# Patient Record
Sex: Male | Born: 1955 | Race: Black or African American | Hispanic: No | Marital: Married | Smoking: Never smoker
Health system: Southern US, Community
[De-identification: ages and names within clinical notes are randomized; demographics above are authoritative.]

## PROBLEM LIST (undated history)

## (undated) DIAGNOSIS — E119 Type 2 diabetes mellitus without complications: Secondary | ICD-10-CM

## (undated) DIAGNOSIS — I10 Essential (primary) hypertension: Secondary | ICD-10-CM

## (undated) DIAGNOSIS — N189 Chronic kidney disease, unspecified: Secondary | ICD-10-CM

## (undated) DIAGNOSIS — F329 Major depressive disorder, single episode, unspecified: Secondary | ICD-10-CM

## (undated) DIAGNOSIS — E785 Hyperlipidemia, unspecified: Secondary | ICD-10-CM

## (undated) DIAGNOSIS — I639 Cerebral infarction, unspecified: Secondary | ICD-10-CM

## (undated) DIAGNOSIS — F32A Depression, unspecified: Secondary | ICD-10-CM

## (undated) HISTORY — DX: Essential (primary) hypertension: I10

## (undated) HISTORY — DX: Major depressive disorder, single episode, unspecified: F32.9

## (undated) HISTORY — PX: BACK SURGERY: SHX140

## (undated) HISTORY — DX: Depression, unspecified: F32.A

## (undated) HISTORY — DX: Chronic kidney disease, unspecified: N18.9

---

## 2000-10-29 ENCOUNTER — Inpatient Hospital Stay (HOSPITAL_COMMUNITY): Admission: EM | Admit: 2000-10-29 | Discharge: 2000-10-31 | Payer: Self-pay | Admitting: Emergency Medicine

## 2002-07-16 ENCOUNTER — Emergency Department (HOSPITAL_COMMUNITY): Admission: EM | Admit: 2002-07-16 | Discharge: 2002-07-16 | Payer: Self-pay | Admitting: Emergency Medicine

## 2005-01-20 ENCOUNTER — Ambulatory Visit: Payer: Self-pay | Admitting: Internal Medicine

## 2005-03-07 ENCOUNTER — Ambulatory Visit: Payer: Self-pay | Admitting: Internal Medicine

## 2005-03-31 ENCOUNTER — Ambulatory Visit: Payer: Self-pay | Admitting: Internal Medicine

## 2005-04-18 ENCOUNTER — Encounter: Admission: RE | Admit: 2005-04-18 | Discharge: 2005-07-17 | Payer: Self-pay | Admitting: Internal Medicine

## 2005-05-06 ENCOUNTER — Ambulatory Visit: Payer: Self-pay | Admitting: Internal Medicine

## 2005-09-15 ENCOUNTER — Ambulatory Visit: Payer: Self-pay | Admitting: Internal Medicine

## 2006-06-16 ENCOUNTER — Encounter: Payer: Self-pay | Admitting: Internal Medicine

## 2006-06-16 ENCOUNTER — Ambulatory Visit: Payer: Self-pay | Admitting: Internal Medicine

## 2006-09-14 ENCOUNTER — Ambulatory Visit: Payer: Self-pay | Admitting: Internal Medicine

## 2006-09-14 LAB — CONVERTED CEMR LAB
BUN: 23 mg/dL (ref 6–23)
Cholesterol: 208 mg/dL (ref 0–200)
Total CHOL/HDL Ratio: 4.7
Triglycerides: 236 mg/dL (ref 0–149)

## 2007-01-09 ENCOUNTER — Encounter: Payer: Self-pay | Admitting: Internal Medicine

## 2007-02-20 ENCOUNTER — Encounter: Payer: Self-pay | Admitting: Internal Medicine

## 2007-02-20 ENCOUNTER — Ambulatory Visit: Payer: Self-pay | Admitting: Internal Medicine

## 2007-03-21 ENCOUNTER — Ambulatory Visit: Payer: Self-pay | Admitting: Internal Medicine

## 2007-03-25 LAB — CONVERTED CEMR LAB
ALT: 37 units/L (ref 0–53)
AST: 31 units/L (ref 0–37)
Creatinine, Ser: 1.3 mg/dL (ref 0.4–1.5)
Hgb A1c MFr Bld: 6.3 % — ABNORMAL HIGH (ref 4.6–6.0)
Total CHOL/HDL Ratio: 5.7
VLDL: 38 mg/dL (ref 0–40)

## 2007-03-26 ENCOUNTER — Encounter (INDEPENDENT_AMBULATORY_CARE_PROVIDER_SITE_OTHER): Payer: Self-pay | Admitting: *Deleted

## 2007-06-04 ENCOUNTER — Ambulatory Visit: Payer: Self-pay | Admitting: Internal Medicine

## 2007-06-04 ENCOUNTER — Encounter: Payer: Self-pay | Admitting: Internal Medicine

## 2007-10-03 ENCOUNTER — Ambulatory Visit: Payer: Self-pay | Admitting: Internal Medicine

## 2007-10-03 ENCOUNTER — Encounter: Payer: Self-pay | Admitting: Internal Medicine

## 2007-10-03 LAB — CONVERTED CEMR LAB: LDL Goal: 100 mg/dL

## 2007-10-05 ENCOUNTER — Encounter (INDEPENDENT_AMBULATORY_CARE_PROVIDER_SITE_OTHER): Payer: Self-pay | Admitting: *Deleted

## 2007-10-05 ENCOUNTER — Telehealth (INDEPENDENT_AMBULATORY_CARE_PROVIDER_SITE_OTHER): Payer: Self-pay | Admitting: *Deleted

## 2007-10-05 LAB — CONVERTED CEMR LAB
Creatinine, Ser: 1.4 mg/dL (ref 0.4–1.5)
Hgb A1c MFr Bld: 6.5 % — ABNORMAL HIGH (ref 4.6–6.0)
Potassium: 3.7 meq/L (ref 3.5–5.1)

## 2007-10-09 ENCOUNTER — Encounter (INDEPENDENT_AMBULATORY_CARE_PROVIDER_SITE_OTHER): Payer: Self-pay | Admitting: *Deleted

## 2007-10-18 ENCOUNTER — Ambulatory Visit (HOSPITAL_COMMUNITY): Admission: RE | Admit: 2007-10-18 | Discharge: 2007-10-18 | Payer: Self-pay | Admitting: Internal Medicine

## 2007-10-24 ENCOUNTER — Ambulatory Visit: Payer: Self-pay | Admitting: Cardiology

## 2007-10-29 ENCOUNTER — Encounter: Payer: Self-pay | Admitting: Pulmonary Disease

## 2007-11-05 ENCOUNTER — Ambulatory Visit (HOSPITAL_BASED_OUTPATIENT_CLINIC_OR_DEPARTMENT_OTHER): Admission: RE | Admit: 2007-11-05 | Discharge: 2007-11-05 | Payer: Self-pay | Admitting: *Deleted

## 2007-11-09 ENCOUNTER — Ambulatory Visit: Payer: Self-pay

## 2007-11-09 ENCOUNTER — Encounter: Payer: Self-pay | Admitting: Internal Medicine

## 2007-11-13 ENCOUNTER — Ambulatory Visit: Payer: Self-pay | Admitting: Pulmonary Disease

## 2007-11-19 ENCOUNTER — Ambulatory Visit (HOSPITAL_BASED_OUTPATIENT_CLINIC_OR_DEPARTMENT_OTHER): Admission: RE | Admit: 2007-11-19 | Discharge: 2007-11-19 | Payer: Self-pay | Admitting: General Surgery

## 2007-11-19 ENCOUNTER — Encounter (INDEPENDENT_AMBULATORY_CARE_PROVIDER_SITE_OTHER): Payer: Self-pay | Admitting: General Surgery

## 2007-11-20 ENCOUNTER — Encounter: Payer: Self-pay | Admitting: Internal Medicine

## 2007-12-11 ENCOUNTER — Ambulatory Visit: Payer: Self-pay | Admitting: Pulmonary Disease

## 2007-12-11 ENCOUNTER — Encounter: Payer: Self-pay | Admitting: Pulmonary Disease

## 2007-12-13 ENCOUNTER — Ambulatory Visit: Payer: Self-pay | Admitting: Cardiology

## 2008-01-10 ENCOUNTER — Ambulatory Visit: Payer: Self-pay | Admitting: Pulmonary Disease

## 2008-01-10 ENCOUNTER — Encounter: Payer: Self-pay | Admitting: Pulmonary Disease

## 2008-03-11 ENCOUNTER — Ambulatory Visit: Payer: Self-pay | Admitting: Cardiology

## 2008-07-10 ENCOUNTER — Ambulatory Visit: Payer: Self-pay | Admitting: Internal Medicine

## 2008-07-10 ENCOUNTER — Encounter (INDEPENDENT_AMBULATORY_CARE_PROVIDER_SITE_OTHER): Payer: Self-pay | Admitting: *Deleted

## 2008-07-15 ENCOUNTER — Encounter (INDEPENDENT_AMBULATORY_CARE_PROVIDER_SITE_OTHER): Payer: Self-pay | Admitting: *Deleted

## 2008-07-15 LAB — CONVERTED CEMR LAB
Creatinine,U: 201.9 mg/dL
Hgb A1c MFr Bld: 6.6 % — ABNORMAL HIGH (ref 4.6–6.0)
Microalb Creat Ratio: 26.3 mg/g (ref 0.0–30.0)
Microalb, Ur: 5.3 mg/dL — ABNORMAL HIGH (ref 0.0–1.9)
Potassium: 3.8 meq/L (ref 3.5–5.1)

## 2008-10-06 ENCOUNTER — Telehealth: Payer: Self-pay | Admitting: Family Medicine

## 2009-03-20 ENCOUNTER — Encounter (INDEPENDENT_AMBULATORY_CARE_PROVIDER_SITE_OTHER): Payer: Self-pay | Admitting: *Deleted

## 2009-04-10 ENCOUNTER — Ambulatory Visit: Payer: Self-pay | Admitting: Internal Medicine

## 2009-04-10 ENCOUNTER — Encounter: Payer: Self-pay | Admitting: Internal Medicine

## 2009-04-10 LAB — CONVERTED CEMR LAB: Potassium: 3.8 meq/L (ref 3.5–5.1)

## 2009-04-13 ENCOUNTER — Encounter (INDEPENDENT_AMBULATORY_CARE_PROVIDER_SITE_OTHER): Payer: Self-pay | Admitting: *Deleted

## 2009-08-17 ENCOUNTER — Emergency Department (HOSPITAL_COMMUNITY): Admission: EM | Admit: 2009-08-17 | Discharge: 2009-08-17 | Payer: Self-pay | Admitting: Emergency Medicine

## 2010-07-30 ENCOUNTER — Telehealth (INDEPENDENT_AMBULATORY_CARE_PROVIDER_SITE_OTHER): Payer: Self-pay | Admitting: *Deleted

## 2010-08-12 NOTE — Progress Notes (Signed)
Summary: clonidine refill   Phone Note Refill Request Message from:  Fax from Pharmacy on July 30, 2010 10:14 AM  Refills Requested: Medication #1:  CLONIDINE 0.1 MG TAB   Notes: Take one tablet by mouth twice daily [id number], [address] [place], [place], [place]  phone--not listed, Fax  = [phone]   qty =60     ****NOTE****   this fax was originally sent to Dr Rollene Rotunda and then the prescription was forwarded to our facility  Next Appointment Scheduled: Fri 2/3  Alwyn Ren Initial call taken by: Jerolyn Shin,  July 30, 2010 10:17 AM  Follow-up for Phone Call        Left message on machine for patient to return call when avaliable, Reason for call:   Clonidine not on med list, ? if he is taking Follow-up by: Shonna Chock CMA,  July 30, 2010 10:22 AM  Additional Follow-up for Phone Call Additional follow up Details #1::        Left message on machine for patient to return call when avaliable, Reason for call:  Discuss refill request Additional Follow-up by: Shonna Chock CMA,  July 30, 2010 4:47 PM    Additional Follow-up for Phone Call Additional follow up Details #2::    Patient called to indicate this was requested in error and he is no longer taking med. I called the pharmacy and left message on voicemail informing them to remove med from patient's list Follow-up by: Shonna Chock CMA,  August 02, 2010 8:12 AM  fr

## 2010-08-13 ENCOUNTER — Encounter: Payer: Self-pay | Admitting: Internal Medicine

## 2010-08-13 ENCOUNTER — Ambulatory Visit (INDEPENDENT_AMBULATORY_CARE_PROVIDER_SITE_OTHER): Payer: PRIVATE HEALTH INSURANCE | Admitting: Internal Medicine

## 2010-08-13 ENCOUNTER — Ambulatory Visit: Admit: 2010-08-13 | Payer: Self-pay | Admitting: Internal Medicine

## 2010-08-13 DIAGNOSIS — I10 Essential (primary) hypertension: Secondary | ICD-10-CM

## 2010-08-13 DIAGNOSIS — E119 Type 2 diabetes mellitus without complications: Secondary | ICD-10-CM

## 2010-08-16 LAB — CONVERTED CEMR LAB
Creatinine, Ser: 1.66 mg/dL — ABNORMAL HIGH (ref 0.40–1.50)
Hgb A1c MFr Bld: 6.1 % — ABNORMAL HIGH (ref <5.7)

## 2010-08-18 NOTE — Assessment & Plan Note (Signed)
Summary: MED REFILL/DIDN'T WANT TO SCHEDULE CPX/WANTED APPT. ASAP/RESCH F   Vital Signs:  Patient profile:   55 year old male Weight:      272.2 pounds BMI:     34.15 Pulse rate:   72 / minute Resp:     15 per minute BP sitting:   158 / 100  (left arm) Cuff size:   large  Vitals Entered By: Shonna Chock CMA (August 13, 2010 3:07 PM) CC: 1.) Renew RX's, patient stopped Carvedilol (too strong)  2.) Right hand/shoulder pain x 6 months or longer, Type 2 diabetes mellitus follow-up   Primary Care Provider:  Dr Alwyn Ren  CC:  1.) Renew RX's, patient stopped Carvedilol (too strong)  2.) Right hand/shoulder pain x 6 months or longer, and Type 2 diabetes mellitus follow-up.  History of Present Illness: Hypertension Follow-Up      This is a 55 year old man who presents for Hypertension follow-up.  The patient reports lightheadedness, urinary frequency, headaches, and fatigue, but denies edema.  The patient denies the following associated symptoms: exertional chest pain, exercise intolerance, dyspnea, palpitations, and syncope.  The patient reports that dietary compliance has been good.  The patient reports exercising 2 X per week.  Adjunctive measures currently used by the patient include salt restriction.   BP not checked @ home. He had not been been taken Carvedilol ; "it was too strong for me". Type 2 Diabetes Mellitus Follow-Up      The patient is also here for Type 2 diabetes mellitus follow-up.  The patient reports numbness of  toes occasionally , but denies polydipsia, blurred vision, self managed hypoglycemia, weight loss, and weight gain.  The patient denies the following symptoms: neuropathic pain, vomiting, poor wound healing, intermittent claudication, vision loss, and foot ulcer.  Since the last visit the patient reports not monitoring blood glucose.  Since the last visit, the patient reports having had no eye care.    Current Medications (verified): 1)  Hydrochlorothiazide 25 Mg Tabs  (Hydrochlorothiazide) .... Take One Half Daily **appointment Due** 2)  Fluoxetine Hcl 20 Mg  Caps (Fluoxetine Hcl) .Marland Kitchen.. 1 in The Morning 3)  Metformin Hcl 500 Mg  Tabs (Metformin Hcl) .Marland Kitchen.. 1 Daily 4)  Benazepril Hcl 40 Mg  Tabs (Benazepril Hcl) .... **appointment Due Now** 1 By Mouth Once Daily  Allergies (verified): No Known Drug Allergies  Past History:  Past Medical History: Chest pain 2002; negative  for MI Nonspecific Abnormal EKG Hypertension Depressive Disorder Abnormal Blood Chemistry Diabetes Sleep Apnea  Social History: no diet Patient never smoked.  Pt is married with children.  Pt working as Customer service manager   Physical Exam  General:  well-nourished,in no acute distress; alert,appropriate and cooperative throughout examination Eyes:  No corneal or conjunctival inflammation noted. Perrla. Funduscopic exam benign, without hemorrhages, exudates or papilledema.Arteriolar narrowing Heart:  Normal rate and regular rhythm. S1 and S2 normal without gallop, murmur, click, rub. S4 Abdomen:  Bowel sounds positive,abdomen soft and non-tender without masses, organomegaly or hernias noted. No AAA or bruits Pulses:  R and L carotid,radial,dorsalis pedis and posterior tibial pulses are full and equal bilaterally Extremities:  No clubbing, cyanosis, edema. Chronic toenail changes Neurologic:  alert & oriented X3 and sensation   to light touch decreased R foot Skin:  Intact without suspicious lesions or rashes Psych:  memory intact for recent and remote, normally interactive, and good eye contact.   Quiet & polite   Impression & Recommendations:  Problem # 1:  HYPERTENSION (ICD-401.9)  uncontrolled The following medications were removed from the medication list:    Carvedilol 25 Mg Tabs (Carvedilol) .Marland Kitchen... 1 bid His updated medication list for this problem includes:    Hydrochlorothiazide 25 Mg Tabs (Hydrochlorothiazide) .Marland Kitchen... Take one half daily     Benazepril Hcl 40 Mg Tabs  (Benazepril hcl) .Marland Kitchen... 1 by mouth once daily    Carvedilol 25 Mg Tabs (Carvedilol) .Marland Kitchen..Marland Kitchen Two times a day as directe ; goal = bp < 135/85  Orders: Venipuncture (78295) TLB-Creatinine, Blood (82565-CREA) TLB-Potassium (K+) (84132-K) TLB-BUN (Urea Nitrogen) (84520-BUN)  Problem # 2:  DIABETES MELLITUS (ICD-250.00)  His updated medication list for this problem includes:    Metformin Hcl 500 Mg Tabs (Metformin hcl) .Marland Kitchen... 1 daily    Benazepril Hcl 40 Mg Tabs (Benazepril hcl) .Marland Kitchen... 1 by mouth once daily  Orders: TLB-A1C / Hgb A1C (Glycohemoglobin) (83036-A1C)  Complete Medication List: 1)  Hydrochlorothiazide 25 Mg Tabs (Hydrochlorothiazide) .... Take one half daily **appointment due** 2)  Fluoxetine Hcl 20 Mg Caps (Fluoxetine hcl) .Marland Kitchen.. 1 in the morning 3)  Metformin Hcl 500 Mg Tabs (Metformin hcl) .Marland Kitchen.. 1 daily 4)  Benazepril Hcl 40 Mg Tabs (Benazepril hcl) .Marland Kitchen.. 1 by mouth once daily 5)  Carvedilol 25 Mg Tabs (Carvedilol) .... Two times a day as directe ; goal = bp < 135/85  Patient Instructions: 1)  Refill  of HCTZ & Metformin will depend on lab results. Start with 1/4 pill of  Carvedilol 25 mg two times a day . 2)  See your eye doctor yearly to check for diabetic eye damage. 3)  Check your feet each night for sore areas, calluses or signs of infection. 4)  Check your Blood Pressure regularly. If it is above: 135/85 ON AVERAGE  you should  increase Carvedilol to 1/2 two times a day . Prescriptions: CARVEDILOL 25 MG TABS (CARVEDILOL) two times a day as directe ; goal = BP < 135/85  #180 x 0   Entered and Authorized by:   Marga Melnick MD   Signed by:   Marga Melnick MD on 08/13/2010   Method used:   Print then Give to Patient   RxID:   [id number] BENAZEPRIL HCL 40 MG  TABS (BENAZEPRIL HCL) 1 by mouth once daily  #90 x 3   Entered and Authorized by:   Marga Melnick MD   Signed by:   Marga Melnick MD on 08/13/2010   Method used:   Print then Give to Patient   RxID:    [id number] FLUOXETINE HCL 20 MG  CAPS (FLUOXETINE HCL) 1 in the morning  #90 x 3   Entered and Authorized by:   Marga Melnick MD   Signed by:   Marga Melnick MD on 08/13/2010   Method used:   Print then Give to Patient   RxID:   [id number]    Orders Added: 1)  Est. Patient Level III [87564] 2)  Venipuncture [33295] 3)  TLB-Creatinine, Blood [82565-CREA] 4)  TLB-Potassium (K+) [84132-K] 5)  TLB-BUN (Urea Nitrogen) [84520-BUN] 6)  TLB-A1C / Hgb A1C (Glycohemoglobin) [83036-A1C]  Appended Document: MED REFILL/DIDN'T WANT TO SCHEDULE CPX/WANTED APPT. ASAP/RESCH F

## 2010-11-23 NOTE — Assessment & Plan Note (Signed)
[facility]                            CARDIOLOGY OFFICE NOTE   ARDEN, TINOCO                   MRN:          [id number]  DATE:03/11/2008                            DOB:          [birth year]    PRIMARY CARE PHYSICIAN:  Titus Dubin. Alwyn Ren, MD, FACP, FCCP   REASON FOR PRESENTATION:  Evaluate the patient with difficult-to-control  hypertension.   HISTORY OF PRESENT ILLNESS:  The patient is 55 years old.  He presents  for followup of the above.  Since I last saw him, he has been using a  CPAP and thinks it has made a great deal of difference.  He has more  energy and sleeps better.  He is not having any new cardiovascular  symptoms.  He has no chest discomfort, neck or arm discomfort.  He does  not have any palpitations, presyncope or syncope.  He has unfortunately  not been dieting and has actually gained weight since I last saw him.  He does get some reflux of acid, but no other chest discomfort.   PAST MEDICAL HISTORY:  Hypertension x7 years, diabetes mellitus x1 year,  severe sleep apnea newly diagnosed, lipoma resected.   ALLERGIES:  None.   MEDICATIONS:  1. Fluoxetine 20 mg daily.  2. Metformin 500 mg daily.  3. Coreg 25 mg b.i.d.  4. Benazepril 40 mg daily.  5. Hydrochlorothiazide 12.5 mg daily.   REVIEW OF SYSTEMS:  As stated in the HPI and otherwise negative for  other systems.   PHYSICAL EXAMINATION:  GENERAL:  The patient is in no distress.  VITAL SIGNS:  Her blood pressure 158/106, heart rate 72 and regular, and  weight 289 pounds.  HEENT:  Eyes unremarkable; pupils equal, round and reactive to light;  fundi not visualized; oral mucosa unremarkable.  NECK:  No jugular venous distension at 45 degrees; carotid upstroke  brisk and symmetrical; no bruits, no thyromegaly.  LYMPHATICS:  No cervical, axillary or inguinal adenopathy.  LUNGS:  Clear to auscultation bilaterally.  BACK:  No costovertebral angle tenderness.  CHEST:   Unremarkable.  HEART:  PMI not displaced or sustained; S1 and S2 within normal limits;  no S3, no S4; no clicks, no rubs, no murmurs.  ABDOMEN:  Obese; positive bowel sounds, normal in frequency and pitch;  no bruits, no rebound, no guarding; no midline pulsatile mass, no  hepatosplenomegaly, no splenomegaly.  SKIN: No rashes, no nodules.  EXTREMITIES:  2+ pulses, no edema.   EKG sinus rhythm, rate 68, axis within normal limits, intervals within  normal limits, no acute ST-T wave changes.   ASSESSMENT AND PLAN:  1. Hypertension.  The patient's blood pressure is better, but still      not at target.  We have discussed therapeutic lifestyle changes      (TLC).  I am going to restart clonidine at 0.1 mg b.i.d. in      addition to his other medications.  I have reviewed his labs, and      he has had normal BUN and creatinine and it does not appear to be a  secondary cause for his hypertension.  2. Diabetes, per Dr. Alwyn Ren.  3. Sleep apnea.  The patient is on CPAP and continues to have this      titrated.  I am delighted that this is making him feel better.      Weight loss will also help.  4. Obesity.  The patient and I discussed weight loss.  I have given      him instructions to cut out potatoes, breads, and Second Helpings.      He needs to start exercising more and he does have a bicycle that      he brought and he is starting to peddle it in his home.  5. Followup.  I will see him back in about 6 weeks to follow up on his      blood pressure.     Rollene Rotunda, MD, [facility]  Electronically Signed    JH/MedQ  DD: 03/11/2008  DT: 03/11/2008  Job #: [id number]   cc:   Titus Dubin. Alwyn Ren, MD,FACP,FCCP

## 2010-11-23 NOTE — Assessment & Plan Note (Signed)
[facility]                            CARDIOLOGY OFFICE NOTE   [name], GATLEY                   MRN:          [id number]  DATE:10/24/2007                            DOB:          1956-01-21    PRIMARY:  Chris Vaughn. Alwyn Ren, MD.   REASON FOR PRESENTATION:  Evaluate patient with chest pain and difficult  to control hypertension.   HISTORY OF PRESENT ILLNESS:  The patient is a pleasant, 55 year old  African-American gentleman without prior cardiac history.  He did  recently begin to complain of some chest discomfort.  He was actually  set up to have an exercise stress test, but his blood pressure was 105  diastolic, and so this did not take place.  Instead he is referred here  for evaluation of his chest pain, and his difficult-to-control  hypertension.   The patient says that he has been having chest pain for quite awhile.  However, it is rarely happening now.  It was happening a few times a  week.  He does not remember the last episode.  He points to his left  upper chest.  He gets this discomfort if he walks a lot at work, or does  have any heavy lifting.  It may rarely happen at rest.  He says it is  somewhat sharp.  He does not describe any radiation into his neck or  into his arms.  There is no associated nausea, vomiting, or diaphoresis.  He has had no palpitation, presyncope, or syncope.  He does not think  that this was like any previous complaints that he has had before.  It  may last 3-4 minutes before going away spontaneously.  He will  occasionally get short of breath.  This may happen with activity such as  mowing the lawn.  It may happen occasionally at rest, though he is not  describing PND or orthopnea.  Of note, the patient does snore loudly.  His wife thinks that he has apneic episodes.  He reports daytime  sleepiness and occasional headaches.  He has had difficult-to-control  hypertension.   PAST MEDICAL HISTORY:   Diabetes mellitus x1 year, hypertension x7 years.   PAST SURGICAL HISTORY:  Fatty tumor removed.   ALLERGIES:  None.   MEDICATIONS:  1. Hydrochlorothiazide 12.5 mg daily.  2. Clonidine 0.2 mg b.i.d.  3. Metformin 500 mg daily.  4. Fluoxetine.  5. Benazepril 40 mg daily.   SOCIAL HISTORY:  The patient is married.  He has 3 children.  He does  not smoke cigarettes or drink alcohol.   FAMILY HISTORY:  Noncontributory for early coronary disease.  He thinks  his mom had heart disease in her 36s, and finally died of a heart attack  at age 48.   REVIEW OF SYSTEMS:  As stated in the HPI.  Negative for all other  systems.   PHYSICAL EXAMINATION:  The patient is pleasant and in no distress.  Blood pressure 149/105, heart rate 75 regular.  Weight 284 pounds, body  mass index 35.  HEENT:  Eyelids unremarkable.  Pupils equal, round, and react to  light.  Fundi within normal limits, oral mucosa unremarkable, macroglossia.  NECK:  No jugular distention at 45 degrees.  Carotid upstroke brisk and  symmetric.  No bruits, no thyromegaly.  LYMPHATICS:  No cervical, axillary, or inguinal adenopathy.  LUNGS:  Clear to auscultation bilaterally.  BACK:  No costovertebral angle tenderness.  CHEST:  Unremarkable.  HEART:  PMI not displaced or sustained.  S1 and S2 within normal limits.  No S3, no S4, no clicks, no rubs, no murmurs.  ABDOMEN:  Obese, positive bowel sounds normal in frequency and pitch.  No bruits, no rebound, no guarding, or midline pulsatile mass.  No  hepatomegaly or splenomegaly.  SKIN:  No rashes, no nodules.  EXTREMITIES:  2+ pulse throughout.  No edema, no cyanosis, no clubbing.  NEURO:  Oriented to person, place, and time.  Cranial nerves II-XII  grossly intact.  Motor grossly intact.   EKG:  Sinus rhythm, left ventricle hypertrophy by voltage criteria, axis  within normal limits, intervals within normal limits, no acute ST-wave  changes.   ASSESSMENT/PLAN:  1. Chest.   The patient's chest pain is somewhat atypical; however, he      has significant cardiovascular risk factors.  I would like for him      to have a stress perfusion study as the pretest probability of      obstructive coronary disease is somewhat moderate.  I think that he      would be able to walk on a treadmill, if his blood pressure allows.      Otherwise, we will schedule this for adenosine.  2. Hypertension.  His blood pressure is difficult to control.  We      reviewed therapeutic lifestyle changes (TLC).  This should include      weight loss and salt restriction, and increased activity.  For now,      I am also going to increase his clonidine to 0.3 mg b.i.d..      Finally, I have very much believe that he has sleep apnea.  We will      do a sleep study.  Between therapeutic lifestyle changes, and      treatment of sleep apnea, we may be able to cut back on medicines      going forward.  3. Obesity.  We discussed the need to lose weight with diet and      exercise.  4. Diabetes mellitus per Dr. Alwyn Ren.  5. Risk reduction.  The patient should be on a statin.  I will take      the liberty of starting pravastatin 20 mg daily.  The goal would be      an LDL of less than 100 and HDL greater than 40.  We can repeat a      lipid profile going forward.  6. Followup.  I would like to see him back in about a month, and      hopefully he will have the sleep study and the nuclear study by      then.     Rollene Rotunda, MD, [facility]  Electronically Signed    JH/MedQ  DD: 10/24/2007  DT: 10/24/2007  Job #: [id number]   cc:   Chris Vaughn. Alwyn Ren, MD,FACP,FCCP

## 2010-11-23 NOTE — Op Note (Signed)
[name], Chris Vaughn            ACCOUNT NO.:  [id number]   MEDICAL RECORD NO.:  [id number]          PATIENT TYPE:  AMB   LOCATION:  DSC                          FACILITY:  [facility]   PHYSICIAN:  Chris Dare. Janee Vaughn, M.D.DATE OF BIRTH:  16-Aug-1955   DATE OF PROCEDURE:  11/19/2007  DATE OF DISCHARGE:                               OPERATIVE REPORT   PREOPERATIVE DIAGNOSIS:  Mass, right back, 10 cm.   POSTOPERATIVE DIAGNOSIS:  Mass, right back, 10 cm.   PROCEDURE:  Excision of mass, right back, 10 cm with layered closure.   SURGEON:  Chris Dare. Janee Vaughn, M.D.   ANESTHESIA:  General.   HISTORY OF PRESENT ILLNESS:  The patient is a 55 year old African  American gentleman who I evaluated in the office for an enlarging  symptomatic 10-cm mass on his right upper back.  He has a history of a  previous large lipoma excision from a different portion of his back in  the past.  This mass has gotten larger, and he presents today for  elective excision.   PROCEDURE IN DETAIL:  Informed consent was obtained.  The patient's site  was marked after identifying him in the preop holding area.  He received  intravenous antibiotics.  He was brought to the operating room.  General  endotracheal anesthesia was administered by the anesthesia staff.  He  was then placed in prone position while carefully protecting his  endotracheal tube positioning.  His back was prepped and draped in a  sterile fashion.  A 0.25% Marcaine with epinephrine was injected over  this palpable 10-cm mass.  An oblique incision along the tissue planes  was made over the top of the mass.  Subcutaneous tissues were dissected  down, and this revealed a mostly encapsulated fatty tumor.  This was  circumferentially dissected from the subcutaneous tissues and then off  the fascia underneath it, appeared to be sitting right on top of the  fascia.  Dissection was continued circumferentially to remove the entire  mass, which was sent to  pathology.  Meticulous hemostasis was then  obtained in the wound bed.  Some additional local anesthetic was  injected.  The area was copiously irrigated.  It was again checked for  hemostasis, which was present.  The wound was then closed in layers with  subcutaneous tissues, closed with interrupted 2-0 Vicryl sutures,  tacking these tissues down to the underlying fascia.  The skin was then  closed with running 4-0 Monocryl subcuticular stitch.  The wound was  then dressed with Dermabond.  Sponge, needle, and instrument counts were  correct.  The patient tolerated the procedure well without apparent  complication and was taken to recovery room in stable condition.     Chris Vaughn, M.D.  Electronically Signed    BET/MEDQ  D:  11/19/2007  T:  11/20/2007  Job:  [id number]   cc:   Chris Dubin. Alwyn Ren, MD,FACP,FCCP

## 2010-11-23 NOTE — Procedures (Signed)
Chris Vaughn, Chris Vaughn NO.:  [id number]   MEDICAL RECORD NO.:  [id number]          PATIENT TYPE:  OUT   LOCATION:  SLEEP CENTER                 FACILITY:  [facility]   PHYSICIAN:  Barbaraann Share, MD,FCCPDATE OF BIRTH:  11-[birth year]   DATE OF STUDY:  11/05/2007                            NOCTURNAL POLYSOMNOGRAM   REFERRING PHYSICIAN:  Rollene Rotunda, MD, [facility]   INDICATION FOR STUDY:  Hypersomnia with sleep apnea.   EPWORTH SLEEPINESS SCORE:  Is 16.   MEDICATIONS:   SLEEP ARCHITECTURE:  The patient had a total sleep time of 121 minutes  during the diagnostic portion of the study and 141 minutes during the  titration portion.  There was no slow wave sleep and only minimal REM  achieved during the entire night.  Sleep onset latency was normal at 22  minutes and REM was not achieved until the titration portion of the  study.  Sleep efficiency was 63% during the diagnostic portion and 56%  during the titration portion.   RESPIRATORY DATA:  The patient underwent a split night study where he  was found to have 157 obstructive events in the first 121 minutes of  sleep.  This gave him an apnea to hypopnea index of 78 events per hour  during the diagnostic portion of the study.  He was noted to have loud  snoring, and the events were not positional.  By protocol he was placed  on a large Quattro full face mask and titrated as high as 9 cm of H2O  pressure.  It should be noted that the patient had fairly good control  of his events on 7 cm, and had induction of central apneas at both 8 and  9 cm; therefore I would recommend the treatment pressure of 7 cm of H2O.   OXYGEN DATA:  There was O2 desaturation as low as 76% during the  diagnostic portion of the study.  This responded quite well to optimal  CPAP.   CARDIAC DATA:  No clinically significant cardiac arrhythmias were noted.   MOVEMENT/PARASOMNIA:  None.   IMPRESSIONS/RECOMMENDATIONS:  Split night study revealed  severe  obstructive sleep apnea/hypopnea syndrome with an apnea to hypopnea  index of 78 events per hour and O2 desaturation as low as 76% during the  diagnostic portion of the study.  The patient was then placed on CPAP  with a large Quattro full face mask, and his  optimal pressure was found to be 7 cm of H2O.  The patient should also  be encouraged to work aggressively on weight loss.      Barbaraann Share, MD,FCCP  Diplomate, American Board of Sleep  Medicine  Electronically Signed     KMC/MEDQ  D:  11/13/2007 17:30:21  T:  11/13/2007 17:57:00  Job:  [id number]

## 2010-11-23 NOTE — Assessment & Plan Note (Signed)
[facility]                            CARDIOLOGY OFFICE NOTE   KAYLEE, WOMBLES                   MRN:          [id number]  DATE:12/13/2007                            DOB:          [birth year]    PRIMARY CARE PHYSICIAN:  Titus Dubin. Alwyn Ren, MD, FACP, FCCP   REASON FOR PRESENTATION:  Evaluate patient with difficult to control  hypertension.   HISTORY OF PRESENT ILLNESS:  The patient is a pleasant 55 year old  gentleman with hypertension that has been difficult to control.  When I  last saw him, I increased his clonidine.  I also sent him for sleep  study.  He was found to have severe sleep apnea and has seen Dr. Shelle Iron.  He is now being started on the CPAP.  Because his blood pressure was not  controlled on the clonidine, he has been switched to Coreg 25 mg twice a  day and the clonidine stopped.  He is just about to start this today.  He has no new complaints.  He has no new shortness of breath, PND or  orthopnea.  There is no palpitations, pre-syncope or syncope.   Finally, the patient did complain of some chest discomfort and tightness  with activity.  I have sent him for stress perfusion study on May 1,  which demonstrated an EF 52%, no evidence of ischemia or infarction.   PAST MEDICAL HISTORY:  1. Hypertension x7 years.  2. Diabetes mellitus x1 year.  3. Newly diagnosed severe sleep apnea.  4. Fatty tumor removed.   ALLERGIES:  None.   MEDICATIONS:  1. Fluoxetine 20 mg daily.  2. Metformin 500 mg daily.  3. Coreg 20 mg b.i.d.  4. Benazepril 40 mg daily.  5. Hydrochlorothiazide 25 mg daily.  6. Pravastatin 20 mg daily.   REVIEW OF SYSTEMS:  As stated in HPI and otherwise negative for other  systems.   PHYSICAL EXAMINATION:  The patient is in no distress.  Blood pressure  172/100, heart rate 77 and regular, weight 285 pounds, body mass index  35.  NECK:  No jugular distention at 45 degrees, carotid upstroke brisk  and  symmetrical.  No bruits, thyromegaly.  LYMPHATICS: No nodes.  LUNGS:  Clear to auscultation bilaterally.  CHEST:  Unremarkable.  HEART:  PMI not displaced or sustained, S1-S2 within normal limits, no  S3-S4, clicks, rubs, murmurs.  ABDOMEN:  Flat, positive bowel sounds, normal in frequency and pitch, no  bruits, no rebound, no guarding and no midline pulsatile mass. No  organomegaly.  EXTREMITIES:  2+ pulse, no edema.  NEURO:  Grossly intact.   1. Hypertension.  Blood pressure is not well-controlled.  However, he      is just about to start carvedilol, which he has not yet started.  I      will let him be on this regimen with clonidine being discontinued.      Again, I emphasized weight loss.  He is also now about to get his      sleep apnea treated.  With all of these changes,  I hope his blood  pressure begins to normalize.  He may however need the reinitiation      of clonidine in addition to everything else, but will give the      current therapies a chance to work.  2. Sleep apnea, as above.  3. Chest pain.  He had no evidence of ischemia on his Cardiolite.      There will be no further ischemia workup at this point.  He can go      back to Dr. Alwyn Ren to discuss this.  4. Followup.  I will see him back in three months to see if we need to      make another med change.     Rollene Rotunda, MD, [facility]  Electronically Signed    JH/MedQ  DD: 12/13/2007  DT: 12/13/2007  Job #: [id number]   cc:   Titus Dubin. Alwyn Ren, MD,FACP,FCCP

## 2010-11-23 NOTE — Procedures (Signed)
[place] [facility]                              EXERCISE TREADMILL   ARGIE, APPLEGATE                   MRN:          [id number]  DATE:10/18/2007                            DOB:          10/10/1955    SUMMARY OF HISTORY:  Mr. Cardiff is a 55 year old, African American  male who was referred by Dr. Alwyn Ren for CPX testing for evaluation of  chest discomfort and shortness of breath.  Mr. Cromley is for CPX  testing.  Baseline blood pressure was approximately 170/111.  Over the  next 30 minutes, several blood pressures were obtained, the last being  170/115.   Per CPX protocol, it is inadvisable to ambulate somebody with a systolic  blood pressure greater than 110.  This was discussed with Dr. Gala Romney  and he agree to follow protocol.  On further evaluation of Mr.  Klasen chest discomfort, he states that since Thanksgiving with the  death of his mother, he has been under an unordinary amount of stress,  with work, the economy, his children possibly losing their jobs and  moving back home.  He has awakened several times during the night with  some chest discomfort followed by his shortness of breath, however, he  states he does not necessarily have any exertional symptoms unless it is  an incredibly heavy type of exertion.  He does not get any symptoms with  low level exercise.  He cannot describe the frequency or the duration of  the discomfort or any specific alleviating or aggravating factors.  Dr.  Alwyn Ren, after evaluating the patient on October 03, 2007, ordered a CPX  testing.   I spoke with Dr. Alwyn Ren in regards to his hypertension and the protocol  not to ambulate somebody with a diastolic blood pressure greater than  110 and he concurs.  After discussing with Dr. Alwyn Ren, Dr. Alwyn Ren  requested that an appointment be made with the cardiologist for  evaluation of his chest discomfort and poorly-controlled hypertension.  This appointment was  made for the first available with Dr. Antoine Poche on  October 22, 2007, at approximately 3:45 p.m.  Mr. Bostock was advised if  he has any prolonged problems with chest discomfort or shortness of  breath, he should proceed to the nearest emergency room.  I have faxed  information provided from Dr. Frederik Pear office and copies of EKG that  were present at the time of potential CPX testing.      Joellyn Rued, PA-C  Electronically Signed      Rollene Rotunda, MD, [facility]  Electronically Signed   EW/MedQ  DD: 10/18/2007  DT: 10/18/2007  Job #: [id number]

## 2010-11-26 NOTE — Discharge Summary (Signed)
[place]. [facility]  Patient:    Chris Vaughn, Chris Vaughn                   MRN: [id number] Adm. Date:  [id number] Disc. Date: 10/31/00 Attending:  Dolores Patty Dictator:   Janora Norlander, N.P.                           Discharge Summary  ADMITTING DIAGNOSES: 1. Chest pain. 2. Rule out pulmonary embolism. 3. Elevated CPK. 4. Hypertension.  DISCHARGE DIAGNOSES: 1. Asthmatic bronchitis, chest pain probably related secondary to this. 2. Atypical left upper extremity symptoms with exercise. 3. Hypertension.  DISCHARGE MEDICATIONS: 1. Serevent diskus one inhalation every 12 hours. 2. Flovent 44 mcg four puffs every 12 hours 30 minutes after the Serevent    diskus. 3. Augmentin 875 mg one p.o. every 12 hours with food for the next 7 days. 4. Guaifenesin 1200 mg every 12 hours with eight ounces of water. 5. Protonix 40 mg one p.o. every morning. 6. Verapamil 240 mg daily. 7. Micardis 80/HCT 12.5 one p.o. daily. 8. Catapres TTS one patch weekly.  DIET:  No added salt.  SPECIAL INSTRUCTIONS:  The patient is to be out of work until November 06, 2000, due to his asthmatic bronchitis and dusty conditions at his job.  HISTORY OF PRESENT ILLNESS:  Chris Vaughn was admitted via the ER on October 29, 2000, complaining of three weeks of cough and congestion.  He works in a dusty environment and felt that this was aggravating his coughing.  He tried some over-the-counter cough medications, but did not improve.  Chest pain started approximately one week prior to admission and worsened.  The pain increased into his chest and throat.  The pain was variable from dull to sharp.  His initial vital signs were temperature 99.3, blood pressure 181/103, pulse 80. He had a regular respiratory rate with O2 at 97.  LABORATORY DATA AND X-RAY FINDINGS:  His laboratory revealed a CPK of 693 with a less than 1 relative index with troponin 0.01.  He had a H&H of 44 and  15 respectively.  His glucose was at 100.  ASSESSMENT/PLAN:  We decided to admit him to look at his atypical chest pain, bronchitis and rule out PE.  PROCEDURE:  Chest x-ray, 2D view, with no active disease.  The heart size and mediastinal contours were unremarkable.  The lungs were clear and the visualized skeleton was unremarkable.  Spiral CT rule out a PE.  HOSPITAL COURSE:  #1 - CARDIOLOGY:  He was placed on the telemetry unit and cardiology was consulted to initiate their evaluation including a Cardiolite stress test.  However, given his asthmatic bronchitis and his elevated blood pressures, we decided to hold on the Cardiolite stress test and schedule that as an outpatient.  #2 - HYPERTENSION:  His hypertensive medications were adjusted during this hospitalization and he is being discharged on the regimen above.  In addition, his antibiotics for his asthmatic bronchitis and inhalers were also adjusted during this hospitalization.  He is being discharged on the above medications.  DISCHARGE PHYSICAL EXAMINATION:  GENERAL:  The patient is complaining of yellow sputum. VITAL SIGNS:  He is afebrile, blood pressure 164/114, pulse 82.  On room air, his O2 saturation is 97.  LUNGS:  Bilateral rhonchi, left greater than right.  HEART:  Regular rate and rhythm.  ASSESSMENT: 1. Asthmatic bronchitis.  We think that his  chest pain is related to this.  We    will add Serevent diskus as above. 2. Atypical left upper extremity symptoms with exercise.  We will do an    outpatient stress Cardiolite after the asthmatic bronchitis is resolved and    the blood pressure is under better control.  At this point, undergoing the    Cardiolite might aggravate his bronchospasms.  CONDITION ON DISCHARGE:  Stable.  FOLLOWUP:  The patient is to have an office visit in two weeks and review all of his medications.  At this point, if he is stable with his asthmatic bronchitis and blood pressure, we  will schedule a Cardiolite stress test at the [facility] office. DD:  10/31/00 TD:  11/01/00 Job: [id number]

## 2010-11-26 NOTE — Letter (Signed)
September 17, 2006    Chris Vaughn  [address]  [place], [place] [address]   RE:  Chris Vaughn, Chris Vaughn  MRN:  [id number]  /  DOB:  14-Dec-1955   Dear Ethelene Browns:   It is always a pleasure to see you and your wife; I am always impressed  with your gentle manner and kindness.   I wanted to summarize the lab results that you should have already  received.  The total cholesterol was mildly elevated at 208, and the bad  cholesterol was 122.  The bad cholesterol, or LDL, should be less than  295, and ideally less than 75.  Your triglycerides of 236 indicate that  you are eating excess carbohydrates, which turn to sugar very quickly at  a very high rate.  I would recommend that you avoid the hyperglycemic  carbohydrates such as white potatoes (1 cup of sugar), [place] fries (2  teaspoons of sugar per [place] fry), and white bread (1 tablespoon of  sugar).  Instead, eat yams, wild rice, and wheat bread or pasta, or rye  bread.  Additionally, please read the label of any food or drink you  ingest.  If high fructose corn syrup is listed at the 1st, 2nd or 3rd  ingredient, you should pick a different choice.   Your A1c is an assessment of what your sugars averaged over the last 6  to 12 weeks.  It is 6.2, which means an average of 142, and a 25% risk  of heart attack or stroke.  A safe A1c would be 5.3 to 5.9.   Your kidney function is beginning to deteriorate.  You have  microalbuminuria, which is microscopic changes of diabetic kidney  disease manifested as protein in the urine.  Your creatinine of 1.6  indicates significant kidney impairment; it should be < 1.3. To prevent  progression of kidney disease your sugars will need to be controlled,  and you blood pressure will have to average less 130/80.   I would recommend that you drink 40 ounces of water a day.   Please make these dietary changes.  Please do not let your blood  pressure medicines lapse.   I would  recommend that in late July, after you have been following these  recommendations for over 3 months, that the fasting cholesterol panel,  A1c, and kidney function (BUN and creatinine) be repeated.   If you do restrict the white carbs  & high fructose corn syrup , I would  expect you to lose inches at your waist ; your energy level  as well as  your mental alertness to increase.  Additionally, I would expect you not  to stay hungry.   Please see me several days after those fasting labs have been drawn.    Sincerely,      Titus Dubin. Alwyn Ren, MD,FACP,FCCP  Electronically Signed    WFH/MedQ  DD: 09/17/2006  DT: 09/17/2006  Job #: [id number]

## 2010-11-26 NOTE — H&P (Signed)
[place]. [facility]  Patient:    Chris Vaughn, Chris Vaughn                   MRN: [id number] Adm. Date:  [id number] Attending:  Dolores Patty                         History and Physical  HISTORY OF PRESENT ILLNESS:  Mr. Mcmurtrey is a 55 year old Afro-American male admitted with chest pain and hemoptysis.  His symptoms actually began three weeks prior to admission in the context of cough and congestion.  He had exposures to dust and debris at his work environment where he is working in [employer].  He had yellow sputum with intermittent streaks of blood for which he had taken Tussend and NyQuil. The cough was daily, especially in the afternoon and at night.  Chest pain began approximately one week ago and was worse after coughing and was over the anterior chest and throat.  It was variable in intensity from dull to sharp and better with the ingestion of cool water.  It was not worse with exertion. He has, however, had some paresthesias of the left upper extremity in the past with exertion.  PAST MEDICAL HISTORY:  His past medical history includes excision of lipoma from his back.  MEDICATIONS:  Patient is on verapamil 240 mg daily.  ALLERGIES:  He has no known drug allergies.  SOCIAL HISTORY:  He quit smoking after less than one year at the age of 11. He drinks occasionally.  FAMILY HISTORY:  Family history reveals heart disease in his mother and open heart surgery and diabetes in his grandmother.  REVIEW OF SYSTEMS:  Review of systems is positive for dyspepsia occasionally. He does have some extrinsic rhino-conjunctivitis symptoms.  He also has occasional leg cramps.  PHYSICAL EXAMINATION:  GENERAL:  He is a well-nourished, muscularly developed, middle-aged Afro-American male in no acute distress.  He is slightly hoarse.  VITAL SIGNS:  Temperature is 99.3, blood pressure 181/103, pulse 80 and regular, respiratory rate 18.  O2  saturations are 97% on room air.  HEENT:  He has arteriolar narrowing on fundal exam.  TMs are dull.  There is erythema of the oropharynx.  Dental hygiene is good.  NODES:  He has no lymphadenopathy about the head, neck or axillae.  CARDIAC:  A grade 1.5 systolic murmur is present, loudest at the right base.  ABDOMEN:  Abdomen is soft and nontender without organomegaly or masses.  GENITOURINARY:   Exam is essentially deferred.  EXTREMITIES:  Pedal pulses are intact and there is no edema.  He has negative Homans sign.  NEUROLOGIC:  There are no localizing neurologic signs and no neuromuscular deficit.  LABORATORY DATA:  A chest x-ray and EKG are normal.  His CPK revealed a value of 693 with less than 1% relative index.  Troponin was less than 0.01.  An i-STAT was normal with a hematocrit of 44, hemoglobin of 15.  IMPRESSION:  He is now admitted with bronchitis with chest pain probably secondary to this and hemoptysis secondary to this.  There is an elevated CPK of questionable significance.  He also has exertional paresthesia of the left upper extremity by history.  He will be placed on telemetry.  He will also have a spiral CT to rule out pulmonary thromboemboli.  He will receive intravenous Rocephin and will be treated for rhino-conjunctivitis symptoms as well as reflux symptoms.  He will receive topical steroids.  Decongestants will be avoided because of his hypertension.  An angiotensin receptor blocker will be added to the calcium channel blocker because of inadequate control at this time. DD:  10/29/00 TD:  10/30/00 Job: [id number]

## 2011-04-06 LAB — BASIC METABOLIC PANEL
BUN: 19
Chloride: 101
GFR calc Af Amer: >60
GFR calc non Af Amer: 56 — ABNORMAL LOW
Potassium: 3.5

## 2011-06-27 ENCOUNTER — Other Ambulatory Visit: Payer: Self-pay | Admitting: Internal Medicine

## 2011-08-05 ENCOUNTER — Other Ambulatory Visit: Payer: Self-pay | Admitting: Internal Medicine

## 2011-08-05 MED ORDER — CARVEDILOL 25 MG PO TABS: ORAL_TABLET | ORAL | Status: DC

## 2011-08-05 NOTE — Telephone Encounter (Signed)
Patient due for annual exam in 08/2011 or after

## 2011-08-22 ENCOUNTER — Other Ambulatory Visit: Payer: Self-pay | Admitting: Internal Medicine

## 2011-08-22 NOTE — Telephone Encounter (Signed)
Patient needs to schedule a CPX  

## 2011-12-26 ENCOUNTER — Encounter: Payer: Self-pay | Admitting: *Deleted

## 2011-12-26 ENCOUNTER — Telehealth: Payer: Self-pay | Admitting: Internal Medicine

## 2011-12-26 MED ORDER — FLUOXETINE HCL 20 MG PO CAPS: ORAL_CAPSULE | ORAL | Status: DC

## 2011-12-26 MED ORDER — BENAZEPRIL HCL 40 MG PO TABS: ORAL_TABLET | ORAL | Status: DC

## 2011-12-26 NOTE — Telephone Encounter (Signed)
Last OV 08-13-10, no pending OV, last filled 08-22-11 #90

## 2011-12-26 NOTE — Telephone Encounter (Signed)
Give 30 days of each and schedule appointment

## 2011-12-26 NOTE — Telephone Encounter (Signed)
Rx sent, Letter Mail to Pt advising OV due. Pt phone # on file is not a working number.

## 2011-12-26 NOTE — Telephone Encounter (Signed)
Refill: Benazepril 40mg  tab. Take one tablet by mouth every day - appointment due. Qty 90. Last fill 09-05-11 Fluoxetine 20mg  cap. Take one capsule by mouth in the morning - appointment due. Qty 90. Last fill 09-14-11

## 2012-02-14 ENCOUNTER — Other Ambulatory Visit: Payer: Self-pay | Admitting: Internal Medicine

## 2012-02-14 NOTE — Telephone Encounter (Signed)
Patient needs to schedule a CPX  

## 2012-04-03 ENCOUNTER — Other Ambulatory Visit: Payer: Self-pay | Admitting: Internal Medicine

## 2012-04-06 ENCOUNTER — Other Ambulatory Visit: Payer: Self-pay | Admitting: Internal Medicine

## 2012-05-10 ENCOUNTER — Other Ambulatory Visit: Payer: Self-pay | Admitting: Internal Medicine

## 2012-05-10 NOTE — Telephone Encounter (Signed)
Last OV 08/13/2010, I called patient to schedule appointment and was told that number on file was invalid.   Dr.Hopper please advise on refill request

## 2012-05-10 NOTE — Telephone Encounter (Signed)
I tried to reach patient again at listed number, number invalid. I checked [employer] and called work number listed and was told no one works there by the name of patient.

## 2012-05-10 NOTE — Telephone Encounter (Signed)
he's extremely nice gentleman; unfortunately I cannot renew this medicine as he has not been seen for 20 months. I recommend that he go to an urgent care to check BP & have prescriptions refilled if indicated

## 2012-05-31 ENCOUNTER — Ambulatory Visit: Payer: PRIVATE HEALTH INSURANCE | Admitting: Family Medicine

## 2012-06-27 ENCOUNTER — Ambulatory Visit (INDEPENDENT_AMBULATORY_CARE_PROVIDER_SITE_OTHER): Payer: Self-pay | Admitting: Family Medicine

## 2012-06-27 ENCOUNTER — Encounter: Payer: Self-pay | Admitting: Family Medicine

## 2012-06-27 VITALS — BP 178/122 | HR 86 | Ht 75.0 in | Wt 291.8 lb

## 2012-06-27 DIAGNOSIS — I10 Essential (primary) hypertension: Secondary | ICD-10-CM

## 2012-06-27 DIAGNOSIS — F320 Major depressive disorder, single episode, mild: Secondary | ICD-10-CM

## 2012-06-27 DIAGNOSIS — E119 Type 2 diabetes mellitus without complications: Secondary | ICD-10-CM

## 2012-06-27 DIAGNOSIS — F329 Major depressive disorder, single episode, unspecified: Secondary | ICD-10-CM

## 2012-06-27 MED ORDER — FLUOXETINE HCL 20 MG PO CAPS: 20.0000 mg | ORAL_CAPSULE | Freq: Every day | ORAL | Status: DC

## 2012-06-27 MED ORDER — CARVEDILOL 25 MG PO TABS: ORAL_TABLET | ORAL | Status: DC

## 2012-06-27 MED ORDER — BENAZEPRIL HCL 40 MG PO TABS: 40.0000 mg | ORAL_TABLET | Freq: Every day | ORAL | Status: DC

## 2012-06-27 NOTE — Patient Instructions (Signed)
Yosgart, it was nice seeing you today.  Have a Happy Holiday! We will work on your Blood Pressure by taking your medication everyday and see you in three months.  If you would like a flu shot, please call our office.   Influenza Virus Trivalent Vaccine injection What is this medicine? INFLUENZA VIRUS VACCINE (in floo EN zuh VAHY ruhs vak SEEN) helps to reduce the risk of getting influenza also known as the flu. The vaccine only helps protect you against some strains of the flu. This medicine may be used for other purposes; ask your health care provider or pharmacist if you have questions. What should I tell my health care provider before I take this medicine? They need to know if you have any of these conditions: -bleeding disorder like hemophilia -fever or infection -Guillain-Barre syndrome or other neurological problems -immune system problems -infection with the human immunodeficiency virus (HIV) or AIDS -low blood platelet counts -multiple sclerosis -an unusual or allergic reaction to influenza virus vaccine, eggs, chicken proteins, thimerosal, other medicines, foods, dyes or preservatives -pregnant or trying to get pregnant -breast-feeding How should I use this medicine? This vaccine is for injection into a muscle or under the skin. It is given by a health care professional. A copy of Vaccine Information Statements will be given before each vaccination. Read this sheet carefully each time. The sheet may change frequently. Talk to your pediatrician regarding the use of this medicine in children. Special care may be needed. While some brands of this drug may be prescribed for children as young as 72 months of age for selected conditions, precautions do apply. Overdosage: If you think you have taken too much of this medicine contact a poison control center or emergency room at once. NOTE: This medicine is only for you. Do not share this medicine with others. What if I miss a dose? This does  not apply. What may interact with this medicine? -chemotherapy or radiation therapy -medicines that lower your immune system like etanercept, anakinra, infliximab, and adalimumab -medicines that treat or prevent blood clots like warfarin -phenytoin -steroid medicines like prednisone or cortisone -theophylline -vaccines This list may not describe all possible interactions. Give your health care provider a list of all the medicines, herbs, non-prescription drugs, or dietary supplements you use. Also tell them if you smoke, drink alcohol, or use illegal drugs. Some items may interact with your medicine. What should I watch for while using this medicine? Report any side effects that do not go away within 3 days to your doctor or health care professional. Call your health care provider if any unusual symptoms occur within 6 weeks of receiving this vaccine. You may still catch the flu, but the illness is not usually as bad. You cannot get the flu from the vaccine. The vaccine will not protect against colds or other illnesses that may cause fever. The vaccine is needed every year. What side effects may I notice from receiving this medicine? Side effects that you should report to your doctor or health care professional as soon as possible: -allergic reactions like skin rash, itching or hives, swelling of the face, lips, or tongue Side effects that usually do not require medical attention (report to your doctor or health care professional if they continue or are bothersome): -fever -headache -muscle aches and pains -pain, tenderness, redness, or swelling at the injection site -tiredness This list may not describe all possible side effects. Call your doctor for medical advice about side effects. You may  report side effects to FDA at 1-800-FDA-1088. Where should I keep my medicine? The vaccine will be given by a health care professional in a clinic, pharmacy, doctor's office, or other health care  setting. You will not be given vaccine doses to store at home. NOTE: This sheet is a summary. It may not cover all possible information. If you have questions about this medicine, talk to your doctor, pharmacist, or health care provider.  2013, [employer]. (11/25/2009 10:47:37 AM)

## 2012-06-27 NOTE — Assessment & Plan Note (Signed)
Pt c/o some ongoing depression.  Since this is a new visit, will refill his Prozac 20 mg today and get a PHQ 9 at next visit. No HI/SI today.

## 2012-06-27 NOTE — Progress Notes (Signed)
Chris Vaughn is a 56 y.o. who presents today for establishing care.  He has a known PMHx of DM treated previously with Metformin but was well controlled and taken off this medication.  Has not had an A1C since 2012.  Has not seen an eye doctor or foot exam in two years.  No current numbness, tingling, polyuria,polydipsia.   Also has a Hx of HTN.  Today's visit to 178/122 on recheck after initial of 188/124.  No current HA, Diplopia, Blurred vision, CP, palpitations, abdominal pain, edema.  Had previously been on Lotensin 40 mg qd without SE from the drug including swelling, edema, cough, but has been out of it for about 2 months now.    Was on Prozac 20 mg qd as well due to adjustment disorder with depressed mood in November of 2010.  Has not been taking this medication and is having decreased energy, sleep, and increased appetitive during that time without HI/SI.   Past Medical History  Diagnosis Date  . Hypertension   . Depression     History   Social History  . Marital Status: Married   Social History Main Topics  . Smoking status: Never Smoker      Family History  Problem Relation Age of Onset  . Diabetes Mother   . Heart disease Mother   . Hypertension Father     Current Outpatient Prescriptions on File Prior to Visit  Medication Sig Dispense Refill  . benazepril (LOTENSIN) 40 MG tablet Take 1 tablet (40 mg total) by mouth daily.  30 tablet  11  . carvedilol (COREG) 25 MG tablet TAKE ONE TABLET BY MOUTH TWICE DAILY AS DIRECTED; GOAL IS FOR BLOOD PRESSURE TO BE LESS THAN 135/85.  180 tablet  3  . FLUoxetine (PROZAC) 20 MG capsule Take 1 capsule (20 mg total) by mouth daily.  30 capsule  11    Patient Information Form: Screening and ROS  Do you feel safe in relationships? Yes PHQ-2:negative  Review of Symptoms  General:  Negative for nexplained weight loss, fever Skin: Negative for new or changing mole, sore that won't heal HEENT: + for trouble seeingNegative for  trouble hearing,  ringing in ears, mouth sores, hoarseness, change in voice, dysphagia. CV:  Negative for chest pain, dyspnea, edema, palpitations Resp: Negative for dyspnea, hemoptysis. + for cough GI: Negative for nausea, vomiting, diarrhea, constipation, abdominal pain, melena, hematochezia. GU: Negative for dysuria, incontinence, urinary hesitance, hematuria, vaginal or penile discharge, polyuria, sexual difficulty, lumps in testicle or breasts MSK: Negative for muscle cramps or aches, joint pain or swelling Neuro: Negative for headaches, weakness, numbness, dizziness, passing out/fainting Psych: Negative for depression, anxiety, memory problems  Physical Exam Filed Vitals:   06/27/12 1358  BP: 178/122  Pulse: 86    Gen: NAD, Well nourished, Well developed HEENT: PERLA, EOMI, [place]/AT Neck: no JVD, no LAD, no thyromegaly  Cardio: RRR, +1/6 SEM RUSB Lungs: CTA, no wheezes, rhonchi, crackles Abd: NABS, soft nontender nondistended MSK: ROM normal  Neuro: CN 2-12 intact, no focal neurologic deficits  Psych: AAO x 3   Lab Results  Component Value Date   HGBA1C 6.1* 08/13/2010

## 2012-06-27 NOTE — Assessment & Plan Note (Signed)
BP elevated today to 188/124 and recheck 178/122.  Has been out of his Benazepril for two months now.  Will refill today and see back in three months.  Has been over one year since labs.  Will want to get BMP, UA, Lipid Panel, A1C at next visit.  Does have Dx of DM but no insurance so will treat at 130/80.

## 2012-12-24 ENCOUNTER — Telehealth: Payer: Self-pay

## 2012-12-24 NOTE — Telephone Encounter (Signed)
PT WOULD LIKE TO COME BY AND P/U A COPY OF HIS CD OF THE RIGHT WRIST. PLEASE CALL [phone] AND HIS APPT IS ON THE 19TH

## 2012-12-25 NOTE — Telephone Encounter (Signed)
Wil place at x-ray and have them provide cd.

## 2013-02-21 ENCOUNTER — Telehealth: Payer: Self-pay | Admitting: *Deleted

## 2013-02-21 NOTE — Telephone Encounter (Signed)
Letter sent regarding diabetes follow up care Elizabeth Sherene Plancarte, RN-BSN   

## 2013-07-15 ENCOUNTER — Other Ambulatory Visit: Payer: Self-pay | Admitting: Family Medicine

## 2013-07-15 DIAGNOSIS — F320 Major depressive disorder, single episode, mild: Secondary | ICD-10-CM

## 2013-07-15 DIAGNOSIS — F32A Depression, unspecified: Secondary | ICD-10-CM

## 2013-07-15 NOTE — Progress Notes (Signed)
Please let pt know that he needs an appointment prior to refill for his depression/HTN.   Thanks, Twana FirstBryan R. Paulina FusiHess, DO [facility] 07/15/2013, 4:16 PM

## 2013-09-21 ENCOUNTER — Other Ambulatory Visit: Payer: Self-pay | Admitting: Family Medicine

## 2013-09-24 ENCOUNTER — Encounter: Payer: Self-pay | Admitting: Family Medicine

## 2013-09-24 ENCOUNTER — Ambulatory Visit (INDEPENDENT_AMBULATORY_CARE_PROVIDER_SITE_OTHER): Payer: Self-pay | Admitting: Family Medicine

## 2013-09-24 VITALS — BP 180/120 | HR 80 | Temp 98.0°F | Ht 75.0 in | Wt 284.6 lb

## 2013-09-24 DIAGNOSIS — E119 Type 2 diabetes mellitus without complications: Secondary | ICD-10-CM

## 2013-09-24 DIAGNOSIS — Z23 Encounter for immunization: Secondary | ICD-10-CM

## 2013-09-24 DIAGNOSIS — I10 Essential (primary) hypertension: Secondary | ICD-10-CM

## 2013-09-24 LAB — POCT URINALYSIS DIPSTICK
Blood, UA: NEGATIVE
GLUCOSE UA: NEGATIVE
Ketones, UA: NEGATIVE
LEUKOCYTES UA: NEGATIVE
NITRITE UA: NEGATIVE
PROTEIN UA: 100
UROBILINOGEN UA: 0.2
pH, UA: 5.5

## 2013-09-24 LAB — BASIC METABOLIC PANEL
BUN: 23 mg/dL (ref 6–23)
CHLORIDE: 102 meq/L (ref 96–112)
CO2: 25 mEq/L (ref 19–32)
CREATININE: 1.85 mg/dL — AB (ref 0.50–1.35)
Calcium: 9.7 mg/dL (ref 8.4–10.5)
Glucose, Bld: 125 mg/dL — ABNORMAL HIGH (ref 70–99)
Potassium: 4.4 mEq/L (ref 3.5–5.3)
Sodium: 136 mEq/L (ref 135–145)

## 2013-09-24 LAB — POCT UA - MICROSCOPIC ONLY: Casts, Ur, LPF, POC: >20

## 2013-09-24 LAB — POCT GLYCOSYLATED HEMOGLOBIN (HGB A1C): HEMOGLOBIN A1C: 7.4

## 2013-09-24 LAB — LDL CHOLESTEROL, DIRECT: LDL DIRECT: 132 mg/dL — AB

## 2013-09-24 MED ORDER — BENAZEPRIL HCL 40 MG PO TABS: 40.0000 mg | ORAL_TABLET | Freq: Every day | ORAL | Status: DC

## 2013-09-24 MED ORDER — AMLODIPINE BESYLATE 5 MG PO TABS: 5.0000 mg | ORAL_TABLET | Freq: Every day | ORAL | Status: DC

## 2013-09-24 NOTE — Progress Notes (Signed)
Chris Vaughn is a 58 y.o. who presents today for HTN and DM.  He has a known PMHx of DM treated previously with Metformin but was well controlled and taken off this medication.  Has not had an A1C since 2012 due to financial reasons.  Has not seen an eye doctor or foot exam in two years.  C/O polydipsia and polyuria recently but no paresthesias.   Also has a Hx of HTN.  Today's visit to 199/140 on recheck was 180/120.  No current HA, Diplopia, Blurred vision, CP, palpitations, abdominal pain, edema.  Had previously been on Lotensin 40 mg qd without SE from the drug including swelling, edema, cough, but has been out of it for about 1 months now.     Past Medical History  Diagnosis Date  . Hypertension   . Depression     History   Social History  . Marital Status: Married   Social History Main Topics  . Smoking status: Never Smoker      Family History  Problem Relation Age of Onset  . Diabetes Mother   . Heart disease Mother   . Hypertension Father     Current Outpatient Prescriptions on File Prior to Visit  Medication Sig Dispense Refill  . benazepril (LOTENSIN) 40 MG tablet Take 1 tablet (40 mg total) by mouth daily.  30 tablet  11  . carvedilol (COREG) 25 MG tablet TAKE ONE TABLET BY MOUTH TWICE DAILY AS DIRECTED; GOAL IS FOR BLOOD PRESSURE TO BE LESS THAN 135/85.  180 tablet  3  . FLUoxetine (PROZAC) 20 MG capsule Take 1 capsule (20 mg total) by mouth daily.  30 capsule  11   No current facility-administered medications on file prior to visit.    Patient Information Form: Screening and ROS  Do you feel safe in relationships? Yes PHQ-2: negative  Review of Symptoms  General:  Negative for nexplained weight loss, fever Skin: Negative for new or changing mole, sore that won't heal HEENT: + for trouble seeing.  Negative for trouble hearing,  ringing in ears, mouth sores, hoarseness, change in voice, dysphagia. CV:  Negative for chest pain, dyspnea, edema,  palpitations Resp: Negative for dyspnea, hemoptysis. + for cough GI: Negative for nausea, vomiting, diarrhea, constipation, abdominal pain, melena, hematochezia. GU: Negative for dysuria, incontinence, urinary hesitance, hematuria, vaginal or penile discharge,  + polyuria, sexual difficulty, lumps in testicle or breasts MSK: Negative for muscle cramps or aches, joint pain or swelling Neuro: Negative for headaches, weakness, numbness, dizziness, passing out/fainting Psych: Negative for depression, anxiety, memory problems  Physical Exam Filed Vitals:   09/24/13 1436  BP: 180/120  Pulse:   Temp:     Gen: NAD, Well nourished, Well developed HEENT: PERLA, EOMI, /AT, MMM, clear O/P Neck: no JVD, no LAD, no thyromegaly, no neck stiffness or meningeal signs  Cardio: RRR, +1/6 SEM RUSB Lungs: CTA, no wheezes, rhonchi, crackles Abd: NABS, soft nontender nondistended MSK: ROM normal, no erythema, no joint effusions  Neuro: CN 2-12 intact, no focal neurologic deficits, negative romberg, gait appropriate Psych: AAO x 3, appropriate effect, appropriate mood Skin: no rashes, moles, skin tags Extremities: + 2 distal pulses, trace pitting B/L edema    Lab Results  Component Value Date   HGBA1C 7.4 09/24/2013

## 2013-09-24 NOTE — Assessment & Plan Note (Signed)
199/140 on recheck was 180/120.  No current HA, Diplopia, Blurred vision, CP, palpitations, abdominal pain, edema.  Had previously been on Lotensin 40 mg qd without SE from the drug including swelling, edema, cough, but has been out of it for about 1 months now.  Will refill and start on Norvasc 5 mg today, f/u in 4 weeks.

## 2013-09-24 NOTE — Patient Instructions (Signed)
Benefits of Regular Physical Activity Reduces risk of dying early Reduces risk of developing heart disease Reduces risk of developing high blood pressure Reduces risk of developing diabetes Reducing risk of developing colon cancer Reduces feelings of depression and anxiety Helps improve cholesterol Helps reduce blood pressure in people who already have high blood pressure Helps control blood sugar in people with diabetes Helps control body weight Helps reduce stress and promotes psychological well-being Helps prevent osteoporosis (thinning of bones) Helps older adults maintain and improve mobility Helps build and maintain healthy bones, muscles, and joints Helps improve function of immune system Helps people to sleep better Increases energy and endurance   Commonly Asked Questions What is "moderate-intensity physical activity"? This is activity which is the about the same as brisk walking (3-4 mph for most adults). You should be able to hold a conversation without great difficulty while exercising at a moderate-intensity level.   What type of activities are considered "moderate-intensity"?  Brisk walking, cycling for pleasure or transportation, swimming (moderate effort), aerobics, racket sports, golf (walking and carrying clubs), canoeing leisurely, mowing lawn, home repair (e.g., painting).  Do I need to do all 30 minutes of exercise at once? No. The idea is to accumulate 30 minutes of moderate-intensity activity each day. This means if you walk ten minutes at a brisk pace to your car, then walk ten minutes up the stairs at work, and then ride a bicycle for ten minutes later that day, you have reached your 30-minute goal. The key is to be consistent with your physical activity.  How should I start out? If you haven't been regularly exercising, set a goal and start slowly. Begin with light exercise daily, and over time, you can gradually increase the intensity and  duration of your exercising. Work out a Higher education careers adviserplan with your doctor. Should I do anything at the beginning of my workouts? Yes. Start each work-out with a warmup. This can include light stretching and calisthenics to loosen up. This will help prevent injuries. It is also a good idea to finish each work-out with a cool-down period of stretching and easy walking.   When do I need to stop exercising? If you feel chest pain, very out of breath, dizzy or faint, nauseous, or very tired, you should stop. You should then contact your doctor or other health care personnel.   Helpful Hints -Get a partner - Exercising with someone else is often more fun. -Pick the right activity - Choose something you enjoy. -Vary your routine - You don't have to do the same type of exercise each day. -Don't get discouraged - It can take weeks or months to notice outward changes from your exercise program. However, you can be assured that your body is reaping the health benefits. - Five-Minute Rule: If you don't feel like exercising, commit yourself to only five minutes. If after five minutes you still don't want to exercise, then give yourself permission to stop. (Once you get started, even for only five minutes, you'll probably continue.)  Make three lists of vegetables: (1) those you like and eat now; (2) vegetables you won't even consider; and (3) vegetables you might consider trying if they are prepared a certain way.  Continue to eat veg's you currently eat, but from this last list, choose a vegetable to try at least 3 times a week.  Use small amounts of this vegetable, cut small, combined with foods or seasonings you like.    Try to eat your fruit, not drink  it.  Fruit Juice and Fruit drinks are very high in added sugar and preservatives.  Naturally occuring fruits have a good amount of fiber and sugar.  This mix allows for a more steady release of sugar from your stomach to be used by your body.  This will prevent you from  "crashing" and feeling tired after a sugary meal.    MOST IMPORTANTLY, THIS IS A JOURNEY!  It is ok to have a day here or there where you don't follow a diet to the exact guidelines.  THIS IS OK!  The goal of this journey is to learn to balance the amount of foods you are putting into your body, starting to exercise (WHICH WILL RELEASE NATURAL "FEEL GOOD HORMONES" TO MAKE YOU HAPPIER).  A day here or there, or a meal here or there that isn't within your diet guidelines is all right.  Accept it, move on, and look forward to the next day.

## 2013-09-24 NOTE — Assessment & Plan Note (Signed)
A1C elevated today to 7.4.  Last creatinine 1.6 about 3 yrs ago, will recheck BMET today, get UA for proteinuria, and get direct LDL.  If creatinine < 1.5, will consider starting metformin at next visit 500 mg and if elevated will start amaryl.  Discussed extensively about diet and exercise changes. F/U 4 weeks

## 2013-09-24 NOTE — Addendum Note (Signed)
Addended by: [place]ORDAN, Keyoni Lapinski on: 09/24/2013 06:00 PM   Modules accepted: Orders

## 2013-10-16 ENCOUNTER — Encounter (HOSPITAL_COMMUNITY): Payer: Self-pay | Admitting: Emergency Medicine

## 2013-10-16 ENCOUNTER — Emergency Department (HOSPITAL_COMMUNITY): Payer: Self-pay

## 2013-10-16 ENCOUNTER — Inpatient Hospital Stay (HOSPITAL_COMMUNITY)
Admission: EM | Admit: 2013-10-16 | Discharge: 2013-10-17 | DRG: 079 | Disposition: A | Discharge status: Home / Self Care | Payer: Self-pay | Attending: Family Medicine | Admitting: Family Medicine

## 2013-10-16 DIAGNOSIS — Z833 Family history of diabetes mellitus: Secondary | ICD-10-CM

## 2013-10-16 DIAGNOSIS — Z598 Other problems related to housing and economic circumstances: Secondary | ICD-10-CM

## 2013-10-16 DIAGNOSIS — I6350 Cerebral infarction due to unspecified occlusion or stenosis of unspecified cerebral artery: Secondary | ICD-10-CM

## 2013-10-16 DIAGNOSIS — I161 Hypertensive emergency: Secondary | ICD-10-CM | POA: Diagnosis present

## 2013-10-16 DIAGNOSIS — I129 Hypertensive chronic kidney disease with stage 1 through stage 4 chronic kidney disease, or unspecified chronic kidney disease: Secondary | ICD-10-CM | POA: Diagnosis present

## 2013-10-16 DIAGNOSIS — I10 Essential (primary) hypertension: Secondary | ICD-10-CM

## 2013-10-16 DIAGNOSIS — N183 Chronic kidney disease, stage 3 unspecified: Secondary | ICD-10-CM | POA: Diagnosis present

## 2013-10-16 DIAGNOSIS — F17200 Nicotine dependence, unspecified, uncomplicated: Secondary | ICD-10-CM | POA: Diagnosis present

## 2013-10-16 DIAGNOSIS — R209 Unspecified disturbances of skin sensation: Secondary | ICD-10-CM | POA: Diagnosis present

## 2013-10-16 DIAGNOSIS — I674 Hypertensive encephalopathy: Principal | ICD-10-CM | POA: Diagnosis present

## 2013-10-16 DIAGNOSIS — Z8249 Family history of ischemic heart disease and other diseases of the circulatory system: Secondary | ICD-10-CM

## 2013-10-16 DIAGNOSIS — E119 Type 2 diabetes mellitus without complications: Secondary | ICD-10-CM | POA: Diagnosis present

## 2013-10-16 DIAGNOSIS — R29898 Other symptoms and signs involving the musculoskeletal system: Secondary | ICD-10-CM | POA: Diagnosis present

## 2013-10-16 DIAGNOSIS — I639 Cerebral infarction, unspecified: Secondary | ICD-10-CM | POA: Diagnosis present

## 2013-10-16 DIAGNOSIS — Z5987 Material hardship due to limited financial resources, not elsewhere classified: Secondary | ICD-10-CM

## 2013-10-16 HISTORY — DX: Type 2 diabetes mellitus without complications: E11.9

## 2013-10-16 LAB — RAPID URINE DRUG SCREEN, HOSP PERFORMED
AMPHETAMINES: NOT DETECTED
BARBITURATES: NOT DETECTED
BENZODIAZEPINES: NOT DETECTED
COCAINE: NOT DETECTED
Opiates: NOT DETECTED
TETRAHYDROCANNABINOL: NOT DETECTED

## 2013-10-16 LAB — URINE MICROSCOPIC-ADD ON

## 2013-10-16 LAB — TROPONIN I: Troponin I: <0.3 ng/mL (ref <0.30)

## 2013-10-16 LAB — ETHANOL

## 2013-10-16 LAB — URINALYSIS, ROUTINE W REFLEX MICROSCOPIC
Bilirubin Urine: NEGATIVE
KETONES UR: NEGATIVE mg/dL
Leukocytes, UA: NEGATIVE
Nitrite: NEGATIVE
Specific Gravity, Urine: 1.023 (ref 1.005–1.030)
UROBILINOGEN UA: 0.2 mg/dL (ref 0.0–1.0)
pH: 5.5 (ref 5.0–8.0)

## 2013-10-16 LAB — COMPREHENSIVE METABOLIC PANEL
ALBUMIN: 3.8 g/dL (ref 3.5–5.2)
ALK PHOS: 81 U/L (ref 39–117)
ALT: 23 U/L (ref 0–53)
AST: 30 U/L (ref 0–37)
BILIRUBIN TOTAL: 0.3 mg/dL (ref 0.3–1.2)
BUN: 19 mg/dL (ref 6–23)
CHLORIDE: 99 meq/L (ref 96–112)
CO2: 24 mEq/L (ref 19–32)
Calcium: 9.6 mg/dL (ref 8.4–10.5)
Creatinine, Ser: 1.56 mg/dL — ABNORMAL HIGH (ref 0.50–1.35)
GFR calc Af Amer: 54 mL/min — ABNORMAL LOW (ref >90)
GFR calc non Af Amer: 47 mL/min — ABNORMAL LOW (ref >90)
Glucose, Bld: 217 mg/dL — ABNORMAL HIGH (ref 70–99)
POTASSIUM: 4 meq/L (ref 3.7–5.3)
Sodium: 138 mEq/L (ref 137–147)
TOTAL PROTEIN: 7.6 g/dL (ref 6.0–8.3)

## 2013-10-16 LAB — CBC
HCT: 42.8 % (ref 39.0–52.0)
Hemoglobin: 14.8 g/dL (ref 13.0–17.0)
MCH: 28.4 pg (ref 26.0–34.0)
MCHC: 34.6 g/dL (ref 30.0–36.0)
MCV: 82.1 fL (ref 78.0–100.0)
PLATELETS: 241 10*3/uL (ref 150–400)
RBC: 5.21 MIL/uL (ref 4.22–5.81)
RDW: 13.3 % (ref 11.5–15.5)
WBC: 4.8 10*3/uL (ref 4.0–10.5)

## 2013-10-16 LAB — HEMOGLOBIN A1C
HEMOGLOBIN A1C: 7.9 % — AB (ref <5.7)
Mean Plasma Glucose: 180 mg/dL — ABNORMAL HIGH (ref <117)

## 2013-10-16 LAB — PROTIME-INR
INR: 0.99 (ref 0.00–1.49)
PROTHROMBIN TIME: 12.9 s (ref 11.6–15.2)

## 2013-10-16 LAB — APTT: APTT: 30 s (ref 24–37)

## 2013-10-16 MED ORDER — SODIUM CHLORIDE 0.45 % IV SOLN
INTRAVENOUS | Status: DC
  Administered 2013-10-16 – 2013-10-17 (×2): via INTRAVENOUS

## 2013-10-16 MED ORDER — AMLODIPINE BESYLATE 10 MG PO TABS: 10.0000 mg | ORAL_TABLET | Freq: Every day | ORAL | Status: DC

## 2013-10-16 MED ORDER — SODIUM CHLORIDE 0.9 % IV BOLUS (SEPSIS)
500.0000 mL | Freq: Once | INTRAVENOUS | Status: AC
  Administered 2013-10-16: 500 mL via INTRAVENOUS

## 2013-10-16 MED ORDER — LABETALOL HCL 5 MG/ML IV SOLN
10.0000 mg | Freq: Once | INTRAVENOUS | Status: AC
  Administered 2013-10-16: 10 mg via INTRAVENOUS
  Filled 2013-10-16: qty 4

## 2013-10-16 MED ORDER — HYDRALAZINE HCL 20 MG/ML IJ SOLN
5.0000 mg | Freq: Four times a day (QID) | INTRAMUSCULAR | Status: DC | PRN
  Administered 2013-10-16 – 2013-10-17 (×2): 5 mg via INTRAVENOUS
  Filled 2013-10-16 (×2): qty 1

## 2013-10-16 MED ORDER — SENNOSIDES-DOCUSATE SODIUM 8.6-50 MG PO TABS: 1.0000 | ORAL_TABLET | Freq: Every evening | ORAL | Status: DC | PRN

## 2013-10-16 MED ORDER — ASPIRIN EC 81 MG PO TBEC
81.0000 mg | DELAYED_RELEASE_TABLET | Freq: Every day | ORAL | Status: DC
  Administered 2013-10-17: 81 mg via ORAL
  Filled 2013-10-16: qty 1

## 2013-10-16 NOTE — H&P (Signed)
FMTS ATTENDING ADMISSION NOTE Chris Mesa,MD I  have seen and examined this patient, reviewed their chart. I have discussed this patient with the resident. I agree with the resident's findings, assessment and care plan.  58 y/O M with PMX of DM2, HTN, presented to the ED from his chiropractor for elevated BP and SOB associated with pins and needle sensation of his left arm and right foot weakness. He denies any fall, no facial asymmetry,no speech difficulty. He endorsed frontal headache and mild change in vision at his chiropractor's office but now he can see clearly but his headache persist. He denies previous episodes of pins and needle sensation.  No current facility-administered medications on file prior to encounter.   Current Outpatient Prescriptions on File Prior to Encounter  Medication Sig Dispense Refill  . amLODipine (NORVASC) 5 MG tablet Take 1 tablet (5 mg total) by mouth daily.  90 tablet  0   Past Medical History  Diagnosis Date  . Hypertension   . Depression   . Diabetes mellitus without complication    Filed Vitals:   10/16/13 1503 10/16/13 1530 10/16/13 1554 10/16/13 1558  BP:  158/80 177/101   Pulse: 103 98 96   Temp:   98 F (36.7 C)   TempSrc:   Oral   Resp:  18 20   Height:    6\' 3"  (1.905 m)  Weight:    285 lb (129.275 kg)  SpO2: 97% 96% 97%    Exam; Gen: Calm in bed, not in distress. Neuro: Awake and alert, oriented x 3, no facial asymmetry, speech sounds normal to me, power across all joints 4-5/5. No sensory loss, DTR intact. HEENT: EOMI, PERRLA. Resp: Air entry equal B/L, no abnormal BS. CV; S1 S 2 normal,no murmurs. Abd: Benign. Ext: No edema.  A/P;  1.58 Y/O with Limb weakness and paresthesia: CVA.       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      Recommending MRI/MRA.      Risk stratification labs.      PT/OP      Fall precaution.      Neurology consult.      Monitor on telemetry.  2.HTN emergency:    Was on  Norvasc 5 mg at home.    Elevated BP permissive for Acute stroke.    Plan to keep BP between 180-190 systolic. For now.  3. DM/CKD: Continue pre-hospital management.

## 2013-10-16 NOTE — Progress Notes (Signed)
*  PRELIMINARY RESULTS* Vascular Ultrasound Carotid Duplex (Doppler) has been completed.  Preliminary findings: Bilateral:  1-39% ICA stenosis.  Right vertebral artery flow is atypical but antegrade. Left vertebral artery flow is antegrade.      [name] Chris Vaughn 10/16/2013, 7:15 PM

## 2013-10-16 NOTE — ED Notes (Signed)
Per EMS - was at Chriropractor when he had a sudden onset of dizziness, diaphoresis and tingling down left arm. BP 290/150, hx of HTN, recently had meds changed. When he woke up this morning he felt like he was dragging left foot. No neuro deficits seen. CBG 255, hx of DM but doesn't take medicine. HR 124 ST.

## 2013-10-16 NOTE — ED Notes (Signed)
Admitting physician at bedside

## 2013-10-16 NOTE — H&P (Signed)
Family Medicine Teaching [facility] Admission History and Physical Service Pager: [phone]  Patient name: Chris Vaughn Medical record number: [id number] Date of birth: 1955/11/19 Age: 59 y.o. Gender: male  Primary Care Provider: Gildardo Cranker, DO Consultants: Neurology Code Status: Full code  Chief Complaint: High blood pressure  Assessment and Plan: Chris Vaughn is a 58 y.o. male presenting with hypertensive emergency and CVA/TIA . PMH is significant for HTN, DM2, poor adherence to treatment plans / follow-up with PCP recommendations.  # ?CVA/TIA: patient does not have any focal neurologic symptoms. CT shows right lower pons infarct which is possibly acute. Patient has uncontrolled hypertension, currently in hypertensive emergency, and diabetes. Patient states no history of hyperlipidemia, although recent direct LDL is 132.  Admit to inpatient, telemetry, Attending physician Dr. Lum Babe  MRI/MRA, carotid doppler, echocardiogram, lipid panel, TSH  Consult neurology  Cardiac monitoring  Neuro checks q4  Swallow study, if passes, may advance diet  PT/OT  Pulse oximetry, keep O2 >92%  # History of chest pain: concern for possible ischemic etiology; history suggests stable angina. Patient currently not having chest pain. EKG not suggestive of acute MI. Initial troponin negative. Possible musculoskeletal since position sometimes worsens pain but still concern for ischemia  Cycle troponin x2  Follow-up TSH  Repeat EKG in AM  # Hypertensive emergency: patient came in with blood pressures 216/127. Has evidence of chronic renal disease with creatinine slightly improved from previously and now . Current blood pressure 177/101  Will allow permissive hypertension 210/110; hydralazine for BPs >210/110  Will hold home amlodipine  # Diabetes mellitus: last A1C of 7.4 on 09/24/13. Currently not on medication. PCP planned to start metformin if renal function  allowed.  Will hold treatment for now since patient is NPO. Plan to monitor CBGs qAC with SSI if needed  # CKD stage 3: creatinine today: 1.56, down from 1.85. GFR: 54. - trend/monitor with labs   FEN/GI: 1/2NS@ 29ml/hr Prophylaxis: SCDs  Disposition: Admit to telemetry  History of Present Illness: Chris Vaughn is a 58 y.o. male presenting with hypertensive emergency with systolic blood pressures over 130Q. He was initially on coreg, which was stopped last week by his PCP and he was switched to amlodipine 5mg  daily. He has been taking his blood pressure medication every day. Today, while on his way to his chiropractor, he noticed he had a heavy feeling in his left leg and was dragging it. His symptoms resolved after some time. He is slightly vague with his description of timing; some of these symptoms may have been present as long as a couple of weeks ago. While at the chiropractor, blood pressure was obtained and his systolic greater than 200. EMS was called and he was transported to the ED. While in the ED, his blood pressure was initially 216/127. He received a bolus of normal saline and labetalol 10mg  and his blood pressure improved to 180/106. CT head revealed possible acute infarct of lower right pons.   Patient also complains of a sharp chest pain that has been bothering him for the past month. It is mostly related to exertion but sometimes present when he is at rest. Pain resolves when he takes a rest or after a few minutes. It is sometimes aggravated by changing position. His chest pain does not radiate; however, he does has non-associated jaw pain (with mastication mostly) and current left arm slight numbness and tingling. He is having headaches, which is new, and some general weakness mostly  in upper arms.  Review Of Systems: Per HPI with the following additions: None Otherwise 12 point review of systems was performed and was unremarkable.  Patient Active Problem List    Diagnosis Date Noted  . CVA (cerebral infarction) 10/16/2013  . Mild depression 06/27/2012  . HTN, goal below 130/80 06/27/2012  . DIABETES MELLITUS 08/13/2010   Past Medical History: Past Medical History  Diagnosis Date  . Hypertension   . Depression   . Diabetes mellitus without complication    Past Surgical History: Past Surgical History  Procedure Laterality Date  . Back surgery    Lipoma removed about 20 years ago  Social History: History  Substance Use Topics  . Smoking status: Never Smoker   . Smokeless tobacco: Not on file  . Alcohol Use: No   Additional social history: None  Please also refer to relevant sections of EMR.  Family History: Family History  Problem Relation Age of Onset  . Diabetes Mother   . Heart disease Mother   . Hypertension Father   Mother died from MI at 63years old  Allergies and Medications: No Known Allergies No current facility-administered medications on file prior to encounter.   Current Outpatient Prescriptions on File Prior to Encounter  Medication Sig Dispense Refill  . amLODipine (NORVASC) 5 MG tablet Take 1 tablet (5 mg total) by mouth daily.  90 tablet  0    Objective: BP 162/107  Pulse 101  Temp(Src) 98 F (36.7 C) (Oral)  Resp 15  SpO2 97%  Exam: General: Laying in bed, in no acute distress. Wife at bedside HEENT: PERRL Neck: slight enlargement of thyroid gland. Cardiovascular: Regular rate and rhythm. II/VI stolic murmur heard best at RUSB with no radiation to carotids. No carotid bruit. Respiratory: Clear to auscultation bilaterally, no wheezes, no increased work of breathing Abdomen: Soft, non-tender, obese, BS+ Extremities: warm and well perfused Skin: no cyanosis, no rashes Neuro: cranial nerves intact and symmetric. 5/5 upper and lower extremity strength, sensation intact, gait normal, cerebellar functions intact; normal rapid alternating movements, normal finger-to-nose, normal forward and backward gait  on toes and heels  Labs and Imaging: CBC BMET   Recent Labs Lab 10/16/13 1221  WBC 4.8  HGB 14.8  HCT 42.8  PLT 241    Recent Labs Lab 10/16/13 1221  NA 138  K 4.0  CL 99  CO2 24  BUN 19  CREATININE 1.56*  GLUCOSE 217*  CALCIUM 9.6     Ct Head Wo Contrast  10/16/2013   CLINICAL DATA:  Dizziness  EXAM: CT HEAD WITHOUT CONTRAST  TECHNIQUE: Contiguous axial images were obtained from the base of the skull through the vertex without intravenous contrast. Study was obtained within 24 hr of patient's arrival at the emergency department.  COMPARISON:  None.  FINDINGS: Ventricles are normal in size and configuration. There is no mass, hemorrhage, extra-axial fluid collection, or midline shift. There is patchy small vessel disease in the centra semiovale bilaterally. There is a focal area of decreased attenuation in the mid to lower right pons which may represent a recent infarct. No other findings suggesting recent infarct. Bony calvarium appears intact. Visualized mastoid air cells are clear.  IMPRESSION: Question recent infarct lower right pons. There is patchy small vessel disease in the centra semiovale bilaterally. No hemorrhage or mass effect.   Electronically Signed   By: Bretta Bang M.D.   On: 10/16/2013 13:20    Jacquelin Hawking, MD 10/16/2013, 2:15 PM PGY-1, [facility] FPTS Intern pager: [phone], text pages welcome  FPTS Upper-Level Resident Addendum  I have independently interviewed and examined the patient. I have discussed the above with Dr. Caleb PoppNettey and agree with his documentation as above. The above reflects his original note with my edits for correction/additions/clarification in orange. Please see also any attending notes.   Bobbye Mortonhristopher M Street, MD PGY-2, [facility] FPTS Service pager: [phone] (text pages welcome through [facility])

## 2013-10-16 NOTE — ED Notes (Signed)
Pt sts while he was at the chiropractor he suddenly felt lightheaded and numbness feeling in left arm, sts they checked his BP and noticed it was very high. sts he was there for a disability exam, had not had any manipulations done yet. Pt reports now the numbness and dizziness is gone, sts he does have a HA located in anterior and posterior head. Pt sts when he was walking to the bus this morning he noticed he was having to drag his right foot, 820am today, to keep himself balanced. sts he is no long having that feeling to his right foot. sts hx of htn but ran out of money so he hasn't been taking his BP meds for about a month but just recently started taking it again 5 days ago. Denies CP/SOB.

## 2013-10-16 NOTE — ED Provider Notes (Signed)
CSN: [id number]     Arrival date & time 10/16/13  1202 History   First MD Initiated Contact with Patient 10/16/13 1204     Chief Complaint  Patient presents with  . Dizziness     (Consider location/radiation/quality/duration/timing/severity/associated sxs/prior Treatment) Patient is a 58 y.o. male presenting with dizziness and neurologic complaint. The history is provided by the patient.  Dizziness Quality:  Lightheadedness Severity:  Mild Onset quality:  Gradual Duration: minutes. Timing:  Constant Progression:  Improving Chronicity:  New Relieved by:  Nothing Worsened by:  Nothing tried Ineffective treatments:  None tried Associated symptoms: chest pain (w/ exertion last night) and headaches   Associated symptoms: no diarrhea, no nausea, no shortness of breath and no vomiting   Headaches:    Severity:  Mild   Onset quality:  Gradual   Duration: 30 min.   Timing:  Constant   Progression:  Unchanged   Chronicity:  New Neurologic Problem This is a new problem. The current episode started 3 to 5 hours ago. Episode frequency: intermittent. The problem has been gradually improving. Associated symptoms include chest pain (w/ exertion last night) and headaches. Pertinent negatives include no abdominal pain and no shortness of breath. Nothing aggravates the symptoms. Nothing relieves the symptoms. He has tried nothing for the symptoms. The treatment provided no relief.    Past Medical History  Diagnosis Date  . Hypertension   . Depression   . Diabetes mellitus without complication    Past Surgical History  Procedure Laterality Date  . Back surgery     Family History  Problem Relation Age of Onset  . Diabetes Mother   . Heart disease Mother   . Hypertension Father    History  Substance Use Topics  . Smoking status: Never Smoker   . Smokeless tobacco: Not on file  . Alcohol Use: No    Review of Systems  Constitutional: Negative for fever.  HENT: Negative for drooling  and rhinorrhea.   Eyes: Negative for pain.  Respiratory: Negative for cough and shortness of breath.   Cardiovascular: Positive for chest pain (w/ exertion last night). Negative for leg swelling.  Gastrointestinal: Negative for nausea, vomiting, abdominal pain and diarrhea.  Genitourinary: Negative for dysuria and hematuria.  Musculoskeletal: Negative for gait problem and neck pain.  Skin: Negative for color change.  Neurological: Positive for dizziness, weakness, light-headedness, numbness and headaches.  Hematological: Negative for adenopathy.  Psychiatric/Behavioral: Negative for behavioral problems.  All other systems reviewed and are negative.     Allergies  Review of patient's allergies indicates no known allergies.  Home Medications   Current Outpatient Rx  Name  Route  Sig  Dispense  Refill  . amLODipine (NORVASC) 5 MG tablet   Oral   Take 1 tablet (5 mg total) by mouth daily.   90 tablet   0   . benazepril (LOTENSIN) 40 MG tablet   Oral   Take 1 tablet (40 mg total) by mouth daily.   90 tablet   0    BP 216/127  Pulse 113  Temp(Src) 98 F (36.7 C) (Oral)  Resp 16  SpO2 96% Physical Exam  Nursing note and vitals reviewed. Constitutional: He is oriented to person, place, and time. He appears well-developed and well-nourished.  HENT:  Head: Normocephalic and atraumatic.  Right Ear: External ear normal.  Left Ear: External ear normal.  Nose: Nose normal.  Mouth/Throat: Oropharynx is clear and moist. No oropharyngeal exudate.  Eyes: Conjunctivae and EOM are  normal. Pupils are equal, round, and reactive to light.  Neck: Normal range of motion. Neck supple.  Cardiovascular: Normal rate, regular rhythm, normal heart sounds and intact distal pulses.  Exam reveals no gallop and no friction rub.   No murmur heard. Pulmonary/Chest: Effort normal and breath sounds normal. No respiratory distress. He has no wheezes.  Abdominal: Soft. Bowel sounds are normal. He  exhibits no distension. There is no tenderness. There is no rebound and no guarding.  Musculoskeletal: Normal range of motion. He exhibits no edema and no tenderness.  Neurological: He is alert and oriented to person, place, and time. He has normal strength. No cranial nerve deficit or sensory deficit. He displays a negative Romberg sign. Coordination and gait normal.  alert, oriented x3 speech: normal in context and clarity memory: intact grossly cranial nerves II-XII: intact motor strength: full proximally and distally no involuntary movements or tremors sensation: intact to light touch diffusely  cerebellar: finger-to-nose and heel-to-shin intact gait: normal forwards and backwards   Skin: Skin is warm and dry.  Psychiatric: He has a normal mood and affect. His behavior is normal.    ED Course  Procedures (including critical care time) Labs Review Labs Reviewed  COMPREHENSIVE METABOLIC PANEL - Abnormal; Notable for the following:    Glucose, Bld 217 (*)    Creatinine, Ser 1.56 (*)    GFR calc non Af Amer 47 (*)    GFR calc Af Amer 54 (*)    All other components within normal limits  URINALYSIS, ROUTINE W REFLEX MICROSCOPIC - Abnormal; Notable for the following:    Glucose, UA >1000 (*)    Hgb urine dipstick MODERATE (*)    Protein, ur >300 (*)    All other components within normal limits  URINE MICROSCOPIC-ADD ON - Abnormal; Notable for the following:    Squamous Epithelial / LPF FEW (*)    Casts HYALINE CASTS (*)    All other components within normal limits  URINE RAPID DRUG SCREEN (HOSP PERFORMED)  CBC  ETHANOL  PROTIME-INR  APTT  TROPONIN I  HEMOGLOBIN A1C   Imaging Review Ct Head Wo Contrast  10/16/2013   CLINICAL DATA:  Dizziness  EXAM: CT HEAD WITHOUT CONTRAST  TECHNIQUE: Contiguous axial images were obtained from the base of the skull through the vertex without intravenous contrast. Study was obtained within 24 hr of patient's arrival at the emergency  department.  COMPARISON:  None.  FINDINGS: Ventricles are normal in size and configuration. There is no mass, hemorrhage, extra-axial fluid collection, or midline shift. There is patchy small vessel disease in the centra semiovale bilaterally. There is a focal area of decreased attenuation in the mid to lower right pons which may represent a recent infarct. No other findings suggesting recent infarct. Bony calvarium appears intact. Visualized mastoid air cells are clear.  IMPRESSION: Question recent infarct lower right pons. There is patchy small vessel disease in the centra semiovale bilaterally. No hemorrhage or mass effect.   Electronically Signed   By: Bretta BangWilliam  Woodruff M.D.   On: 10/16/2013 13:20     EKG Interpretation   Date/Time:  Wednesday October 16 2013 12:09:59 EDT Ventricular Rate:  113 PR Interval:  179 QRS Duration: 81 QT Interval:  347 QTC Calculation: 476 R Axis:   -21 Text Interpretation:  Sinus tachycardia Left atrial enlargement Borderline  left axis deviation Borderline prolonged QT interval Baseline wander in  lead(s) II III aVF No significant change since last tracing Confirmed by  Suzanne Garbers  MD, Oscar Forman [phone]) on 10/16/2013 12:23:17 PM      MDM   Final diagnoses:  CVA (cerebral infarction)    12:18 PM 58 y.o. male history of hypertension, diabetes who presents with dizziness. The patient states that while walking to the bus approximately 8:20 AM this morning he noticed that his right foot was dragging slightly in his right leg felt heavy. He notes that the symptoms resolved shortly after. Prior to arrival here he was being evaluated by his chiropractor where was found that his blood pressure was high, to 290/150. The patient notes that he began feeling lightheaded and developed a mild headache and also some tingling and numbness sensation in his left upper extremity about 30 minutes ago. He notes continued lightheadedness and mild frontal and occipital headache but denies  any ongoing numbness or weakness. He is afebrile and hypertensive here, 216/127. Will give small bolus of fluids, 10 mg of IV labetalol, and TIA workup.  BP coming down appropriately, 174/85.   The patient will be admitted to [facility] medicine. Any medications given in the ED during this visit are listed below:  Medications  sodium chloride 0.9 % bolus 500 mL (0 mLs Intravenous Stopped 10/16/13 1400)  labetalol (NORMODYNE,TRANDATE) injection 10 mg (10 mg Intravenous Given 10/16/13 1230)        Junius Argyle, MD 10/16/13 1538

## 2013-10-16 NOTE — Consult Note (Signed)
Referring Physician: ED    Chief Complaint: Left arm numbness, right leg weakness, dizziness.  HPI:                                                                                                                                         Chris Vaughn is an 58 y.o. male, right handed, with a past medical history significant for HTN with poor adherence to treatment, DM type 2, smoking, admitted to [facility] for further evaluation of the above stated symptoms in the setting of hypertensive emergency. He indicated that this morning he went to see his chiropractor and while in his office developed sweatiness, dizziness, numbness of the left arm,B blurred vision, HA, and was also dragging the right leg. He was noted to have significant elevation of his BP and was sent to the ED for further evaluation. Denies associated vertigo, double vision, slurred speech, confusion, imbalance, weakness of his arms or left leg. He said that he also had chest pain this morning. Upon arrival to the ED no focal neurological findings noted but had a CT brain showing questionable acute infarct right pons. At this moment, he complains of intermittent numbness of the left arm and HA.    Date last known well: 10/16/13 Time last known well: uncertain tPA Given: no, out of the window NIHSS: 0 MRS: 0  Past Medical History  Diagnosis Date  . Hypertension   . Depression   . Diabetes mellitus without complication     Past Surgical History  Procedure Laterality Date  . Back surgery      Family History  Problem Relation Age of Onset  . Diabetes Mother   . Heart disease Mother   . Hypertension Father    Social History:  reports that he has never smoked. He does not have any smokeless tobacco history on file. He reports that he does not drink alcohol. His drug history is not on file.  Allergies: No Known Allergies  Medications:                                                                                                                            I have reviewed the patient's current medications.  ROS:  History obtained from the patient and chart review.  General ROS: negative for - chills, fatigue, fever, night sweats, or weight loss Psychological ROS: negative for - behavioral disorder, hallucinations, memory difficulties, mood swings or suicidal ideation Ophthalmic ROS: negative for - blurry vision, double vision, eye pain or loss of vision ENT ROS: negative for - epistaxis, nasal discharge, oral lesions, sore throat, tinnitus or vertigo Allergy and Immunology ROS: negative for - hives or itchy/watery eyes Hematological and Lymphatic ROS: negative for - bleeding problems, bruising or swollen lymph nodes Endocrine ROS: negative for - galactorrhea, hair pattern changes, polydipsia/polyuria or temperature intolerance Respiratory ROS: negative for - cough, hemoptysis, or wheezing Cardiovascular ROS: significant  for - chest pain and shortness of breath Gastrointestinal ROS: negative for - abdominal pain, diarrhea, hematemesis, nausea/vomiting or stool incontinence Genito-Urinary ROS: negative for - dysuria, hematuria, incontinence or urinary frequency/urgency Musculoskeletal ROS: negative for - joint swelling or muscular weakness Neurological ROS: as noted in HPI Dermatological ROS: negative for rash and skin lesion changes  Physical exam: pleasant male in no apparent distress. Blood pressure 197/111, pulse 91, temperature 97.7 F (36.5 C), temperature source Oral, resp. rate 20, height _0  (1.905 m), weight 129.275 kg (285 lb), SpO2 97.00%. Head: normocephalic. Neck: supple, no bruits, no JVD. Cardiac: no murmurs. Lungs: clear. Abdomen: soft, no tender, no mass. Extremities: no edema. CV: pulses palpable throughout  Neurologic Examination:                                                                                                       Mental Status: Alert, oriented, thought content appropriate.  Speech fluent without evidence of aphasia.  Able to follow 3 step commands without difficulty. Cranial Nerves: II: Discs flat bilaterally; Visual fields grossly normal, pupils equal, round, reactive to light and accommodation III,IV, VI: ptosis not present, extra-ocular motions intact bilaterally V,VII: smile symmetric, facial light touch sensation normal bilaterally VIII: hearing normal bilaterally IX,X: gag reflex present XI: bilateral shoulder shrug XII: midline tongue extension without atrophy or fasciculations  Motor: Right : Upper extremity   5/5    Left:     Upper extremity   5/5  Lower extremity   5/5     Lower extremity   5/5 Tone and bulk:normal tone throughout; no atrophy noted Sensory: Pinprick and light touch intact throughout, bilaterally Deep Tendon Reflexes:  1+ all over Plantars: Right: downgoing   Left: downgoing Cerebellar: normal finger-to-nose,  normal heel-to-shin test Gait:  No tested.    Results for orders placed during the hospital encounter of 10/16/13 (from the past 48 hour(s))  CBC     Status: None   Collection Time    10/16/13 12:21 PM      Result Value Ref Range   WBC 4.8  4.0 - 10.5 K/uL   RBC 5.21  4.22 - 5.81 MIL/uL   Hemoglobin 14.8  13.0 - 17.0 g/dL   HCT 42.8  39.0 - 52.0 %   MCV 82.1  78.0 - 100.0 fL   MCH 28.4  26.0 - 34.0 pg  MCHC 34.6  30.0 - 36.0 g/dL   RDW 13.3  11.5 - 15.5 %   Platelets 241  150 - 400 K/uL  COMPREHENSIVE METABOLIC PANEL     Status: Abnormal   Collection Time    10/16/13 12:21 PM      Result Value Ref Range   Sodium 138  137 - 147 mEq/L   Potassium 4.0  3.7 - 5.3 mEq/L   Comment: HEMOLYSIS AT THIS LEVEL MAY AFFECT RESULT   Chloride 99  96 - 112 mEq/L   CO2 24  19 - 32 mEq/L   Glucose, Bld 217 (*) 70 - 99 mg/dL   BUN 19  6 - 23 mg/dL   Creatinine, Ser 1.56 (*) 0.50 - 1.35  mg/dL   Calcium 9.6  8.4 - 10.5 mg/dL   Total Protein 7.6  6.0 - 8.3 g/dL   Albumin 3.8  3.5 - 5.2 g/dL   AST 30  0 - 37 U/L   Comment: HEMOLYSIS AT THIS LEVEL MAY AFFECT RESULT   ALT 23  0 - 53 U/L   Comment: HEMOLYSIS AT THIS LEVEL MAY AFFECT RESULT   Alkaline Phosphatase 81  39 - 117 U/L   Total Bilirubin 0.3  0.3 - 1.2 mg/dL   GFR calc non Af Amer 47 (*) >90 mL/min   GFR calc Af Amer 54 (*) >90 mL/min   Comment: (NOTE)     The eGFR has been calculated using the CKD EPI equation.     This calculation has not been validated in all clinical situations.     eGFR's persistently <90 mL/min signify possible Chronic Kidney     Disease.  ETHANOL     Status: None   Collection Time    10/16/13 12:21 PM      Result Value Ref Range   Alcohol, Ethyl (B) <11  0 - 11 mg/dL   Comment:            LOWEST DETECTABLE LIMIT FOR     SERUM ALCOHOL IS 11 mg/dL     FOR MEDICAL PURPOSES ONLY  HEMOGLOBIN A1C     Status: Abnormal   Collection Time    10/16/13 12:21 PM      Result Value Ref Range   Hemoglobin A1C 7.9 (*) <5.7 %   Comment: (NOTE)                                                                               According to the ADA Clinical Practice Recommendations for 2011, when     HbA1c is used as a screening test:      >=6.5%   Diagnostic of Diabetes Mellitus               (if abnormal result is confirmed)     5.7-6.4%   Increased risk of developing Diabetes Mellitus     References:Diagnosis and Classification of Diabetes Mellitus,Diabetes     EBRA,3094,07(WKGSU 1):S62-S69 and Standards of Medical Care in             Diabetes - 2011,Diabetes Care,2011,34 (Suppl 1):S11-S61.   Mean Plasma Glucose 180 (*) <117 mg/dL   Comment: Performed at [facility]  PROTIME-INR     Status: None   Collection Time    10/16/13 12:21 PM      Result Value Ref Range   Prothrombin Time 12.9  11.6 - 15.2 seconds   INR 0.99  0.00 - 1.49  APTT     Status: None   Collection Time    10/16/13  12:21 PM      Result Value Ref Range   aPTT 30  24 - 37 seconds  TROPONIN I     Status: None   Collection Time    10/16/13 12:21 PM      Result Value Ref Range   Troponin I <0.30  <0.30 ng/mL   Comment:            Due to the release kinetics of cTnI,     a negative result within the first hours     of the onset of symptoms does not rule out     myocardial infarction with certainty.     If myocardial infarction is still suspected,     repeat the test at appropriate intervals.  URINE RAPID DRUG SCREEN (HOSP PERFORMED)     Status: None   Collection Time    10/16/13 12:42 PM      Result Value Ref Range   Opiates NONE DETECTED  NONE DETECTED   Cocaine NONE DETECTED  NONE DETECTED   Benzodiazepines NONE DETECTED  NONE DETECTED   Amphetamines NONE DETECTED  NONE DETECTED   Tetrahydrocannabinol NONE DETECTED  NONE DETECTED   Barbiturates NONE DETECTED  NONE DETECTED   Comment:            DRUG SCREEN FOR MEDICAL PURPOSES     ONLY.  IF CONFIRMATION IS NEEDED     FOR ANY PURPOSE, NOTIFY LAB     WITHIN 5 DAYS.                LOWEST DETECTABLE LIMITS     FOR URINE DRUG SCREEN     Drug Class       Cutoff (ng/mL)     Amphetamine      1000     Barbiturate      200     Benzodiazepine   588     Tricyclics       502     Opiates          300     Cocaine          300     THC              50  URINALYSIS, ROUTINE W REFLEX MICROSCOPIC     Status: Abnormal   Collection Time    10/16/13 12:42 PM      Result Value Ref Range   Color, Urine YELLOW  YELLOW   APPearance CLEAR  CLEAR   Specific Gravity, Urine 1.023  1.005 - 1.030   pH 5.5  5.0 - 8.0   Glucose, UA >1000 (*) NEGATIVE mg/dL   Hgb urine dipstick MODERATE (*) NEGATIVE   Bilirubin Urine NEGATIVE  NEGATIVE   Ketones, ur NEGATIVE  NEGATIVE mg/dL   Protein, ur >300 (*) NEGATIVE mg/dL   Urobilinogen, UA 0.2  0.0 - 1.0 mg/dL   Nitrite NEGATIVE  NEGATIVE   Leukocytes, UA NEGATIVE  NEGATIVE  URINE MICROSCOPIC-ADD ON     Status: Abnormal    Collection Time    10/16/13 12:42 PM      Result Value Ref Range  Squamous Epithelial / LPF FEW (*) RARE   WBC, UA 0-2  <3 WBC/hpf   RBC / HPF 0-2  <3 RBC/hpf   Bacteria, UA RARE  RARE   Casts HYALINE CASTS (*) NEGATIVE  TROPONIN I     Status: None   Collection Time    10/16/13  7:13 PM      Result Value Ref Range   Troponin I <0.30  <0.30 ng/mL   Comment:            Due to the release kinetics of cTnI,     a negative result within the first hours     of the onset of symptoms does not rule out     myocardial infarction with certainty.     If myocardial infarction is still suspected,     repeat the test at appropriate intervals.   Ct Head Wo Contrast  10/16/2013   CLINICAL DATA:  Dizziness  EXAM: CT HEAD WITHOUT CONTRAST  TECHNIQUE: Contiguous axial images were obtained from the base of the skull through the vertex without intravenous contrast. Study was obtained within 24 hr of patient's arrival at the emergency department.  COMPARISON:  None.  FINDINGS: Ventricles are normal in size and configuration. There is no mass, hemorrhage, extra-axial fluid collection, or midline shift. There is patchy small vessel disease in the centra semiovale bilaterally. There is a focal area of decreased attenuation in the mid to lower right pons which may represent a recent infarct. No other findings suggesting recent infarct. Bony calvarium appears intact. Visualized mastoid air cells are clear.  IMPRESSION: Question recent infarct lower right pons. There is patchy small vessel disease in the centra semiovale bilaterally. No hemorrhage or mass effect.   Electronically Signed   By: Lowella Grip M.D.   On: 10/16/2013 13:20     Assessment: 58 y.o. male with new onset left arm numbness, right leg weakness ( resolved), and dizziness in the setting of hypertensive emergency. CT brain with questionable acute right pontine infarct. Stroke work up underway. Aspirin 81 mg daily. Stroke team will follow up in  am.    Stroke Risk Factors - HTN, DM type 2, smoking.  Plan: 1. HgbA1c, fasting lipid panel 2. MRI, MRA  of the brain without contrast 3. Echocardiogram 4. Carotid dopplers 5. Prophylactic therapy-aspirin 81 mg daily. 6. Risk factor modification 7. Telemetry monitoring 8. Frequent neuro checks 9. PT/OT SLP (NO NEED AT THIS MOMENT).  Dorian Pod, MD [facility] [phone]  10/16/2013, 9:53 PM

## 2013-10-17 ENCOUNTER — Inpatient Hospital Stay (HOSPITAL_COMMUNITY): Payer: Self-pay

## 2013-10-17 DIAGNOSIS — I517 Cardiomegaly: Secondary | ICD-10-CM

## 2013-10-17 LAB — BASIC METABOLIC PANEL
BUN: 14 mg/dL (ref 6–23)
CO2: 23 mEq/L (ref 19–32)
CREATININE: 1.43 mg/dL — AB (ref 0.50–1.35)
Calcium: 9.3 mg/dL (ref 8.4–10.5)
Chloride: 93 mEq/L — ABNORMAL LOW (ref 96–112)
GFR, EST AFRICAN AMERICAN: 60 mL/min — AB (ref >90)
GFR, EST NON AFRICAN AMERICAN: 52 mL/min — AB (ref >90)
Glucose, Bld: 178 mg/dL — ABNORMAL HIGH (ref 70–99)
Potassium: 3.8 mEq/L (ref 3.7–5.3)
Sodium: 134 mEq/L — ABNORMAL LOW (ref 137–147)

## 2013-10-17 LAB — CBC
HCT: 46.3 % (ref 39.0–52.0)
Hemoglobin: 16.1 g/dL (ref 13.0–17.0)
MCH: 28.4 pg (ref 26.0–34.0)
MCHC: 34.8 g/dL (ref 30.0–36.0)
MCV: 81.7 fL (ref 78.0–100.0)
PLATELETS: 290 10*3/uL (ref 150–400)
RBC: 5.67 MIL/uL (ref 4.22–5.81)
RDW: 13.5 % (ref 11.5–15.5)
WBC: 6.1 10*3/uL (ref 4.0–10.5)

## 2013-10-17 LAB — LIPID PANEL
CHOL/HDL RATIO: 4.5 ratio
Cholesterol: 204 mg/dL — ABNORMAL HIGH (ref 0–200)
HDL: 45 mg/dL (ref >39)
LDL CALC: 116 mg/dL — AB (ref 0–99)
Triglycerides: 217 mg/dL — ABNORMAL HIGH (ref <150)
VLDL: 43 mg/dL — ABNORMAL HIGH (ref 0–40)

## 2013-10-17 LAB — T4, FREE: Free T4: 1.34 ng/dL (ref 0.80–1.80)

## 2013-10-17 LAB — HEMOGLOBIN A1C
Hgb A1c MFr Bld: 7.9 % — ABNORMAL HIGH (ref <5.7)
MEAN PLASMA GLUCOSE: 180 mg/dL — AB (ref <117)

## 2013-10-17 LAB — TROPONIN I: Troponin I: <0.3 ng/mL (ref <0.30)

## 2013-10-17 LAB — T3, FREE: T3 FREE: 3.1 pg/mL (ref 2.3–4.2)

## 2013-10-17 LAB — TSH: TSH: 0.167 u[IU]/mL — AB (ref 0.350–4.500)

## 2013-10-17 MED ORDER — ATORVASTATIN CALCIUM 40 MG PO TABS: 40.0000 mg | ORAL_TABLET | Freq: Every day | ORAL | Status: DC

## 2013-10-17 MED ORDER — ASPIRIN 81 MG PO TBEC: 81.0000 mg | DELAYED_RELEASE_TABLET | Freq: Every day | ORAL | Status: DC

## 2013-10-17 MED ORDER — ATORVASTATIN CALCIUM 40 MG PO TABS
40.0000 mg | ORAL_TABLET | Freq: Every day | ORAL | Status: DC
  Administered 2013-10-17: 40 mg via ORAL
  Filled 2013-10-17: qty 1

## 2013-10-17 MED ORDER — LIVING WELL WITH DIABETES BOOK
Freq: Once | Status: AC
  Administered 2013-10-17: 18:00:00
  Filled 2013-10-17: qty 1

## 2013-10-17 MED ORDER — ONDANSETRON HCL 4 MG PO TABS
4.0000 mg | ORAL_TABLET | Freq: Three times a day (TID) | ORAL | Status: DC | PRN
  Administered 2013-10-17: 4 mg via ORAL
  Filled 2013-10-17: qty 1

## 2013-10-17 MED ORDER — GLIMEPIRIDE 4 MG PO TABS
4.0000 mg | ORAL_TABLET | Freq: Every day | ORAL | Status: DC
  Filled 2013-10-17: qty 1

## 2013-10-17 MED ORDER — LABETALOL HCL 5 MG/ML IV SOLN
10.0000 mg | Freq: Once | INTRAVENOUS | Status: AC
  Administered 2013-10-17: 10 mg via INTRAVENOUS
  Filled 2013-10-17: qty 4

## 2013-10-17 MED ORDER — BENAZEPRIL HCL 40 MG PO TABS: 40.0000 mg | ORAL_TABLET | Freq: Every day | ORAL | Status: DC

## 2013-10-17 MED ORDER — BENAZEPRIL HCL 40 MG PO TABS
40.0000 mg | ORAL_TABLET | Freq: Every day | ORAL | Status: DC
  Administered 2013-10-17: 40 mg via ORAL
  Filled 2013-10-17: qty 1

## 2013-10-17 MED ORDER — HYDROCHLOROTHIAZIDE 25 MG PO TABS
25.0000 mg | ORAL_TABLET | Freq: Every day | ORAL | Status: DC
  Administered 2013-10-17: 25 mg via ORAL
  Filled 2013-10-17: qty 1

## 2013-10-17 MED ORDER — GLIMEPIRIDE 2 MG PO TABS: 2.0000 mg | ORAL_TABLET | Freq: Every day | ORAL | Status: DC

## 2013-10-17 MED ORDER — BENAZEPRIL-HYDROCHLOROTHIAZIDE 20-12.5 MG PO TABS: 2.0000 | ORAL_TABLET | Freq: Every day | ORAL | Status: DC

## 2013-10-17 MED ORDER — GLIMEPIRIDE 2 MG PO TABS
2.0000 mg | ORAL_TABLET | Freq: Every day | ORAL | Status: DC
  Filled 2013-10-17: qty 1

## 2013-10-17 MED ORDER — HYDROCHLOROTHIAZIDE 25 MG PO TABS: 25.0000 mg | ORAL_TABLET | Freq: Every day | ORAL | Status: DC

## 2013-10-17 MED ORDER — ACETAMINOPHEN 500 MG PO TABS
1000.0000 mg | ORAL_TABLET | Freq: Three times a day (TID) | ORAL | Status: DC | PRN
  Administered 2013-10-17: 1000 mg via ORAL
  Filled 2013-10-17: qty 2

## 2013-10-17 NOTE — Discharge Instructions (Signed)
[name], you were admitted because of your symptoms that were concerning for a transient ischemic attack vs stroke and for your extremely high blood pressure. We worked you up and found a stroke which was not recent. We are starting you on a cholesterol lowering drug (atorvastatin) and aspirin. For your blood pressure, we are starting you on benazepril and hydrochlorothiazide. Please take these medications every day. We have set up an appointment for you to follow-up on Monday, April 13th. Information will be supplied in your discharge paperwork.

## 2013-10-17 NOTE — Progress Notes (Signed)
Family Medicine Teaching Service Daily Progress Note Intern Pager: [phone]  Patient name: Chris Vaughn Medical record number: [id number] Date of birth: 14-Jun-1956 Age: 58 y.o. Gender: male  Primary Care Provider: Gildardo Cranker, DO Consultants: Neuorolgy Code Status: Full  Pt Overview and Major Events to Date:  4/8: HTN emergency, Possible TIA/CVA, Hx of CP  Assessment and Plan: Chris Vaughn is a 58 y.o. male presenting with hypertensive emergency and CVA/TIA . PMH is significant for HTN, DM2, poor adherence to treatment plans / follow-up with PCP recommendations.   # ?CVA/TIA vs HTN encephalopathy: patient does not have any focal neurologic symptoms. CT shows right lower pons infarct which is possibly acute. Patient has uncontrolled hypertension, currently in hypertensive emergency, and diabetes. Patient states no history of hyperlipidemia, although recent direct LDL is 132.  - Recent (3/17) seen at his PCP and had been out of several of his BP medications (Coreg 25 BID, Benazepril 40 mg q) due to financial difficulties. Look like ACE was supposed to be continued and Norvasc started but patient had not filled Rx's yet  Carotid doppler: Bilateral: 1-39% ICA stenosis. Right vertebral artery flow is atypical but antegrade. Left vertebral artery flow is antegrade.  lipid panel: Tchol 204, HDL45, LDL116; ASCVD Risk ~ 50% - Lipitor 40mg  qhs started 4/9 Consult neurology   MRI/MRA: today  Echocardiogram: pending reading PT/OT    # Hypertensive emergency: patient came in with blood pressures 216/127. Has evidence of chronic renal disease with creatinine slightly improved from previously and now .  allowed permissive hypertension 210/110 for 24hrs; hydralazine for BPs >210/110  BP last 24: 158-216)/(80-127) 190/109 mmHg  Will start back Benazepril 40mg  qd and start HCTZ 25 mg qd  # History of chest pain: concern for possible ischemic etiology; history suggests stable angina.  Patient currently not having chest pain. EKG not suggestive of acute MI. Initial troponin negative. Possible musculoskeletal since position sometimes worsens pain but still concern for ischemia  Troponin Neg x 3  EKG: NSR w/ LVH and prolonged QT 500  # Low TSH - Possible hyperthyroidism vs euthyroid sick - In the setting hypertension will check Free T3,T4  # Diabetes mellitus: last A1C of 7.4 on 09/24/13. Currently not on medication. PCP planned to start metformin if renal function allowed or Amaryl if Cr not improved.  Start Amaryl 2mg  qam Plan to monitor CBGs   # CKD stage 3: creatinine today: 1.56, down from 1.85. GFR: 54.   FEN/GI: SLIV, Heart healthy diet  Prophylaxis: SCDs   Disposition: Admit to telemetry  Subjective:  Complains of HA overnight, otherwise denies CP, SOB.   Objective: Temp:  [97.7 F (36.5 C)-99.3 F (37.4 C)] 99.3 F (37.4 C) (04/09 0500) Pulse Rate:  [91-114] 98 (04/09 0500) Resp:  [15-30] 16 (04/09 0326) BP: (158-216)/(80-127) 190/109 mmHg (04/09 0500) SpO2:  [95 %-99 %] 95 % (04/09 0500) Weight:  [285 lb (129.275 kg)] 285 lb (129.275 kg) (04/08 1558) Physical Exam: General: Laying in bed, in no acute distress HEENT: PERRL  Cardiovascular: Regular rate and rhythm. No m/r/g; No carotid bruit.  Respiratory: Clear to auscultation bilaterally, no wheezes, no increased work of breathing  Abdomen: Soft, non-tender, obese, BS+  Extremities: warm and well perfused  Skin: no cyanosis, no rashes   Laboratory:  Recent Labs Lab 10/16/13 1221  WBC 4.8  HGB 14.8  HCT 42.8  PLT 241    Recent Labs Lab 10/16/13 1221  NA 138  K 4.0  CL 99  CO2 24  BUN 19  CREATININE 1.56*  CALCIUM 9.6  PROT 7.6  BILITOT 0.3  ALKPHOS 81  ALT 23  AST 30  GLUCOSE 217*     Recent Labs Lab 10/16/13 1221 10/16/13 1913 10/17/13 0140  TROPONINI <0.30 <0.30 <0.30   A1c 7.9 TSH: 0.167  Imaging/Diagnostic Tests:  CT Head 4/8 FINDINGS:  Ventricles are  normal in size and configuration. There is no mass,  hemorrhage, extra-axial fluid collection, or midline shift. There is  patchy small vessel disease in the centra semiovale bilaterally.  There is a focal area of decreased attenuation in the mid to lower  right pons which may represent a recent infarct. No other findings  suggesting recent infarct. Bony calvarium appears intact. Visualized  mastoid air cells are clear.  IMPRESSION:  Question recent infarct lower right pons. There is patchy small  vessel disease in the centra semiovale bilaterally. No hemorrhage or  mass effect.  Wenda LowJames Ottis Sarnowski, MD 10/17/2013, 7:14 AM PGY-1, [facility] FPTS Intern pager: [phone], text pages welcome

## 2013-10-17 NOTE — Evaluation (Signed)
Occupational Therapy Evaluation Patient Details Name: Chris Vaughn MRN: [id number] DOB: 09/04/1955 Today's Date: 10/17/2013    History of Present Illness Chris Vaughn is a 58 y.o. male presenting with hypertensive emergency and CVA/TIA .  CT showed Question recent infarct lower right pons   Clinical Impression   Patient evaluated by Occupational Therapy with no further acute OT needs identified. All education has been completed and the patient has no further questions. Pt appears to be at baseline.  He is independent with BADLs, no deficits noted. No follow up recommended and no DME recommended.  OT is signing off. Thank you for this referral.     Follow Up Recommendations  No OT follow up    Equipment Recommendations  None recommended by OT    Recommendations for Other Services       Precautions / Restrictions        Mobility Bed Mobility Overal bed mobility: Independent             General bed mobility comments: VCs for increased time  Transfers Overall transfer level: Independent                    Balance Overall balance assessment: Independent (performed simulated shower in standing )                               Standardized Balance Assessment Standardized Balance Assessment : Dynamic Gait Index   Dynamic Gait Index Level Surface: Normal Change in Gait Speed: Normal Gait with Horizontal Head Turns: Normal Gait with Vertical Head Turns: Normal Gait and Pivot Turn: Normal Step Over Obstacle: Normal Step Around Obstacles: Normal Steps: Normal Total Score: 24      ADL Overall ADL's : Independent                                             Vision Eye Alignment: Within Functional Limits Alignment/Gaze Preference: Within Defined Limits Ocular Range of Motion: Within Functional Limits Tracking/Visual Pursuits: Able to track stimulus in all quads without difficulty Saccades: Within functional  limits           Perception     Praxis Praxis Praxis tested?: Within functional limits    Pertinent Vitals/Pain Pt denies pain.      Hand Dominance Right   Extremity/Trunk Assessment Upper Extremity Assessment Upper Extremity Assessment: Overall WFL for tasks assessed   Lower Extremity Assessment Lower Extremity Assessment: Defer to PT evaluation   Cervical / Trunk Assessment Cervical / Trunk Assessment: Normal   Communication Communication Communication: No difficulties   Cognition Arousal/Alertness: Awake/alert Behavior During Therapy: WFL for tasks assessed/performed Overall Cognitive Status: Within Functional Limits for tasks assessed                     General Comments       Exercises       Shoulder Instructions      Home Living Family/patient expects to be discharged to:: Private residence Living Arrangements: Spouse/significant other Available Help at Discharge: Family Type of Home: Apartment Home Access: Stairs to enter Secretary/administratorntrance Stairs-Number of Steps: 5 Entrance Stairs-Rails: Can reach both Home Layout: One level     Bathroom Shower/Tub: Tub/shower unit;Curtain Shower/tub characteristics: Engineer, building servicesCurtain Bathroom Toilet: Standard     Home Equipment: None  Additional Comments: tub shower, standard height      Prior Functioning/Environment Level of Independence: Independent        Comments: Pt drives    OT Diagnosis:     OT Problem List:     OT Treatment/Interventions:      OT Goals(Current goals can be found in the care plan section)    OT Frequency:     Barriers to D/C:            Co-evaluation              End of Session    Activity Tolerance: Patient tolerated treatment well Patient left: in bed;with call bell/phone within reach;with family/visitor present   Time: 1039-1100 OT Time Calculation (min): 21 min Charges:  OT General Charges $OT Visit: 1 Procedure OT Evaluation $Initial OT Evaluation Tier  I: 1 Procedure OT Treatments $Self Care/Home Management : 8-22 mins G-Codes:    Chris Vaughn Nov 11, 2013, 11:29 AM

## 2013-10-17 NOTE — Progress Notes (Signed)
I have seen and examined this patient. I have discussed with Dr Joyner.  I agree with their findings and plans as documented in their progress note.    

## 2013-10-17 NOTE — Progress Notes (Signed)
Echo Lab  2D Echocardiogram completed.  Claris Pech L Casmir Auguste, RDCS 10/17/2013 9:43 AM

## 2013-10-17 NOTE — Progress Notes (Signed)
I have seen and evaluated [name] and appreciate the excellent inpatient management provided but the FMTS.  Agree with plan, needs better and more compliance with medications for his HTN and now secondary prevention for CVA.  I will follow up in the outpatient setting.  Thanks, Chris FirstBryan R. Paulina FusiHess, DO [facility] 10/17/2013, 4:07 PM

## 2013-10-17 NOTE — Progress Notes (Signed)
CM CONSULT  Talked to patient about DCP/ patient has an established PCP but is having some difficulties getting his medication/ financial issues; [facility]. [facility]- they will assist the patient with his medication; prescription to be faxed to outpatient pharmacy at discharge to [phone]; Outpatient pharmacy close at 6pm but the patient can still pick up his medication tomorrow; All information given to patient and Attending MD; Alexis GoodellB Noelene Gang RN,BSN,MHA [phone]

## 2013-10-17 NOTE — Progress Notes (Signed)
Subjective: Patient is feeling much better today and states his HA is 1/10.  No further tingling in his hands.   LDL 116 A1c in process Carotid dopplers 1-39% stenosis bilaterally Echo pending MRI/MRA pending  Objective: Current vital signs: BP 189/121  Pulse 105  Temp(Src) 98.7 F (37.1 C) (Oral)  Resp 16  Ht 6\' 3"  (1.905 m)  Wt 129.275 kg (285 lb)  BMI 35.62 kg/m2  SpO2 98% Vital signs in last 24 hours: Temp:  [97.7 F (36.5 C)-99.3 F (37.4 C)] 98.7 F (37.1 C) (04/09 1028) Pulse Rate:  [91-114] 105 (04/09 1028) Resp:  [15-30] 16 (04/09 1028) BP: (158-216)/(80-127) 189/121 mmHg (04/09 1028) SpO2:  [95 %-99 %] 98 % (04/09 1028) Weight:  [129.275 kg (285 lb)] 129.275 kg (285 lb) (04/08 1558)  Intake/Output from previous day: 04/08 0701 - 04/09 0700 In: 360 [P.O.:360] Out: 825 [Urine:825] Intake/Output this shift:   Nutritional status: Cardiac  Neurologic Exam: Mental Status: Alert, oriented, thought content appropriate.  Speech fluent without evidence of aphasia.  Able to follow 3 step commands without difficulty. Cranial Nerves: II: Discs flat bilaterally; Visual fields grossly normal, pupils equal, round, reactive to light and accommodation III,IV, VI: ptosis not present, extra-ocular motions intact bilaterally V,VII: smile symmetric, facial light touch sensation normal bilaterally VIII: hearing normal bilaterally IX,X: gag reflex present XI: bilateral shoulder shrug XII: midline tongue extension without atrophy or fasciculations  Motor: Right : Upper extremity   5/5    Left:     Upper extremity   5/5  Lower extremity   5/5     Lower extremity   5/5 Tone and bulk:normal tone throughout; no atrophy noted Sensory: Pinprick and light touch intact throughout, bilaterally Deep Tendon Reflexes:  1+ throughout UE and no KJ and no AJ  Plantars: Right: downgoing   Left: downgoing Cerebellar: normal finger-to-nose,  normal heel-to-shin test Gait: normal CV:  pulses palpable throughout    Lab Results: Basic Metabolic Panel:  Recent Labs Lab 10/16/13 1221  NA 138  K 4.0  CL 99  CO2 24  GLUCOSE 217*  BUN 19  CREATININE 1.56*  CALCIUM 9.6    Liver Function Tests:  Recent Labs Lab 10/16/13 1221  AST 30  ALT 23  ALKPHOS 81  BILITOT 0.3  PROT 7.6  ALBUMIN 3.8   No results found for this basename: LIPASE, AMYLASE,  in the last 168 hours No results found for this basename: AMMONIA,  in the last 168 hours  CBC:  Recent Labs Lab 10/16/13 1221 10/17/13 0845  WBC 4.8 6.1  HGB 14.8 16.1  HCT 42.8 46.3  MCV 82.1 81.7  PLT 241 290    Cardiac Enzymes:  Recent Labs Lab 10/16/13 1221 10/16/13 1913 10/17/13 0140  TROPONINI <0.30 <0.30 <0.30    Lipid Panel:  Recent Labs Lab 10/17/13 0140  CHOL 204*  TRIG 217*  HDL 45  CHOLHDL 4.5  VLDL 43*  LDLCALC 116*    CBG: No results found for this basename: GLUCAP,  in the last 168 hours  Microbiology: No results found for this or any previous visit.  Coagulation Studies:  Recent Labs  10/16/13 1221  LABPROT 12.9  INR 0.99    Imaging: Ct Head Wo Contrast  10/16/2013   CLINICAL DATA:  Dizziness  EXAM: CT HEAD WITHOUT CONTRAST  TECHNIQUE: Contiguous axial images were obtained from the base of the skull through the vertex without intravenous contrast. Study was obtained within 24 hr of patient's arrival at  the emergency department.  COMPARISON:  None.  FINDINGS: Ventricles are normal in size and configuration. There is no mass, hemorrhage, extra-axial fluid collection, or midline shift. There is patchy small vessel disease in the centra semiovale bilaterally. There is a focal area of decreased attenuation in the mid to lower right pons which may represent a recent infarct. No other findings suggesting recent infarct. Bony calvarium appears intact. Visualized mastoid air cells are clear.  IMPRESSION: Question recent infarct lower right pons. There is patchy small vessel  disease in the centra semiovale bilaterally. No hemorrhage or mass effect.   Electronically Signed   By: Bretta Bang M.D.   On: 10/16/2013 13:20    Medications:  Scheduled: . aspirin EC  81 mg Oral Daily  . atorvastatin  40 mg Oral q1800  . [START ON 10/18/2013] glimepiride  4 mg Oral Q breakfast    Assessment/Plan: Symptoms have resolved.  Work up to date is unremarkable.  Awaiting MRI and echo.  Recommendations: 1.  Will continue to follow up TIA work up.   2.  Continue neuro checks.    LOS: 1 day   Thana Farr, MD [facility] [phone]  10/17/2013  10:59 AM

## 2013-10-17 NOTE — Evaluation (Signed)
Physical Therapy Evaluation Patient Details Name: Chris Vaughn MRN: [id number] DOB: 1955/10/01 Today's Date: 10/17/2013   History of Present Illness  Chris Vaughn is a 58 y.o. male presenting with hypertensive emergency and CVA/TIA   Clinical Impression  Patient with no significant deficits, independent with mobility, no further acute PT needs. Will sign off. Patient and spouse in agreement. Education provided [id number] Stroke education.    Follow Up Recommendations No PT follow up    Equipment Recommendations  None recommended by PT    Recommendations for Other Services       Precautions / Restrictions        Mobility  Bed Mobility Overal bed mobility: Modified Independent             General bed mobility comments: VCs for increased time  Transfers Overall transfer level: Modified independent                  Ambulation/Gait Ambulation/Gait assistance: Modified independent (Device/Increase time) Ambulation Distance (Feet): 640 Feet Assistive device: None Gait Pattern/deviations: WFL(Within Functional Limits);Step-through pattern (occassional LLE antalgic gait ) Gait velocity: WFL Gait velocity interpretation: at or above normal speed for age/gender General Gait Details: steady with ambulation, occassional LLE limp during gait but modest at worst, performed cognitive tasks such as spelling words backwards, navigating directions and reading signage without change or impact on gait  Stairs Stairs: Yes Stairs assistance: Modified independent (Device/Increase time) Stair Management: Two rails Number of Stairs: 5 General stair comments: no difficulty  Wheelchair Mobility    Modified Rankin (Stroke Patients Only) Modified Rankin (Stroke Patients Only) Pre-Morbid Rankin Score: No symptoms Modified Rankin: No significant disability     Balance Overall balance assessment: Modified Independent                                Standardized Balance Assessment Standardized Balance Assessment : Dynamic Gait Index   Dynamic Gait Index Level Surface: Normal Change in Gait Speed: Normal Gait with Horizontal Head Turns: Normal Gait with Vertical Head Turns: Normal Gait and Pivot Turn: Normal Step Over Obstacle: Normal Step Around Obstacles: Normal Steps: Normal Total Score: 24       Pertinent Vitals/Pain Mild dizziness upon standing, resolves with rest, no pain    Home Living Family/patient expects to be discharged to:: Private residence Living Arrangements: Spouse/significant other Available Help at Discharge: Family Type of Home: Apartment Home Access: Stairs to enter Entrance Stairs-Rails: Can reach both Entrance Stairs-Number of Steps: 5 Home Layout: One level Home Equipment: None Additional Comments: tub shower, standard height    Prior Function Level of Independence: Independent               Hand Dominance   Dominant Hand: Right    Extremity/Trunk Assessment   Upper Extremity Assessment: Defer to OT evaluation           Lower Extremity Assessment: Overall WFL for tasks assessed         Communication   Communication: No difficulties  Cognition Arousal/Alertness: Awake/alert Behavior During Therapy: WFL for tasks assessed/performed                        General Comments General comments (skin integrity, edema, etc.): educated patient and spouse extensively on BEFAST stroke symptoms    Exercises        Assessment/Plan    PT Assessment Patent does not need any  further PT services  PT Diagnosis     PT Problem List    PT Treatment Interventions     PT Goals (Current goals can be found in the Care Plan section) Acute Rehab PT Goals PT Goal Formulation: No goals set, d/c therapy    Frequency     Barriers to discharge        Co-evaluation               End of Session Equipment Utilized During Treatment: Gait belt Activity Tolerance:  Patient tolerated treatment well Patient left: in bed;with bed alarm set;with family/visitor present Nurse Communication: Mobility status         Time: [id number] PT Time Calculation (min): 27 min   Charges:   PT Evaluation $Initial PT Evaluation Tier I: 1 Procedure PT Treatments $Gait Training: 8-22 mins $Self Care/Home Management: 8-22   PT G Codes:          Fabio AsaDevon J Asuka Dusseau 10/17/2013, 9:10 AM  Charlotte Crumbevon Oswin Griffith, PT DPT  [phone]

## 2013-10-17 NOTE — Progress Notes (Signed)
Diabetes video shown to Pt and family; "Living Well with Diabetes" book and Stroke/TIA education material handout given. Explanation provided and all questions answered.

## 2013-10-17 NOTE — Discharge Summary (Signed)
Family Medicine Teaching [facility] Discharge Summary  Patient name: Chris Vaughn Medical record number: [id number] Date of birth: 1956/02/02 Age: 58 y.o. Gender: male Date of Admission: 10/16/2013  Date of Discharge: 10/17/13 Admitting Physician: Janit PaganKehinde Eniola, MD  Primary Care Provider: Gildardo CrankerHess, Bryan, DO Consultants: Neurology  Indication for Hospitalization: HTN emergency  Discharge Diagnoses/Problem List:  HTN emergency DM2 CKD stage 3a  Disposition: home  Discharge Condition: stable  Brief Hospital Course:  Chris Vaughn is a 58 y.o. male presenting with hypertensive emergency and possible CVA on  CT Head. PMH is significant for HTN, DM2, poor adherence to treatment plans / follow-up with PCP recommendations due to no insurance and financial difficulties. Permissive HTN was allowed for 24 hrs after onset of TIA-like symptoms. MRI was negative for CVA and BP medications were initiated: Restarted Benazepril 40 mg qd, and started HCTZ 25 mg qd. Carotid doppler showed Bilateral: 1-39% ICA stenosis. Right vertebral artery flow is atypical but antegrade. Left vertebral artery flow is antegrade. ECHO revealed EF 60-65% w/ normal wall motion and no valvular pathology. His ASCVD risk was calculated to be ~ 50% and Lipitor 40mg  qhs was started.    # Low TSH: Likely euthyroid sick syndrome; Free T3 & T4 were wnl.  # Diabetes mellitus: last A1C of 7.4 on 09/24/13. Currently not on medication. PCP planned to start metformin if renal function allowed or Amaryl if Cr not improved. Cr was > 1.5 on admission wo Amaryl 2mg  was started  Issues for Follow Up:  1. Check blood pressure at follow-up. Consider restarting amlodipine to regimen of still uncontrolled.  2. Patient does not have insurance and requires high-intensity statin. Will benefit from MAP/Orange card 3. Check CBGs and inc Amaryl if needed  Significant Procedures: none  Significant Labs and Imaging:   Recent Labs Lab  10/16/13 1221 10/17/13 0845  WBC 4.8 6.1  HGB 14.8 16.1  HCT 42.8 46.3  PLT 241 290    Recent Labs Lab 10/16/13 1221 10/17/13 1325  NA 138 134*  K 4.0 3.8  CL 99 93*  CO2 24 23  GLUCOSE 217* 178*  BUN 19 14  CREATININE 1.56* 1.43*  CALCIUM 9.6 9.3  ALKPHOS 81  --   AST 30  --   ALT 23  --   ALBUMIN 3.8  --      Recent Labs Lab 10/16/13 1221 10/16/13 1913 10/17/13 0140  TROPONINI <0.30 <0.30 <0.30   A1c: 7.9 T3 & T4: wnl  CT Head 4/8  FINDINGS:  Ventricles are normal in size and configuration. There is no mass,  hemorrhage, extra-axial fluid collection, or midline shift. There is  patchy small vessel disease in the centra semiovale bilaterally.  There is a focal area of decreased attenuation in the mid to lower  right pons which may represent a recent infarct. No other findings  suggesting recent infarct. Bony calvarium appears intact. Visualized  mastoid air cells are clear.  IMPRESSION:  Question recent infarct lower right pons. There is patchy small  vessel disease in the centra semiovale bilaterally. No hemorrhage or  mass effect.  MRI HEAD FINDINGS 10/17/13 No evidence for acute infarction, hemorrhage, mass lesion,  hydrocephalus, or extra-axial fluid. Normal for age cerebral volume.  Extensive subcortical and periventricular white matter signal  abnormality, likely chronic microvascular ischemic change. Less  prominent ischemic demyelination of the pons. Tiny foci of chronic  hemorrhage right putamen likely reflective of hypertensive cerebral  vascular disease. Pituitary, pineal, and cerebellar  tonsils  unremarkable. No upper cervical lesions. Flow voids are maintained.  Visualized calvarium, skull base, and upper cervical osseous  structures unremarkable. Scalp and extracranial soft tissues,  orbits, sinuses, and mastoids show no acute process.  Compared with prior CT, the area questioned of acute right pons  hypodensity appears artifactual.  MRA  HEAD FINDINGS  Internal carotid arteries are widely patent. There is mild non  stenotic narrowing of the mid basilar extending for a roughly 2 cm  segment between the anterior inferior cerebellar arteries and the  superior cerebellar arteries. Left vertebral is the sole contributor  to the basilar with right vertebral ending in PICA.  No proximal stenosis of the anterior, middle, or posterior cerebral  arteries scouts that no proximal stenosis of the anterior or middle  cerebral arteries. Mild non stenotic narrowing of the bilateral PCA  P2 segments. Left AICA poorly visualized. Poor visualization of both  PICAs.  IMPRESSION:  No acute stroke. No evidence for hypertensive encephalopathy.  Extensive white matter changes and scattered microbleeds consistent  with hypertensive cerebral vascular disease.  Mid basilar narrowing and irregularity, non stenotic. No  flow-limiting carotid lesion.   Results/Tests Pending at Time of Discharge: none  Discharge Medications:    Medication List    STOP taking these medications       amLODipine 5 MG tablet  Commonly known as:  NORVASC      TAKE these medications       aspirin 81 MG EC tablet  Take 1 tablet (81 mg total) by mouth daily.     atorvastatin 40 MG tablet  Commonly known as:  LIPITOR  Take 1 tablet (40 mg total) by mouth daily at 6 PM.     benazepril-hydrochlorthiazide 20-12.5 MG per tablet  Commonly known as:  LOTENSIN HCT  Take 2 tablets by mouth daily.     glimepiride 2 MG tablet  Commonly known as:  AMARYL  Take 1 tablet (2 mg total) by mouth daily with breakfast.  Start taking on:  10/18/2013        Discharge Instructions: Please refer to Patient Instructions section of EMR for full details.  Patient was counseled important signs and symptoms that should prompt return to medical care, changes in medications, dietary instructions, activity restrictions, and follow up appointments.   Follow-Up  Appointments: Follow-up Information   Follow up with Marena Chancy, MD On 10/21/2013. (11:15AM, For hospital follow-up)    Specialty:  Family Medicine   Contact information:   [address] [place] [address] [phone]      Wenda Low, MD 10/17/2013, 10:49 PM PGY-1, [facility]

## 2013-10-18 NOTE — Discharge Summary (Signed)
I have seen and examined this patient. I have discussed with Dr Joyner.  I agree with their findings and plans as documented in their discharge note.    

## 2013-10-21 ENCOUNTER — Encounter: Payer: Self-pay | Admitting: Family Medicine

## 2013-10-21 ENCOUNTER — Ambulatory Visit (INDEPENDENT_AMBULATORY_CARE_PROVIDER_SITE_OTHER): Payer: Self-pay | Admitting: Family Medicine

## 2013-10-21 VITALS — BP 162/99 | HR 96 | Temp 98.9°F | Ht 75.0 in | Wt 278.0 lb

## 2013-10-21 DIAGNOSIS — I10 Essential (primary) hypertension: Secondary | ICD-10-CM

## 2013-10-21 DIAGNOSIS — I161 Hypertensive emergency: Secondary | ICD-10-CM

## 2013-10-21 DIAGNOSIS — E119 Type 2 diabetes mellitus without complications: Secondary | ICD-10-CM

## 2013-10-21 LAB — GLUCOSE, CAPILLARY: GLUCOSE-CAPILLARY: 116 mg/dL — AB (ref 70–99)

## 2013-10-21 NOTE — Patient Instructions (Signed)
Please check your medications and check if the bottle shows: benazepril/hydrochlorothiazide 20/12.5. Please call me to let me know. If you are only taking one tablet of that medicine, I will increase it to two tablets. If you are already taking the maximum dose, I will add norvasc.   You are doing great taking your medications!

## 2013-10-22 ENCOUNTER — Telehealth: Payer: Self-pay | Admitting: Family Medicine

## 2013-10-22 DIAGNOSIS — I10 Essential (primary) hypertension: Secondary | ICD-10-CM

## 2013-10-22 MED ORDER — AMLODIPINE BESYLATE 5 MG PO TABS: 5.0000 mg | ORAL_TABLET | Freq: Every day | ORAL | Status: DC

## 2013-10-22 NOTE — Assessment & Plan Note (Signed)
CBG in clinic today of 116. Continue amaryl 2mg  daily for now.

## 2013-10-22 NOTE — Assessment & Plan Note (Signed)
BP not yet at goal but improved from recent hospital admission.  Patient seems to have actually been only taking benazepril/hctz 20/12.5mg  daily. Will therefore increase it to 2 tablets daily and have him follow up with Dr. Paulina FusiHess for BP recheck and possible BMP given increase in dose of benazepril.  If BP still not optimal, consider adding norvasc.

## 2013-10-22 NOTE — Progress Notes (Signed)
Patient ID: Chris Vaughn    DOB: January 28, 1956, 58 y.o.   MRN: [id number] --- Subjective:  Chris Vaughn is a 58 y.o.male who presents for hospital follow up. Patient was hospitalized on 10/17/13 for hypertensive emergency and possible CVA with headache and left sided weakness. He was restarted on benazepril 40mg  and hctz 25mg  daily. He was also started on lipitor 40mg  daily.  Since his discharge from the hospital, he reports doing better. He denies any significant headache, weakness or change in vision. No chest pain, no shortness of breath, no lower extremity swelling. He doesn't check BP at home, but reports compliance with his medications every day.  He has been taking benazepril/hctz 20/12.5mg  one tablet, as he was not sure whether he should be taking one or two. He also has been taking lipitor, asa and amaryl 2mg  daily. He doesn't check CBG's at home.   ROS: see HPI Past Medical History: reviewed and updated medications and allergies. Social History: Tobacco: none  Objective: Filed Vitals:   10/21/13 1117  BP: 162/99  Pulse: 96  Temp: 98.9 F (37.2 C)   Repeat manual BP: 158/100  Physical Examination:   General appearance - alert, well appearing, and in no distress Neck - supple, no significant adenopathy Chest - clear to auscultation, no wheezes, rales or rhonchi, symmetric air entry Heart - normal rate, regular rhythm, normal S1, S2, no murmurs Extremities - no lower extremity edema

## 2013-10-22 NOTE — Telephone Encounter (Signed)
Called patient back. He tells me that he has been taking benazepril 40mg  and hctz 25mg  daily.  Since he is on maximum dose of this, will add amlodipine 5mg  daily.  He is to follow up with Dr. Paulina FusiHess next week for titration of medication.  Instructed patient to bring his medication bottles with him at his appointment. He expressed understanding and agreed with plan.   Marena ChancyStephanie Caellum Mancil, PGY-3 Family Medicine Resident

## 2013-10-22 NOTE — Telephone Encounter (Signed)
Pt called to give Dr. Gwenlyn SaranLosq some information concerning his medications that she needed. Hydrochlorothiazide 25 mg and Benazepril 40 mg . Myriam Jacobsonjw

## 2013-10-30 ENCOUNTER — Ambulatory Visit: Payer: Self-pay | Admitting: Family Medicine

## 2013-11-13 ENCOUNTER — Encounter: Payer: Self-pay | Admitting: Family Medicine

## 2013-11-13 ENCOUNTER — Ambulatory Visit (INDEPENDENT_AMBULATORY_CARE_PROVIDER_SITE_OTHER): Payer: Self-pay | Admitting: Family Medicine

## 2013-11-13 VITALS — Temp 97.8°F | Ht 75.0 in | Wt 280.6 lb

## 2013-11-13 DIAGNOSIS — I639 Cerebral infarction, unspecified: Secondary | ICD-10-CM

## 2013-11-13 DIAGNOSIS — N183 Chronic kidney disease, stage 3 unspecified: Secondary | ICD-10-CM | POA: Insufficient documentation

## 2013-11-13 DIAGNOSIS — E785 Hyperlipidemia, unspecified: Secondary | ICD-10-CM

## 2013-11-13 DIAGNOSIS — E119 Type 2 diabetes mellitus without complications: Secondary | ICD-10-CM

## 2013-11-13 DIAGNOSIS — I10 Essential (primary) hypertension: Secondary | ICD-10-CM

## 2013-11-13 DIAGNOSIS — I6350 Cerebral infarction due to unspecified occlusion or stenosis of unspecified cerebral artery: Secondary | ICD-10-CM

## 2013-11-13 LAB — RENAL FUNCTION PANEL
ALBUMIN: 4.3 g/dL (ref 3.5–5.2)
BUN: 19 mg/dL (ref 6–23)
CHLORIDE: 99 meq/L (ref 96–112)
CO2: 25 mEq/L (ref 19–32)
Calcium: 9.7 mg/dL (ref 8.4–10.5)
Creat: 1.66 mg/dL — ABNORMAL HIGH (ref 0.50–1.35)
Glucose, Bld: 75 mg/dL (ref 70–99)
Phosphorus: 3.1 mg/dL (ref 2.3–4.6)
Potassium: 3.9 mEq/L (ref 3.5–5.3)
SODIUM: 136 meq/L (ref 135–145)

## 2013-11-13 LAB — MAGNESIUM: MAGNESIUM: 1.8 mg/dL (ref 1.5–2.5)

## 2013-11-13 MED ORDER — AMLODIPINE BESYLATE 10 MG PO TABS: 10.0000 mg | ORAL_TABLET | Freq: Every day | ORAL | Status: DC

## 2013-11-13 MED ORDER — BENAZEPRIL HCL 40 MG PO TABS: 40.0000 mg | ORAL_TABLET | Freq: Every day | ORAL | Status: DC

## 2013-11-13 MED ORDER — HYDROCHLOROTHIAZIDE 25 MG PO TABS: 25.0000 mg | ORAL_TABLET | Freq: Every day | ORAL | Status: DC

## 2013-11-13 MED ORDER — GLIMEPIRIDE 2 MG PO TABS: 2.0000 mg | ORAL_TABLET | Freq: Every day | ORAL | Status: DC

## 2013-11-13 MED ORDER — ATORVASTATIN CALCIUM 40 MG PO TABS: 40.0000 mg | ORAL_TABLET | Freq: Every day | ORAL | Status: DC

## 2013-11-13 NOTE — Assessment & Plan Note (Addendum)
MRI more c/w TIA, continue ASA 81 mg daily, HTN control, and statin

## 2013-11-13 NOTE — Progress Notes (Signed)
Chris Vaughn is a 58 y.o. who presents today for HTN and DM, and recent TIA in 10/2013 w/ negative MRI/MRA  Hx of Poorly controlled HTN.  Today's visit to 199/140 on recheck was 180/120.  No current HA, Diplopia, Blurred vision, CP, palpitations, abdominal pain, edema.  Had previously been on Lotensin 40 mg qd without SE from the drug including swelling, edema, cough, but has been out of it for about 1 months now.    TIA - No recent Sx, doing well on ASA 81 mg daily.    DM II - Taking Amaryl 2 mg daily, denies any hypoglycemic events.  Compliant with medication.     Past Medical History  Diagnosis Date  . Hypertension   . Depression   . Diabetes mellitus without complication     History   Social History  . Marital Status: Married   Social History Main Topics  . Smoking status: Never Smoker      Family History  Problem Relation Age of Onset  . Diabetes Mother   . Heart disease Mother   . Hypertension Father     Current Outpatient Prescriptions on File Prior to Visit  Medication Sig Dispense Refill  . amLODipine (NORVASC) 5 MG tablet Take 1 tablet (5 mg total) by mouth daily.  30 tablet  3  . aspirin EC 81 MG EC tablet Take 1 tablet (81 mg total) by mouth daily.  30 tablet  0  . atorvastatin (LIPITOR) 40 MG tablet Take 1 tablet (40 mg total) by mouth daily at 6 PM.  30 tablet  0  . benazepril-hydrochlorthiazide (LOTENSIN HCT) 20-12.5 MG per tablet Take 2 tablets by mouth daily.  60 tablet  0  . glimepiride (AMARYL) 2 MG tablet Take 1 tablet (2 mg total) by mouth daily with breakfast.  30 tablet  0   No current facility-administered medications on file prior to visit.   Physical Exam Filed Vitals:   11/13/13 1008  Temp: 97.8 F (36.6 C)    Gen: NAD, Well nourished, Well developed HEENT: PERLA Cardio: RRR, +1/6 SEM RUSB Lungs: CTA, no wheezes, rhonchi, crackles Abd: NABS, soft nontender nondistended MSK: ROM normal, no erythema, no joint effusions  Neuro: CN  2-12 intact, no focal neurologic deficits, negative romberg, gait appropriate Psych: AAO x 3, appropriate effect, appropriate mood Skin: no rashes, moles, skin tags Extremities: + 2 distal pulses, no edema B/L   Lab Results  Component Value Date   HGBA1C 7.9* 10/17/2013

## 2013-11-13 NOTE — Assessment & Plan Note (Signed)
On Lipitor 40, continue

## 2013-11-13 NOTE — Assessment & Plan Note (Addendum)
Consider future w/u to include Protein:Creatinine ratio or 24 hr urine, Renal US, PTH, Iron Studies, CBC, and renal panel.  Most likely 2/2 HTN and possible DM.

## 2013-11-13 NOTE — Assessment & Plan Note (Signed)
Continue with Amaryl 2 mg daily.  Consider repeat A1C in June

## 2013-11-13 NOTE — Assessment & Plan Note (Signed)
Pt repeat BP 176/110 after initial of 191/121.  Pt ASx, c/w HTN urgency at this point.  Explained necessity of better controlling his BP.  On max dose Benazepril, HCTZ, and will increase norvasc from 5 mg to 10 mg daily.  As well, if continues to be elevated will consider starting BB such as Coreg with Alpha blockage and then hydralazine and last clonidine.  Consider referral to Dr. Raymondo BandKoval in HTN clinic or secondary causes such as RAS, Pheo, or Conn's.  BMET today, f/u in two weeks unless becomes Sx, then to ED for evaluation.

## 2013-11-13 NOTE — Patient Instructions (Signed)
[name], it was nice seeing you today.  We will get lab work on you today and follow you up in two weeks.  Please start taking the norvasc 10 mg daily.

## 2013-11-19 ENCOUNTER — Telehealth: Payer: Self-pay | Admitting: Family Medicine

## 2013-11-19 NOTE — Telephone Encounter (Signed)
Pt is wondering if Dr Paulina Fusihess left benanazepril of of his prescription. Please advise

## 2013-11-19 NOTE — Telephone Encounter (Signed)
Spoke with patient and informed him that on 5/6 prescription was sent to [facility]

## 2013-11-28 ENCOUNTER — Ambulatory Visit (INDEPENDENT_AMBULATORY_CARE_PROVIDER_SITE_OTHER): Payer: Self-pay | Admitting: Family Medicine

## 2013-11-28 ENCOUNTER — Encounter: Payer: Self-pay | Admitting: Family Medicine

## 2013-11-28 VITALS — BP 146/76 | HR 70 | Temp 99.6°F | Ht 75.0 in | Wt 275.0 lb

## 2013-11-28 DIAGNOSIS — E119 Type 2 diabetes mellitus without complications: Secondary | ICD-10-CM

## 2013-11-28 DIAGNOSIS — E785 Hyperlipidemia, unspecified: Secondary | ICD-10-CM

## 2013-11-28 DIAGNOSIS — N183 Chronic kidney disease, stage 3 unspecified: Secondary | ICD-10-CM

## 2013-11-28 DIAGNOSIS — I10 Essential (primary) hypertension: Secondary | ICD-10-CM

## 2013-11-28 DIAGNOSIS — F4323 Adjustment disorder with mixed anxiety and depressed mood: Secondary | ICD-10-CM

## 2013-11-28 LAB — RENAL FUNCTION PANEL
ALBUMIN: 4.3 g/dL (ref 3.5–5.2)
BUN: 26 mg/dL — ABNORMAL HIGH (ref 6–23)
CO2: 27 mEq/L (ref 19–32)
Calcium: 10 mg/dL (ref 8.4–10.5)
Chloride: 99 mEq/L (ref 96–112)
Creat: 1.84 mg/dL — ABNORMAL HIGH (ref 0.50–1.35)
Glucose, Bld: 70 mg/dL (ref 70–99)
PHOSPHORUS: 3.6 mg/dL (ref 2.3–4.6)
Potassium: 3.7 mEq/L (ref 3.5–5.3)
SODIUM: 137 meq/L (ref 135–145)

## 2013-11-28 LAB — PROTEIN / CREATININE RATIO, URINE
CREATININE, URINE: 166.9 mg/dL
Protein Creatinine Ratio: 0.11 (ref <0.15)
Total Protein, Urine: 18 mg/dL

## 2013-11-28 NOTE — Patient Instructions (Signed)
Adjustment Disorder  Most changes in life can cause stress. Getting used to changes may take a few months or longer. If feelings of stress, hopelessness, or worry continue, you may have an adjustment disorder. This stress-related mental health problem may affect your feelings, thinking and how you act. It occurs in both sexes and happens at any age.  SYMPTOMS   Some of the following problems may be seen and vary from person to person:  · Sadness or depression.  · Loss of enjoyment.  · Thoughts of suicide.  · Fighting.  · Avoiding family and friends.  · Poor school performance.  · Hopelessness, sense of loss.  · Trouble sleeping.  · Vandalism.  · Worry, weight loss or gain.  · Crying spells.  · Anxiety  · Reckless driving.  · Skipping school.  · Poor work performance.  · Nervousness.  · Ignoring bills.  · Poor attitude.  DIAGNOSIS   Your caregiver will ask what has happened in your life and do a physical exam. They will make a diagnosis of an adjustment disorder when they are sure another problem or medical illness causing your feelings does not exist.  TREATMENT   When problems caused by stress interfere with you daily life or last longer than a few months, you may need counseling for an adjustment disorder. Early treatment may diminish problems and help you to better cope with the stressful events in your life. Sometimes medication is necessary. Individual counseling and or support groups can be very helpful.  PROGNOSIS   Adjustment disorders usually last less than 3 to 6 months. The condition may persist if there is long lasting stress. This could include health problems, relationship problems, or job difficulties where you can not easily escape from what is causing the problem.  PREVENTION   Even the most mentally healthy, highly functioning people can suffer from an adjustment disorder given a significant blow from a life-changing event. There is no way to prevent pain and loss. Most people need help from time  to time. You are not alone.  SEEK MEDICAL CARE IF:   Your feelings or symptoms listed above do not improve or worsen.  Document Released: 03/01/2006 Document Revised: 09/19/2011 Document Reviewed: 05/23/2007  [employer]® Patient Information ©2014 [employer].

## 2013-11-28 NOTE — Assessment & Plan Note (Signed)
Much better controlled today, BMET today and f/u in 4 weeks to recheck how he is doing.

## 2013-11-28 NOTE — Assessment & Plan Note (Signed)
COntinue amaryl 2 mg daily, consider repeat A1C in June.

## 2013-11-28 NOTE — Progress Notes (Signed)
Chris Vaughn is a 58 y.o. who presents today for HTN and DM, and recent TIA in 10/2013 w/ negative MRI/MRA  Hx of Poorly Controlled HTN - 146/76 today, which is good for him.  Continues on current regimen, denies any SE.    TIA - No recent Sx, doing well on ASA 81 mg daily.    DM II - Taking Amaryl 2 mg daily, denies any hypoglycemic events.  Compliant with medication.     Past Medical History  Diagnosis Date  . Hypertension   . Depression   . Diabetes mellitus without complication     History   Social History  . Marital Status: Married   Social History Main Topics  . Smoking status: Never Smoker      Family History  Problem Relation Age of Onset  . Diabetes Mother   . Heart disease Mother   . Hypertension Father     Current Outpatient Prescriptions on File Prior to Visit  Medication Sig Dispense Refill  . amLODipine (NORVASC) 10 MG tablet Take 1 tablet (10 mg total) by mouth daily.  90 tablet  3  . aspirin EC 81 MG EC tablet Take 1 tablet (81 mg total) by mouth daily.  30 tablet  0  . atorvastatin (LIPITOR) 40 MG tablet Take 1 tablet (40 mg total) by mouth daily at 6 PM.  30 tablet  11  . benazepril (LOTENSIN) 40 MG tablet Take 1 tablet (40 mg total) by mouth daily.  90 tablet  3  . glimepiride (AMARYL) 2 MG tablet Take 1 tablet (2 mg total) by mouth daily with breakfast.  90 tablet  3  . hydrochlorothiazide (HYDRODIURIL) 25 MG tablet Take 1 tablet (25 mg total) by mouth daily.  90 tablet  3   No current facility-administered medications on file prior to visit.   Physical Exam Filed Vitals:   11/28/13 1106  BP: 146/76  Pulse: 91  Temp: 99.6 F (37.6 C)    Gen: NAD, Well nourished, Well developed HEENT: PERLA Cardio: RRR, +1/6 SEM RUSB Lungs: CTA, no wheezes, rhonchi, crackles Abd: NABS, soft nontender nondistended MSK: ROM normal, no erythema, no joint effusions  Neuro: CN 2-12 intact, no focal neurologic deficits, negative romberg, gait  appropriate Psych: AAO x 3, appropriate effect, appropriate mood Skin: no rashes, moles, skin tags Extremities: + 2 distal pulses, no edema B/L   Lab Results  Component Value Date   HGBA1C 7.9* 10/17/2013

## 2013-11-28 NOTE — Assessment & Plan Note (Signed)
Continue Lipitor 40 mg s/p stroke, no myalgias.

## 2013-11-28 NOTE — Assessment & Plan Note (Signed)
Pt with increased stressor recently with CVA and son needing repeat Renal/Hepatic Transplants.  PHQ 9 today 20 today but denies SI/HI to myself.  Will f/u in one month and recheck, instructions to call 911 if SI/HI.

## 2013-11-28 NOTE — Assessment & Plan Note (Signed)
Consider future w/u to include Protein:Creatinine ratio or 24 hr urine, Renal US, PTH, Iron Studies, CBC, and renal panel.  Most likely 2/2 HTN and possible DM.  Renal Panel today.

## 2013-12-30 ENCOUNTER — Ambulatory Visit: Payer: Self-pay | Admitting: Family Medicine

## 2014-01-13 ENCOUNTER — Ambulatory Visit: Payer: Self-pay | Admitting: Family Medicine

## 2014-02-19 ENCOUNTER — Ambulatory Visit (INDEPENDENT_AMBULATORY_CARE_PROVIDER_SITE_OTHER): Payer: Self-pay | Admitting: Family Medicine

## 2014-02-19 ENCOUNTER — Encounter: Payer: Self-pay | Admitting: Family Medicine

## 2014-02-19 VITALS — BP 140/88 | HR 82 | Temp 99.0°F | Resp 16 | Wt 274.5 lb

## 2014-02-19 DIAGNOSIS — I10 Essential (primary) hypertension: Secondary | ICD-10-CM

## 2014-02-19 DIAGNOSIS — F4323 Adjustment disorder with mixed anxiety and depressed mood: Secondary | ICD-10-CM

## 2014-02-19 DIAGNOSIS — N183 Chronic kidney disease, stage 3 unspecified: Secondary | ICD-10-CM

## 2014-02-19 DIAGNOSIS — I6350 Cerebral infarction due to unspecified occlusion or stenosis of unspecified cerebral artery: Secondary | ICD-10-CM

## 2014-02-19 DIAGNOSIS — E119 Type 2 diabetes mellitus without complications: Secondary | ICD-10-CM

## 2014-02-19 DIAGNOSIS — I639 Cerebral infarction, unspecified: Secondary | ICD-10-CM

## 2014-02-19 DIAGNOSIS — E785 Hyperlipidemia, unspecified: Secondary | ICD-10-CM

## 2014-02-19 LAB — CBC WITH DIFFERENTIAL/PLATELET
Basophils Absolute: 0 10*3/uL (ref 0.0–0.1)
Basophils Relative: 0 % (ref 0–1)
Eosinophils Absolute: 0.1 10*3/uL (ref 0.0–0.7)
Eosinophils Relative: 1 % (ref 0–5)
HEMATOCRIT: 41.5 % (ref 39.0–52.0)
HEMOGLOBIN: 14.3 g/dL (ref 13.0–17.0)
LYMPHS ABS: 1.7 10*3/uL (ref 0.7–4.0)
LYMPHS PCT: 29 % (ref 12–46)
MCH: 28.3 pg (ref 26.0–34.0)
MCHC: 34.5 g/dL (ref 30.0–36.0)
MCV: 82.2 fL (ref 78.0–100.0)
MONO ABS: 0.8 10*3/uL (ref 0.1–1.0)
Monocytes Relative: 14 % — ABNORMAL HIGH (ref 3–12)
NEUTROS ABS: 3.3 10*3/uL (ref 1.7–7.7)
NEUTROS PCT: 56 % (ref 43–77)
Platelets: 296 10*3/uL (ref 150–400)
RBC: 5.05 MIL/uL (ref 4.22–5.81)
RDW: 16.3 % — ABNORMAL HIGH (ref 11.5–15.5)
WBC: 5.9 10*3/uL (ref 4.0–10.5)

## 2014-02-19 LAB — POCT GLYCOSYLATED HEMOGLOBIN (HGB A1C): HEMOGLOBIN A1C: 6

## 2014-02-19 LAB — COMPREHENSIVE METABOLIC PANEL
ALK PHOS: 63 U/L (ref 39–117)
ALT: 25 U/L (ref 0–53)
AST: 33 U/L (ref 0–37)
Albumin: 4.6 g/dL (ref 3.5–5.2)
BILIRUBIN TOTAL: 0.6 mg/dL (ref 0.2–1.2)
BUN: 28 mg/dL — AB (ref 6–23)
CO2: 28 mEq/L (ref 19–32)
Calcium: 9.9 mg/dL (ref 8.4–10.5)
Chloride: 100 mEq/L (ref 96–112)
Creat: 1.9 mg/dL — ABNORMAL HIGH (ref 0.50–1.35)
Glucose, Bld: 48 mg/dL — ABNORMAL LOW (ref 70–99)
POTASSIUM: 3.6 meq/L (ref 3.5–5.3)
Sodium: 138 mEq/L (ref 135–145)
Total Protein: 7.8 g/dL (ref 6.0–8.3)

## 2014-02-19 LAB — MAGNESIUM: Magnesium: 2 mg/dL (ref 1.5–2.5)

## 2014-02-19 LAB — PHOSPHORUS: PHOSPHORUS: 3.3 mg/dL (ref 2.3–4.6)

## 2014-02-19 NOTE — Assessment & Plan Note (Signed)
Much better controlled today, CMET today and f/u in 6 weeks to recheck how he is doing.

## 2014-02-19 NOTE — Assessment & Plan Note (Signed)
Doing well, less stress, most likely transient.  Continue to follow

## 2014-02-19 NOTE — Assessment & Plan Note (Signed)
Continue with Lotensin, Lipitor, and Amaryl.  A1C today

## 2014-02-19 NOTE — Assessment & Plan Note (Signed)
W/u to include Protein:Creatinine ratio, Renal US, CMET today.  Consider PTH, Iron Studies, and renal panel.  Most likely 2/2 HTN and possible DM.

## 2014-02-19 NOTE — Patient Instructions (Signed)
[name], keep up the good work.  We will see you back in about 6 weeks.  Thanks, Dr. Paulina FusiHess

## 2014-02-19 NOTE — Assessment & Plan Note (Signed)
Continue Lipitor 40 mg s/p stroke, no myalgias.  

## 2014-02-19 NOTE — Progress Notes (Signed)
Chris Vaughn is a 58 y.o. who presents today for HTN and DM, and recent TIA in 10/2013 w/ negative MRI/MRA  Hx of Poorly Controlled HTN - 140/88 today, which is good for him.  Continues on current regimen, denies any SE.    TIA - No recent Sx, doing well on ASA 81 mg daily.    DM II - Taking Amaryl 2 mg daily, denies any hypoglycemic events.  Compliant with medication.    Adjustment Disorder - Doing better since last visit, denies any SI/HI.     Past Medical History  Diagnosis Date  . Hypertension   . Depression   . Diabetes mellitus without complication     History   Social History  . Marital Status: Married   Social History Main Topics  . Smoking status: Never Smoker      Family History  Problem Relation Age of Onset  . Diabetes Mother   . Heart disease Mother   . Hypertension Father     Current Outpatient Prescriptions on File Prior to Visit  Medication Sig Dispense Refill  . amLODipine (NORVASC) 10 MG tablet Take 1 tablet (10 mg total) by mouth daily.  90 tablet  3  . aspirin EC 81 MG EC tablet Take 1 tablet (81 mg total) by mouth daily.  30 tablet  0  . atorvastatin (LIPITOR) 40 MG tablet Take 1 tablet (40 mg total) by mouth daily at 6 PM.  30 tablet  11  . benazepril (LOTENSIN) 40 MG tablet Take 1 tablet (40 mg total) by mouth daily.  90 tablet  3  . glimepiride (AMARYL) 2 MG tablet Take 1 tablet (2 mg total) by mouth daily with breakfast.  90 tablet  3  . hydrochlorothiazide (HYDRODIURIL) 25 MG tablet Take 1 tablet (25 mg total) by mouth daily.  90 tablet  3   No current facility-administered medications on file prior to visit.   Physical Exam Filed Vitals:   02/19/14 1104  BP: 140/88  Pulse: 82  Temp: 99 F (37.2 C)  Resp: 16    Gen: NAD, Well nourished, Well developed HEENT: PERLA Cardio: RRR, +1/6 SEM RUSB Lungs: CTA, no wheezes, rhonchi, crackles Abd: NABS, soft nontender nondistended MSK: ROM normal, no erythema, no joint effusions   Neuro: CN 2-12 intact, no focal neurologic deficits, negative romberg, gait appropriate Psych: AAO x 3, appropriate effect, appropriate mood Skin: no rashes, moles, skin tags Extremities: + 2 distal pulses, no edema B/L   Lab Results  Component Value Date   HGBA1C 7.9* 10/17/2013

## 2014-02-19 NOTE — Assessment & Plan Note (Signed)
Continue ASA, HTN control, Statin

## 2014-02-20 LAB — PROTEIN / CREATININE RATIO, URINE
Creatinine, Urine: 153.3 mg/dL
Protein Creatinine Ratio: 0.07 (ref <0.15)
Total Protein, Urine: 10 mg/dL

## 2014-03-31 ENCOUNTER — Encounter (HOSPITAL_COMMUNITY): Payer: Self-pay | Admitting: Emergency Medicine

## 2014-03-31 ENCOUNTER — Emergency Department (HOSPITAL_COMMUNITY)
Admission: EM | Admit: 2014-03-31 | Discharge: 2014-04-01 | Disposition: A | Discharge status: Home / Self Care | Payer: Self-pay | Attending: Emergency Medicine | Admitting: Emergency Medicine

## 2014-03-31 DIAGNOSIS — N289 Disorder of kidney and ureter, unspecified: Secondary | ICD-10-CM | POA: Insufficient documentation

## 2014-03-31 DIAGNOSIS — I10 Essential (primary) hypertension: Secondary | ICD-10-CM | POA: Insufficient documentation

## 2014-03-31 DIAGNOSIS — K625 Hemorrhage of anus and rectum: Secondary | ICD-10-CM | POA: Insufficient documentation

## 2014-03-31 DIAGNOSIS — R109 Unspecified abdominal pain: Secondary | ICD-10-CM | POA: Insufficient documentation

## 2014-03-31 DIAGNOSIS — Z79899 Other long term (current) drug therapy: Secondary | ICD-10-CM | POA: Insufficient documentation

## 2014-03-31 DIAGNOSIS — E119 Type 2 diabetes mellitus without complications: Secondary | ICD-10-CM | POA: Insufficient documentation

## 2014-03-31 DIAGNOSIS — Z7982 Long term (current) use of aspirin: Secondary | ICD-10-CM | POA: Insufficient documentation

## 2014-03-31 DIAGNOSIS — Z8659 Personal history of other mental and behavioral disorders: Secondary | ICD-10-CM | POA: Insufficient documentation

## 2014-03-31 LAB — CBC WITH DIFFERENTIAL/PLATELET
BASOS PCT: 0 % (ref 0–1)
Basophils Absolute: 0 10*3/uL (ref 0.0–0.1)
Eosinophils Absolute: 0.1 10*3/uL (ref 0.0–0.7)
Eosinophils Relative: 2 % (ref 0–5)
HCT: 40.1 % (ref 39.0–52.0)
HEMOGLOBIN: 13.8 g/dL (ref 13.0–17.0)
LYMPHS ABS: 1.7 10*3/uL (ref 0.7–4.0)
Lymphocytes Relative: 30 % (ref 12–46)
MCH: 29.1 pg (ref 26.0–34.0)
MCHC: 34.4 g/dL (ref 30.0–36.0)
MCV: 84.6 fL (ref 78.0–100.0)
MONOS PCT: 11 % (ref 3–12)
Monocytes Absolute: 0.6 10*3/uL (ref 0.1–1.0)
NEUTROS ABS: 3.3 10*3/uL (ref 1.7–7.7)
Neutrophils Relative %: 57 % (ref 43–77)
Platelets: 249 10*3/uL (ref 150–400)
RBC: 4.74 MIL/uL (ref 4.22–5.81)
RDW: 14.3 % (ref 11.5–15.5)
WBC: 5.8 10*3/uL (ref 4.0–10.5)

## 2014-03-31 LAB — COMPREHENSIVE METABOLIC PANEL
ALBUMIN: 3.8 g/dL (ref 3.5–5.2)
ALK PHOS: 76 U/L (ref 39–117)
ALT: 34 U/L (ref 0–53)
ANION GAP: 17 — AB (ref 5–15)
AST: 40 U/L — ABNORMAL HIGH (ref 0–37)
BUN: 28 mg/dL — ABNORMAL HIGH (ref 6–23)
CO2: 23 mEq/L (ref 19–32)
Calcium: 8.8 mg/dL (ref 8.4–10.5)
Chloride: 92 mEq/L — ABNORMAL LOW (ref 96–112)
Creatinine, Ser: 1.76 mg/dL — ABNORMAL HIGH (ref 0.50–1.35)
GFR calc Af Amer: 47 mL/min — ABNORMAL LOW (ref >90)
GFR calc non Af Amer: 40 mL/min — ABNORMAL LOW (ref >90)
GLUCOSE: 211 mg/dL — AB (ref 70–99)
POTASSIUM: 4.1 meq/L (ref 3.7–5.3)
SODIUM: 132 meq/L — AB (ref 137–147)
TOTAL PROTEIN: 7.3 g/dL (ref 6.0–8.3)
Total Bilirubin: 0.2 mg/dL — ABNORMAL LOW (ref 0.3–1.2)

## 2014-03-31 NOTE — ED Notes (Signed)
Pt reports constipation and bright red blood in stool. Pt reports blood on stool and on bath room tissue. Pt reports some dizziness since onset of blood in stool. Last BM today and hard.

## 2014-04-01 ENCOUNTER — Encounter: Payer: Self-pay | Admitting: Family Medicine

## 2014-04-01 ENCOUNTER — Ambulatory Visit (INDEPENDENT_AMBULATORY_CARE_PROVIDER_SITE_OTHER): Payer: Self-pay | Admitting: Family Medicine

## 2014-04-01 VITALS — BP 138/98 | HR 83 | Temp 98.6°F | Ht 75.0 in | Wt 281.1 lb

## 2014-04-01 DIAGNOSIS — I10 Essential (primary) hypertension: Secondary | ICD-10-CM

## 2014-04-01 DIAGNOSIS — N183 Chronic kidney disease, stage 3 unspecified: Secondary | ICD-10-CM

## 2014-04-01 DIAGNOSIS — K625 Hemorrhage of anus and rectum: Secondary | ICD-10-CM

## 2014-04-01 DIAGNOSIS — I639 Cerebral infarction, unspecified: Secondary | ICD-10-CM

## 2014-04-01 DIAGNOSIS — E871 Hypo-osmolality and hyponatremia: Secondary | ICD-10-CM | POA: Insufficient documentation

## 2014-04-01 DIAGNOSIS — I6350 Cerebral infarction due to unspecified occlusion or stenosis of unspecified cerebral artery: Secondary | ICD-10-CM

## 2014-04-01 DIAGNOSIS — Z23 Encounter for immunization: Secondary | ICD-10-CM

## 2014-04-01 LAB — I-STAT CG4 LACTIC ACID, ED: Lactic Acid, Venous: 0.51 mmol/L (ref 0.5–2.2)

## 2014-04-01 LAB — POC OCCULT BLOOD, ED: Fecal Occult Bld: POSITIVE — AB

## 2014-04-01 NOTE — ED Notes (Signed)
Lactic acid 0.51

## 2014-04-01 NOTE — Assessment & Plan Note (Signed)
CMET yesterday shows stable creatinine, continue to monitor, f/u in 2-3 months.

## 2014-04-01 NOTE — Progress Notes (Signed)
Chris Vaughn is a 58 y.o. who presents today for HTN and DM, and recent TIA in 10/2013 w/ negative MRI/MRA  Hx of Poorly Controlled HTN - 138/96 today, which is stable  Continues on current regimen, denies any SE.    TIA - No recent Sx, doing well on ASA 81 mg daily.    DM II - Taking Amaryl 2 mg daily, denies any hypoglycemic events.  Compliant with medication.    Adjustment Disorder - Doing better since last visit, denies any SI/HI.    Rectal Bleeding - Hemocult positive thought he saw blood in stool yesterday.  Seen at ED and recommended f/u in outpatient setting.  Denies lightheadedness, dizziness, blurred vision.    Past Medical History  Diagnosis Date  . Hypertension   . Depression   . Diabetes mellitus without complication     History   Social History  . Marital Status: Married   Social History Main Topics  . Smoking status: Never Smoker      Family History  Problem Relation Age of Onset  . Diabetes Mother   . Heart disease Mother   . Hypertension Father     Current Outpatient Prescriptions on File Prior to Visit  Medication Sig Dispense Refill  . amLODipine (NORVASC) 10 MG tablet Take 1 tablet (10 mg total) by mouth daily.  90 tablet  3  . aspirin EC 81 MG EC tablet Take 1 tablet (81 mg total) by mouth daily.  30 tablet  0  . atorvastatin (LIPITOR) 40 MG tablet Take 1 tablet (40 mg total) by mouth daily at 6 PM.  30 tablet  11  . benazepril (LOTENSIN) 40 MG tablet Take 1 tablet (40 mg total) by mouth daily.  90 tablet  3  . glimepiride (AMARYL) 2 MG tablet Take 1 tablet (2 mg total) by mouth daily with breakfast.  90 tablet  3  . hydrochlorothiazide (HYDRODIURIL) 25 MG tablet Take 1 tablet (25 mg total) by mouth daily.  90 tablet  3   No current facility-administered medications on file prior to visit.   Physical Exam Filed Vitals:   04/01/14 1359  BP: 138/98  Pulse: 83  Temp: 98.6 F (37 C)    Gen: NAD, Well nourished, Well developed HEENT:  PERLA Cardio: RRR, +1/6 SEM RUSB Lungs: CTA, no wheezes, rhonchi, crackles Abd: NABS, soft nontender nondistended MSK: ROM normal, no erythema, no joint effusions  Neuro: CN 2-12 intact, no focal neurologic deficits, negative romberg, gait appropriate Psych: AAO x 3, appropriate effect, appropriate mood Skin: no rashes, moles, skin tags Extremities: + 2 distal pulses, no edema B/L   Lab Results  Component Value Date   HGBA1C 6.0 02/19/2014

## 2014-04-01 NOTE — ED Notes (Signed)
MD at bedside. 

## 2014-04-01 NOTE — Assessment & Plan Note (Signed)
Lab work from yesterday shows Na of 132, Bicarb of 23, and Cl of 92.  Most likely 2/2 volume depletion from combination of PO intake and Thiazide diuretic.  Continue to monitor.

## 2014-04-01 NOTE — ED Provider Notes (Signed)
CSN: [id number]     Arrival date & time 03/31/14  2003 History   First MD Initiated Contact with Patient 03/31/14 2357     Chief Complaint  Patient presents with  . Abdominal Pain  . Blood In Stools     (Consider location/radiation/quality/duration/timing/severity/associated sxs/prior Treatment) Patient is a 58 y.o. male presenting with abdominal pain. The history is provided by the patient.  Abdominal Pain He states that he has been constipated for the past several days. He was straining at hard bowel movement this morning and had some blood mixed with the stool as well as on the toilet paper and in the bowl. Blood was bright red. This was associated with some crampy pain across the lower abdomen and into the lower back. Pain is moderate and he rated at 6/10. The cramping is still present but has eased off. He has not had any more bowel movements and has not passed any more blood since then. He denies nausea or vomiting. He denies dizziness or lightheadedness. Is not on any anticoagulants. He denies prior history of passing blood and he denies having had a colonoscopy.  Past Medical History  Diagnosis Date  . Hypertension   . Depression   . Diabetes mellitus without complication    Past Surgical History  Procedure Laterality Date  . Back surgery     Family History  Problem Relation Age of Onset  . Diabetes Mother   . Heart disease Mother   . Hypertension Father    History  Substance Use Topics  . Smoking status: Never Smoker   . Smokeless tobacco: Not on file  . Alcohol Use: No    Review of Systems  Gastrointestinal: Positive for abdominal pain.  All other systems reviewed and are negative.     Allergies  Review of patient's allergies indicates no known allergies.  Home Medications   Prior to Admission medications   Medication Sig Start Date End Date Taking? Authorizing Provider  amLODipine (NORVASC) 10 MG tablet Take 1 tablet (10 mg total) by mouth daily. 11/13/13    Briscoe Deutscher, DO  aspirin EC 81 MG EC tablet Take 1 tablet (81 mg total) by mouth daily. 10/17/13   Jacquelin Hawking, MD  atorvastatin (LIPITOR) 40 MG tablet Take 1 tablet (40 mg total) by mouth daily at 6 PM. 11/13/13   Twana First Hess, DO  benazepril (LOTENSIN) 40 MG tablet Take 1 tablet (40 mg total) by mouth daily. 11/13/13   Briscoe Deutscher, DO  glimepiride (AMARYL) 2 MG tablet Take 1 tablet (2 mg total) by mouth daily with breakfast. 11/13/13   Briscoe Deutscher, DO  hydrochlorothiazide (HYDRODIURIL) 25 MG tablet Take 1 tablet (25 mg total) by mouth daily. 11/13/13   Bryan R Hess, DO   BP 173/100  Pulse 103  Temp(Src) 98.6 F (37 C)  Resp 20  Wt 287 lb 8 oz (130.409 kg)  SpO2 98% Physical Exam  Nursing note and vitals reviewed.  57 year old male, resting comfortably and in no acute distress. Vital signs are significant for hypertension and borderline tachycardia. Oxygen saturation is 98%, which is normal. Head is normocephalic and atraumatic. PERRLA, EOMI. Oropharynx is clear. Neck is nontender and supple without adenopathy or JVD. Back is nontender and there is no CVA tenderness. Lungs are clear without rales, wheezes, or rhonchi. Chest is nontender. Heart has regular rate and rhythm without murmur. Abdomen is soft, flat, nontender without masses or hepatosplenomegaly and peristalsis is normoactive. Rectal: Normal  sphincter tone, no hemorrhoids, no masses. Stool is light brown but Hemoccult positive. Extremities have no cyanosis or edema, full range of motion is present. Skin is warm and dry without rash. Neurologic: Mental status is normal, cranial nerves are intact, there are no motor or sensory deficits.  ED Course  Procedures (including critical care time) Labs Review Results for orders placed during the hospital encounter of 03/31/14  CBC WITH DIFFERENTIAL      Result Value Ref Range   WBC 5.8  4.0 - 10.5 K/uL   RBC 4.74  4.22 - 5.81 MIL/uL   Hemoglobin 13.8  13.0 - 17.0 g/dL   HCT 16.1   09.6 - 04.5 %   MCV 84.6  78.0 - 100.0 fL   MCH 29.1  26.0 - 34.0 pg   MCHC 34.4  30.0 - 36.0 g/dL   RDW 40.9  81.1 - 91.4 %   Platelets 249  150 - 400 K/uL   Neutrophils Relative % 57  43 - 77 %   Neutro Abs 3.3  1.7 - 7.7 K/uL   Lymphocytes Relative 30  12 - 46 %   Lymphs Abs 1.7  0.7 - 4.0 K/uL   Monocytes Relative 11  3 - 12 %   Monocytes Absolute 0.6  0.1 - 1.0 K/uL   Eosinophils Relative 2  0 - 5 %   Eosinophils Absolute 0.1  0.0 - 0.7 K/uL   Basophils Relative 0  0 - 1 %   Basophils Absolute 0.0  0.0 - 0.1 K/uL  COMPREHENSIVE METABOLIC PANEL      Result Value Ref Range   Sodium 132 (*) 137 - 147 mEq/L   Potassium 4.1  3.7 - 5.3 mEq/L   Chloride 92 (*) 96 - 112 mEq/L   CO2 23  19 - 32 mEq/L   Glucose, Bld 211 (*) 70 - 99 mg/dL   BUN 28 (*) 6 - 23 mg/dL   Creatinine, Ser 7.82 (*) 0.50 - 1.35 mg/dL   Calcium 8.8  8.4 - 95.6 mg/dL   Total Protein 7.3  6.0 - 8.3 g/dL   Albumin 3.8  3.5 - 5.2 g/dL   AST 40 (*) 0 - 37 U/L   ALT 34  0 - 53 U/L   Alkaline Phosphatase 76  39 - 117 U/L   Total Bilirubin 0.2 (*) 0.3 - 1.2 mg/dL   GFR calc non Af Amer 40 (*) >90 mL/min   GFR calc Af Amer 47 (*) >90 mL/min   Anion gap 17 (*) 5 - 15  POC OCCULT BLOOD, ED      Result Value Ref Range   Fecal Occult Bld POSITIVE (*) NEGATIVE  I-STAT CG4 LACTIC ACID, ED      Result Value Ref Range   Lactic Acid, Venous 0.51  0.5 - 2.2 mmol/L    MDM   Final diagnoses:  Rectal bleeding  Renal insufficiency    Rectal bleeding of a mild degree. Hemoglobin has dropped slightly from 14.3-13.8. Renal insufficiency is present and basically unchanged. Orthostatic vital signs will be checked. Since he does not appear to have active bleeding, he may be able to have his evaluation done as an outpatient. Old records are reviewed and I do not see any prior episodes of GI bleeding.  With systemic vital signs showed no significant changes. He is discharged with instructions to followup in his clinic. He  states he has an appointment for later today. He will need outpatient sigmoidoscopy or  colonoscopy.  Dione Booze, MD 04/01/14 [phone]

## 2014-04-01 NOTE — Assessment & Plan Note (Signed)
CMET stable, P:C ratio under <0.30, and awaiting Renal US.  Can consider renal consult in future and iron studies, PTH, Renal Panel at that time.

## 2014-04-01 NOTE — Assessment & Plan Note (Signed)
Continue ASA, HTN control, Statin

## 2014-04-01 NOTE — Discharge Instructions (Signed)
Return if bleeding is getting worse, or if you start getting dizzy or light-headed.  Bloody Stools Bloody stools often mean that there is a problem in the digestive tract. Your caregiver may use the term "melena" to describe black, tarry, and bad smelling stools or "hematochezia" to describe red or maroon-colored stools. Blood seen in the stool can be caused by bleeding anywhere along the intestinal tract.  A black stool usually means that blood is coming from the upper part of the gastrointestinal tract (esophagus, stomach, or small bowel). Passing maroon-colored stools or bright red blood usually means that blood is coming from lower down in the large bowel or the rectum. However, sometimes massive bleeding in the stomach or small intestine can cause bright red bloody stools.  Consuming black licorice, lead, iron pills, medicines containing bismuth subsalicylate, or blueberries can also cause black stools. Your caregiver can test black stools to see if blood is present. It is important that the cause of the bleeding be found. Treatment can then be started, and the problem can be corrected. Rectal bleeding may not be serious, but you should not assume everything is okay until you know the cause.It is very important to follow up with your caregiver or a specialist in gastrointestinal problems. CAUSES  Blood in the stools can come from various underlying causes.Often, the cause is not found during your first visit. Testing is often needed to discover the cause of bleeding in the gastrointestinal tract. Causes range from simple to serious or even life-threatening.Possible causes include:  Hemorrhoids.These are veins that are full of blood (engorged) in the rectum. They cause pain, inflammation, and may bleed.  Anal fissures.These are areas of painful tearing which may bleed. They are often caused by passing hard stool.  Diverticulosis.These are pouches that form on the colon over time, with age,  and may bleed significantly.  Diverticulitis.This is inflammation in areas with diverticulosis. It can cause pain, fever, and bloody stools, although bleeding is rare.  Proctitis and colitis. These are inflamed areas of the rectum or colon. They may cause pain, fever, and bloody stools.  Polyps and cancer. Colon cancer is a leading cause of preventable cancer death.It often starts out as precancerous polyps that can be removed during a colonoscopy, preventing progression into cancer. Sometimes, polyps and cancer may cause rectal bleeding.  Gastritis and ulcers.Bleeding from the upper gastrointestinal tract (near the stomach) may travel through the intestines and produce black, sometimes tarry, often bad smelling stools. In certain cases, if the bleeding is fast enough, the stools may not be black, but red and the condition may be life-threatening. SYMPTOMS  You may have stools that are bright red and bloody, that are normal color with blood on them, or that are dark black and tarry. In some cases, you may only have blood in the toilet bowl. Any of these cases need medical care. You may also have:  Pain at the anus or anywhere in the rectum.  Lightheadedness or feeling faint.  Extreme weakness.  Nausea or vomiting.  Fever. DIAGNOSIS Your caregiver may use the following methods to find the cause of your bleeding:  Taking a medical history. Age is important. Older people tend to develop polyps and cancer more often. If there is anal pain and a hard, large stool associated with bleeding, a tear of the anus may be the cause. If blood drips into the toilet after a bowel movement, bleeding hemorrhoids may be the problem. The color and frequency of the bleeding  are additional considerations. In most cases, the medical history provides clues, but seldom the final answer.  A visual and finger (digital) exam. Your caregiver will inspect the anal area, looking for tears and hemorrhoids. A finger  exam can provide information when there is tenderness or a growth inside. In men, the prostate is also examined.  Endoscopy. Several types of small, long scopes (endoscopes) are used to view the colon.  In the office, your caregiver may use a rigid, or more commonly, a flexible viewing sigmoidoscope. This exam is called flexible sigmoidoscopy. It is performed in 5 to 10 minutes.  A more thorough exam is accomplished with a colonoscope. It allows your caregiver to view the entire 5 to 6 foot long colon. Medicine to help you relax (sedative) is usually given for this exam. Frequently, a bleeding lesion may be present beyond the reach of the sigmoidoscope. So, a colonoscopy may be the best exam to start with. Both exams are usually done on an outpatient basis. This means the patient does not stay overnight in the hospital or surgery center.  An upper endoscopy may be needed to examine your stomach. Sedation is used and a flexible endoscope is put in your mouth, down to your stomach.  A barium enema X-ray. This is an X-ray exam. It uses liquid barium inserted by enema into the rectum. This test alone may not identify an actual bleeding point. X-rays highlight abnormal shadows, such as those made by lumps (tumors), diverticuli, or colitis. TREATMENT  Treatment depends on the cause of your bleeding.   For bleeding from the stomach or colon, the caregiver doing your endoscopy or colonoscopy may be able to stop the bleeding as part of the procedure.  Inflammation or infection of the colon can be treated with medicines.  Many rectal problems can be treated with creams, suppositories, or warm baths.  Surgery is sometimes needed.  Blood transfusions are sometimes needed if you have lost a lot of blood.  For any bleeding problem, let your caregiver know if you take aspirin or other blood thinners regularly. HOME CARE INSTRUCTIONS   Take any medicines exactly as prescribed.  Keep your stools soft by  eating a diet high in fiber. Prunes (1 to 3 a day) work well for many people.  Drink enough water and fluids to keep your urine clear or pale yellow.  Take sitz baths if advised. A sitz bath is when you sit in a bathtub with warm water for 10 to 15 minutes to soak, soothe, and cleanse the rectal area.  If enemas or suppositories are advised, be sure you know how to use them. Tell your caregiver if you have problems with this.  Monitor your bowel movements to look for signs of improvement or worsening. SEEK MEDICAL CARE IF:   You do not improve in the time expected.  Your condition worsens after initial improvement.  You develop any new symptoms. SEEK IMMEDIATE MEDICAL CARE IF:   You develop severe or prolonged rectal bleeding.  You vomit blood.  You feel weak or faint.  You have a fever. MAKE SURE YOU:  Understand these instructions.  Will watch your condition.  Will get help right away if you are not doing well or get worse. Document Released: 06/17/2002 Document Revised: 09/19/2011 Document Reviewed: 11/12/2010 [facility] Patient Information 2015 [place], [place]. This information is not intended to replace advice given to you by your health care provider. Make sure you discuss any questions you have with your health  care provider.  

## 2014-04-01 NOTE — Patient Instructions (Signed)
Please call one of the numbers for your colonoscopy.  We will see you back in 2-3 months.  Thanks, Dr. Paulina Fusi

## 2014-04-01 NOTE — Assessment & Plan Note (Signed)
Hgb stable from lab, has not had colonoscopy done to this point.  Information and paper given for colonoscopy and highly recommended pt call [facility] or [facility] for this to be done.  Pt understands consequences and risks if he does not call for colonoscopy.

## 2014-09-24 ENCOUNTER — Encounter (HOSPITAL_COMMUNITY): Payer: Self-pay | Admitting: Emergency Medicine

## 2014-09-24 ENCOUNTER — Emergency Department (HOSPITAL_COMMUNITY)
Admission: EM | Admit: 2014-09-24 | Discharge: 2014-09-24 | Disposition: A | Discharge status: Home / Self Care | Payer: Self-pay | Attending: Emergency Medicine | Admitting: Emergency Medicine

## 2014-09-24 ENCOUNTER — Emergency Department (HOSPITAL_COMMUNITY): Payer: Self-pay

## 2014-09-24 DIAGNOSIS — R079 Chest pain, unspecified: Secondary | ICD-10-CM | POA: Insufficient documentation

## 2014-09-24 DIAGNOSIS — K5900 Constipation, unspecified: Secondary | ICD-10-CM | POA: Insufficient documentation

## 2014-09-24 DIAGNOSIS — Z79899 Other long term (current) drug therapy: Secondary | ICD-10-CM | POA: Insufficient documentation

## 2014-09-24 DIAGNOSIS — Z8673 Personal history of transient ischemic attack (TIA), and cerebral infarction without residual deficits: Secondary | ICD-10-CM | POA: Insufficient documentation

## 2014-09-24 DIAGNOSIS — Z7982 Long term (current) use of aspirin: Secondary | ICD-10-CM | POA: Insufficient documentation

## 2014-09-24 DIAGNOSIS — I10 Essential (primary) hypertension: Secondary | ICD-10-CM | POA: Insufficient documentation

## 2014-09-24 DIAGNOSIS — Z8659 Personal history of other mental and behavioral disorders: Secondary | ICD-10-CM | POA: Insufficient documentation

## 2014-09-24 DIAGNOSIS — E119 Type 2 diabetes mellitus without complications: Secondary | ICD-10-CM | POA: Insufficient documentation

## 2014-09-24 HISTORY — DX: Cerebral infarction, unspecified: I63.9

## 2014-09-24 LAB — CBC
HCT: 45 % (ref 39.0–52.0)
Hemoglobin: 15.5 g/dL (ref 13.0–17.0)
MCH: 29.3 pg (ref 26.0–34.0)
MCHC: 34.4 g/dL (ref 30.0–36.0)
MCV: 85.1 fL (ref 78.0–100.0)
PLATELETS: 261 10*3/uL (ref 150–400)
RBC: 5.29 MIL/uL (ref 4.22–5.81)
RDW: 14.2 % (ref 11.5–15.5)
WBC: 7.9 10*3/uL (ref 4.0–10.5)

## 2014-09-24 LAB — COMPREHENSIVE METABOLIC PANEL
ALK PHOS: 65 U/L (ref 39–117)
ALT: 32 U/L (ref 0–53)
AST: 38 U/L — AB (ref 0–37)
Albumin: 4.4 g/dL (ref 3.5–5.2)
Anion gap: 13 (ref 5–15)
BILIRUBIN TOTAL: 0.7 mg/dL (ref 0.3–1.2)
BUN: 21 mg/dL (ref 6–23)
CHLORIDE: 97 mmol/L (ref 96–112)
CO2: 27 mmol/L (ref 19–32)
Calcium: 9.5 mg/dL (ref 8.4–10.5)
Creatinine, Ser: 1.87 mg/dL — ABNORMAL HIGH (ref 0.50–1.35)
GFR calc Af Amer: 43 mL/min — ABNORMAL LOW (ref >90)
GFR, EST NON AFRICAN AMERICAN: 37 mL/min — AB (ref >90)
Glucose, Bld: 178 mg/dL — ABNORMAL HIGH (ref 70–99)
Potassium: 3.5 mmol/L (ref 3.5–5.1)
Sodium: 137 mmol/L (ref 135–145)
Total Protein: 7.9 g/dL (ref 6.0–8.3)

## 2014-09-24 LAB — BRAIN NATRIURETIC PEPTIDE: B NATRIURETIC PEPTIDE 5: 9.6 pg/mL (ref 0.0–100.0)

## 2014-09-24 LAB — I-STAT TROPONIN, ED: Troponin i, poc: 0 ng/mL (ref 0.00–0.08)

## 2014-09-24 LAB — LIPASE, BLOOD: Lipase: 40 U/L (ref 11–59)

## 2014-09-24 NOTE — ED Notes (Signed)
Pt reports sharp intermittent pain to R side of chest, R abd cramping, and pain to bilateral jaws/throat since 2:30pm.  Also reports sob, nausea, and dizziness.

## 2014-09-24 NOTE — Discharge Instructions (Signed)
Use MiraLAX, twice a day, until you have a good soft bowel movement. After that, use it once a day for 2 or 3 weeks.    Constipation Constipation is when a person has fewer than three bowel movements a week, has difficulty having a bowel movement, or has stools that are dry, hard, or larger than normal. As people grow older, constipation is more common. If you try to fix constipation with medicines that make you have a bowel movement (laxatives), the problem may get worse. Long-term laxative use may cause the muscles of the colon to become weak. A low-fiber diet, not taking in enough fluids, and taking certain medicines may make constipation worse.  CAUSES   Certain medicines, such as antidepressants, pain medicine, iron supplements, antacids, and water pills.   Certain diseases, such as diabetes, irritable bowel syndrome (IBS), thyroid disease, or depression.   Not drinking enough water.   Not eating enough fiber-rich foods.   Stress or travel.   Lack of physical activity or exercise.   Ignoring the urge to have a bowel movement.   Using laxatives too much.  SIGNS AND SYMPTOMS   Having fewer than three bowel movements a week.   Straining to have a bowel movement.   Having stools that are hard, dry, or larger than normal.   Feeling full or bloated.   Pain in the lower abdomen.   Not feeling relief after having a bowel movement.  DIAGNOSIS  Your health care provider will take a medical history and perform a physical exam. Further testing may be done for severe constipation. Some tests may include:  A barium enema X-ray to examine your rectum, colon, and, sometimes, your small intestine.   A sigmoidoscopy to examine your lower colon.   A colonoscopy to examine your entire colon. TREATMENT  Treatment will depend on the severity of your constipation and what is causing it. Some dietary treatments include drinking more fluids and eating more fiber-rich foods.  Lifestyle treatments may include regular exercise. If these diet and lifestyle recommendations do not help, your health care provider may recommend taking over-the-counter laxative medicines to help you have bowel movements. Prescription medicines may be prescribed if over-the-counter medicines do not work.  HOME CARE INSTRUCTIONS   Eat foods that have a lot of fiber, such as fruits, vegetables, whole grains, and beans.  Limit foods high in fat and processed sugars, such as french fries, hamburgers, cookies, candies, and soda.   A fiber supplement may be added to your diet if you cannot get enough fiber from foods.   Drink enough fluids to keep your urine clear or pale yellow.   Exercise regularly or as directed by your health care provider.   Go to the restroom when you have the urge to go. Do not hold it.   Only take over-the-counter or prescription medicines as directed by your health care provider. Do not take other medicines for constipation without talking to your health care provider first.  SEEK IMMEDIATE MEDICAL CARE IF:   You have bright red blood in your stool.   Your constipation lasts for more than 4 days or gets worse.   You have abdominal or rectal pain.   You have thin, pencil-like stools.   You have unexplained weight loss. MAKE SURE YOU:   Understand these instructions.  Will watch your condition.  Will get help right away if you are not doing well or get worse. Document Released: 03/25/2004 Document Revised: 07/02/2013 Document Reviewed: 04/08/2013  ExitCare Patient Information 2015 [place], [place]. This information is not intended to replace advice given to you by your health care provider. Make sure you discuss any questions you have with your health care provider.  Chest Pain (Nonspecific) It is often hard to give a specific diagnosis for the cause of chest pain. There is always a chance that your pain could be related to something serious, such as  a heart attack or a blood clot in the lungs. You need to follow up with your health care provider for further evaluation. CAUSES   Heartburn.  Pneumonia or bronchitis.  Anxiety or stress.  Inflammation around your heart (pericarditis) or lung (pleuritis or pleurisy).  A blood clot in the lung.  A collapsed lung (pneumothorax). It can develop suddenly on its own (spontaneous pneumothorax) or from trauma to the chest.  Shingles infection (herpes zoster virus). The chest wall is composed of bones, muscles, and cartilage. Any of these can be the source of the pain.  The bones can be bruised by injury.  The muscles or cartilage can be strained by coughing or overwork.  The cartilage can be affected by inflammation and become sore (costochondritis). DIAGNOSIS  Lab tests or other studies may be needed to find the cause of your pain. Your health care provider may have you take a test called an ambulatory electrocardiogram (ECG). An ECG records your heartbeat patterns over a 24-hour period. You may also have other tests, such as:  Transthoracic echocardiogram (TTE). During echocardiography, sound waves are used to evaluate how blood flows through your heart.  Transesophageal echocardiogram (TEE).  Cardiac monitoring. This allows your health care provider to monitor your heart rate and rhythm in real time.  Holter monitor. This is a portable device that records your heartbeat and can help diagnose heart arrhythmias. It allows your health care provider to track your heart activity for several days, if needed.  Stress tests by exercise or by giving medicine that makes the heart beat faster. TREATMENT   Treatment depends on what may be causing your chest pain. Treatment may include:  Acid blockers for heartburn.  Anti-inflammatory medicine.  Pain medicine for inflammatory conditions.  Antibiotics if an infection is present.  You may be advised to change lifestyle habits. This  includes stopping smoking and avoiding alcohol, caffeine, and chocolate.  You may be advised to keep your head raised (elevated) when sleeping. This reduces the chance of acid going backward from your stomach into your esophagus. Most of the time, nonspecific chest pain will improve within 2-3 days with rest and mild pain medicine.  HOME CARE INSTRUCTIONS   If antibiotics were prescribed, take them as directed. Finish them even if you start to feel better.  For the next few days, avoid physical activities that bring on chest pain. Continue physical activities as directed.  Do not use any tobacco products, including cigarettes, chewing tobacco, or electronic cigarettes.  Avoid drinking alcohol.  Only take medicine as directed by your health care provider.  Follow your health care provider's suggestions for further testing if your chest pain does not go away.  Keep any follow-up appointments you made. If you do not go to an appointment, you could develop lasting (chronic) problems with pain. If there is any problem keeping an appointment, call to reschedule. SEEK MEDICAL CARE IF:   Your chest pain does not go away, even after treatment.  You have a rash with blisters on your chest.  You have a fever. SEEK IMMEDIATE  MEDICAL CARE IF:   You have increased chest pain or pain that spreads to your arm, neck, jaw, back, or abdomen.  You have shortness of breath.  You have an increasing cough, or you cough up blood.  You have severe back or abdominal pain.  You feel nauseous or vomit.  You have severe weakness.  You faint.  You have chills. This is an emergency. Do not wait to see if the pain will go away. Get medical help at once. Call your local emergency services (911 in [place] Do not drive yourself to the hospital. MAKE SURE YOU:   Understand these instructions.  Will watch your condition.  Will get help right away if you are not doing well or get worse. Document  Released: 04/06/2005 Document Revised: 07/02/2013 Document Reviewed: 01/31/2008 [facility] Patient Information 2015 [place], [place]. This information is not intended to replace advice given to you by your health care provider. Make sure you discuss any questions you have with your health care provider.

## 2014-09-24 NOTE — ED Provider Notes (Signed)
CSN: [id number]     Arrival date & time 09/24/14  2016 History   First MD Initiated Contact with Patient 09/24/14 2214     Chief Complaint  Patient presents with  . Chest Pain     (Consider location/radiation/quality/duration/timing/severity/associated sxs/prior Treatment) HPI   Chris Vaughn is a 59 y.o. male who presents for evaluation of abdominal pain which is been present for several days, on and off and is associated with constipation. He also had 60 seconds worth of right-sided chest pain earlier today which did not have associated nausea, vomiting or diaphoresis. He is not taking anything for constipation. He denies fever, chills, cough, shortness of breath, weakness or dizziness. There are no other no modifying factors.   Past Medical History  Diagnosis Date  . Hypertension   . Depression   . Diabetes mellitus without complication   . Stroke    Past Surgical History  Procedure Laterality Date  . Back surgery     Family History  Problem Relation Age of Onset  . Diabetes Mother   . Heart disease Mother   . Hypertension Father    History  Substance Use Topics  . Smoking status: Never Smoker   . Smokeless tobacco: Not on file  . Alcohol Use: No    Review of Systems  All other systems reviewed and are negative.     Allergies  Review of patient's allergies indicates no known allergies.  Home Medications   Prior to Admission medications   Medication Sig Start Date End Date Taking? Authorizing Provider  amLODipine (NORVASC) 10 MG tablet Take 1 tablet (10 mg total) by mouth daily. 11/13/13   Briscoe Deutscher, DO  aspirin EC 81 MG EC tablet Take 1 tablet (81 mg total) by mouth daily. 10/17/13   Narda Bonds, MD  atorvastatin (LIPITOR) 40 MG tablet Take 1 tablet (40 mg total) by mouth daily at 6 PM. 11/13/13   Twana First Hess, DO  benazepril (LOTENSIN) 40 MG tablet Take 1 tablet (40 mg total) by mouth daily. 11/13/13   Briscoe Deutscher, DO  glimepiride (AMARYL) 2 MG tablet  Take 1 tablet (2 mg total) by mouth daily with breakfast. 11/13/13   Briscoe Deutscher, DO  hydrochlorothiazide (HYDRODIURIL) 25 MG tablet Take 1 tablet (25 mg total) by mouth daily. 11/13/13   Bryan R Hess, DO   BP 136/87 mmHg  Pulse 82  Temp(Src) 98.4 F (36.9 C) (Oral)  Resp 18  Ht  (1.905 m)  Wt 290 lb 8 oz (131.77 kg)  BMI 36.31 kg/m2  SpO2 99% Physical Exam  Constitutional: He is oriented to person, place, and time. He appears well-developed and well-nourished.  HENT:  Head: Normocephalic and atraumatic.  Right Ear: External ear normal.  Left Ear: External ear normal.  Eyes: Conjunctivae and EOM are normal. Pupils are equal, round, and reactive to light.  Neck: Normal range of motion and phonation normal. Neck supple.  Cardiovascular: Normal rate, regular rhythm and normal heart sounds.   Pulmonary/Chest: Effort normal and breath sounds normal. No respiratory distress. He exhibits no tenderness and no bony tenderness.  Abdominal: Soft. There is no tenderness. There is no rebound and no guarding.  Musculoskeletal: Normal range of motion.  Neurological: He is alert and oriented to person, place, and time. No cranial nerve deficit or sensory deficit. He exhibits normal muscle tone. Coordination normal.  Skin: Skin is warm, dry and intact.  Psychiatric: He has a normal mood and affect.  His behavior is normal. Judgment and thought content normal.  Nursing note and vitals reviewed.   ED Course  Procedures (including critical care time) Labs Review Labs Reviewed  COMPREHENSIVE METABOLIC PANEL - Abnormal; Notable for the following:    Glucose, Bld 178 (*)    Creatinine, Ser 1.87 (*)    AST 38 (*)    GFR calc non Af Amer 37 (*)    GFR calc Af Amer 43 (*)    All other components within normal limits  CBC  BRAIN NATRIURETIC PEPTIDE  LIPASE, BLOOD  I-STAT TROPOININ, ED   Component     Latest Ref Rng 03/31/2014 09/24/2014  Sodium     135 - 145 mmol/L 132 (L) 137  Potassium      3.5 - 5.1 mmol/L 4.1 3.5  Chloride     96 - 112 mmol/L 92 (L) 97  CO2     19 - 32 mmol/L 23 27  Glucose     70 - 99 mg/dL 454 (H) 098 (H)  BUN     6 - 23 mg/dL 28 (H) 21  Creatinine     0.50 - 1.35 mg/dL 1.19 (H) 1.47 (H)  Calcium     8.4 - 10.5 mg/dL 8.8 9.5  Total Protein     6.0 - 8.3 g/dL 7.3 7.9  Albumin     3.5 - 5.2 g/dL 3.8 4.4  AST     0 - 37 U/L 40 (H) 38 (H)  ALT     0 - 53 U/L 34 32  Alkaline Phosphatase     39 - 117 U/L 76 65  Total Bilirubin     0.3 - 1.2 mg/dL 0.2 (L) 0.7  GFR calc non Af Amer     >90 mL/min 40 (L) 37 (L)  GFR calc Af Amer     >90 mL/min 47 (L) 43 (L)  Anion gap     5 - 15 17 (H) 13     Imaging Review Dg Chest 2 View  09/24/2014   CLINICAL DATA:  Right-sided chest pain for several days radiating into right neck region. One day history of shortness of breath  EXAM: CHEST  2 VIEW  COMPARISON:  None.  FINDINGS: There is no edema or consolidation. Heart size and pulmonary vascularity are normal. No adenopathy. No pneumothorax. There is degenerative change in the thoracic spine with diffuse idiopathic skeletal hyperostosis.  IMPRESSION: No edema or consolidation.   Electronically Signed   By: Bretta Bang III M.D.   On: 09/24/2014 21:40     EKG Interpretation   Date/Time:  Wednesday September 24 2014 20:22:38 EDT Ventricular Rate:  113 PR Interval:  164 QRS Duration: 92 QT Interval:  350 QTC Calculation: 480 R Axis:   -23 Text Interpretation:  Sinus tachycardia Otherwise normal ECG Since last  tracing rate faster Confirmed by Chance Munter  MD, Camillo Quadros [phone]) on 09/24/2014  10:30:06 PM      MDM   Final diagnoses:  Constipation, unspecified constipation type  Nonspecific chest pain    Abdominal pain related to constipation. Doubt colitis, serious bacterial infection or metabolic instability. Nonspecific transient chest pain for 60 seconds. Unlikely to represent an acute coronary abnormality. Doubt ACS, PE, or pneumonia.  Nursing Notes  Reviewed/ Care Coordinated Applicable Imaging Reviewed Interpretation of Laboratory Data incorporated into ED treatment  The patient appears reasonably screened and/or stabilized for discharge and I doubt any other medical condition or other [facility] requiring further screening, evaluation, or  treatment in the ED at this time prior to discharge.  Plan: Home Medications- Miralax; Home Treatments- REST, increase fiber; return here if the recommended treatment, does not improve the symptoms; Recommended follow up- PCP prn    Mancel BaleElliott Giavanna Kang, MD 09/25/14 [phone]

## 2014-09-24 NOTE — ED Notes (Signed)
Pt made aware to return if symptoms worsen or if any life threatening symptoms occur.   

## 2014-10-07 ENCOUNTER — Other Ambulatory Visit: Payer: Self-pay | Admitting: Family Medicine

## 2014-11-06 ENCOUNTER — Other Ambulatory Visit: Payer: Self-pay | Admitting: *Deleted

## 2014-11-06 ENCOUNTER — Telehealth: Payer: Self-pay | Admitting: Family Medicine

## 2014-11-06 ENCOUNTER — Emergency Department (HOSPITAL_COMMUNITY)
Admission: EM | Admit: 2014-11-06 | Discharge: 2014-11-06 | Disposition: A | Discharge status: Home / Self Care | Payer: Self-pay | Attending: Emergency Medicine | Admitting: Emergency Medicine

## 2014-11-06 ENCOUNTER — Encounter (HOSPITAL_COMMUNITY): Payer: Self-pay | Admitting: *Deleted

## 2014-11-06 DIAGNOSIS — R200 Anesthesia of skin: Secondary | ICD-10-CM | POA: Insufficient documentation

## 2014-11-06 DIAGNOSIS — I10 Essential (primary) hypertension: Secondary | ICD-10-CM | POA: Insufficient documentation

## 2014-11-06 DIAGNOSIS — R7989 Other specified abnormal findings of blood chemistry: Secondary | ICD-10-CM | POA: Insufficient documentation

## 2014-11-06 DIAGNOSIS — Z8673 Personal history of transient ischemic attack (TIA), and cerebral infarction without residual deficits: Secondary | ICD-10-CM | POA: Insufficient documentation

## 2014-11-06 DIAGNOSIS — H538 Other visual disturbances: Secondary | ICD-10-CM | POA: Insufficient documentation

## 2014-11-06 DIAGNOSIS — F419 Anxiety disorder, unspecified: Secondary | ICD-10-CM | POA: Insufficient documentation

## 2014-11-06 DIAGNOSIS — E119 Type 2 diabetes mellitus without complications: Secondary | ICD-10-CM | POA: Insufficient documentation

## 2014-11-06 LAB — CBC
HCT: 42.1 % (ref 39.0–52.0)
Hemoglobin: 14.7 g/dL (ref 13.0–17.0)
MCH: 29.1 pg (ref 26.0–34.0)
MCHC: 34.9 g/dL (ref 30.0–36.0)
MCV: 83.2 fL (ref 78.0–100.0)
Platelets: 259 10*3/uL (ref 150–400)
RBC: 5.06 MIL/uL (ref 4.22–5.81)
RDW: 13.7 % (ref 11.5–15.5)
WBC: 6.9 10*3/uL (ref 4.0–10.5)

## 2014-11-06 LAB — COMPREHENSIVE METABOLIC PANEL
ALBUMIN: 4 g/dL (ref 3.5–5.2)
ALK PHOS: 81 U/L (ref 39–117)
ALT: 32 U/L (ref 0–53)
ANION GAP: 11 (ref 5–15)
AST: 40 U/L — AB (ref 0–37)
BUN: 25 mg/dL — AB (ref 6–23)
CO2: 24 mmol/L (ref 19–32)
Calcium: 9 mg/dL (ref 8.4–10.5)
Chloride: 96 mmol/L (ref 96–112)
Creatinine, Ser: 1.86 mg/dL — ABNORMAL HIGH (ref 0.50–1.35)
GFR calc Af Amer: 43 mL/min — ABNORMAL LOW (ref >90)
GFR calc non Af Amer: 37 mL/min — ABNORMAL LOW (ref >90)
Glucose, Bld: 268 mg/dL — ABNORMAL HIGH (ref 70–99)
POTASSIUM: 3.4 mmol/L — AB (ref 3.5–5.1)
SODIUM: 131 mmol/L — AB (ref 135–145)
TOTAL PROTEIN: 7 g/dL (ref 6.0–8.3)
Total Bilirubin: 1 mg/dL (ref 0.3–1.2)

## 2014-11-06 MED ORDER — GLIMEPIRIDE 2 MG PO TABS: 2.0000 mg | ORAL_TABLET | Freq: Every day | ORAL | Status: DC

## 2014-11-06 MED ORDER — LORAZEPAM 1 MG PO TABS
2.0000 mg | ORAL_TABLET | Freq: Once | ORAL | Status: AC
  Administered 2014-11-06: 2 mg via ORAL
  Filled 2014-11-06: qty 2

## 2014-11-06 MED ORDER — LORAZEPAM 1 MG PO TABS: 1.0000 mg | ORAL_TABLET | Freq: Three times a day (TID) | ORAL | Status: DC | PRN

## 2014-11-06 MED ORDER — HYDROCHLOROTHIAZIDE 25 MG PO TABS: 25.0000 mg | ORAL_TABLET | Freq: Every day | ORAL | Status: DC

## 2014-11-06 NOTE — Discharge Instructions (Signed)
Panic Attacks °Panic attacks are sudden, short-lived surges of severe anxiety, fear, or discomfort. They may occur for no reason when you are relaxed, when you are anxious, or when you are sleeping. Panic attacks may occur for a number of reasons:  °· Healthy people occasionally have panic attacks in extreme, life-threatening situations, such as war or natural disasters. Normal anxiety is a protective mechanism of the body that helps us react to danger (fight or flight response). °· Panic attacks are often seen with anxiety disorders, such as panic disorder, social anxiety disorder, generalized anxiety disorder, and phobias. Anxiety disorders cause excessive or uncontrollable anxiety. They may interfere with your relationships or other life activities. °· Panic attacks are sometimes seen with other mental illnesses, such as depression and posttraumatic stress disorder. °· Certain medical conditions, prescription medicines, and drugs of abuse can cause panic attacks. °SYMPTOMS  °Panic attacks start suddenly, peak within 20 minutes, and are accompanied by four or more of the following symptoms: °· Pounding heart or fast heart rate (palpitations). °· Sweating. °· Trembling or shaking. °· Shortness of breath or feeling smothered. °· Feeling choked. °· Chest pain or discomfort. °· Nausea or strange feeling in your stomach. °· Dizziness, light-headedness, or feeling like you will faint. °· Chills or hot flushes. °· Numbness or tingling in your lips or hands and feet. °· Feeling that things are not real or feeling that you are not yourself. °· Fear of losing control or going crazy. °· Fear of dying. °Some of these symptoms can mimic serious medical conditions. For example, you may think you are having a heart attack. Although panic attacks can be very scary, they are not life threatening. °DIAGNOSIS  °Panic attacks are diagnosed through an assessment by your health care provider. Your health care provider will ask  questions about your symptoms, such as where and when they occurred. Your health care provider will also ask about your medical history and use of alcohol and drugs, including prescription medicines. Your health care provider may order blood tests or other studies to rule out a serious medical condition. Your health care provider may refer you to a mental health professional for further evaluation. °TREATMENT  °· Most healthy people who have one or two panic attacks in an extreme, life-threatening situation will not require treatment. °· The treatment for panic attacks associated with anxiety disorders or other mental illness typically involves counseling with a mental health professional, medicine, or a combination of both. Your health care provider will help determine what treatment is best for you. °· Panic attacks due to physical illness usually go away with treatment of the illness. If prescription medicine is causing panic attacks, talk with your health care provider about stopping the medicine, decreasing the dose, or substituting another medicine. °· Panic attacks due to alcohol or drug abuse go away with abstinence. Some adults need professional help in order to stop drinking or using drugs. °HOME CARE INSTRUCTIONS  °· Take all medicines as directed by your health care provider.   °· Schedule and attend follow-up visits as directed by your health care provider. It is important to keep all your appointments. °SEEK MEDICAL CARE IF: °· You are not able to take your medicines as prescribed. °· Your symptoms do not improve or get worse. °SEEK IMMEDIATE MEDICAL CARE IF:  °· You experience panic attack symptoms that are different than your usual symptoms. °· You have serious thoughts about hurting yourself or others. °· You are taking medicine for panic attacks and   have a serious side effect. MAKE SURE YOU:  Understand these instructions.  Will watch your condition.  Will get help right away if you are not  doing well or get worse. Document Released: 06/27/2005 Document Revised: 07/02/2013 Document Reviewed: 02/08/2013 [facility] Patient Information 2015 [place], [place]. This information is not intended to replace advice given to you by your health care provider. Make sure you discuss any questions you have with your health care provider.  Please use medication only as directed for panic attacks. Do not drive or drink with taking the medication.  follow-up with your primary care provider as soon as possible for further evaluation and management of ongoing anxiety and panic attacks. He developed any concerning or new symptoms please return to ED for further evaluation and management.

## 2014-11-06 NOTE — ED Notes (Signed)
Pressure also taken on the right arm 170/101  Tried different cuff but pressure remained high

## 2014-11-06 NOTE — ED Provider Notes (Signed)
CSN: [id number]     Arrival date & time 11/06/14  1754 History   First MD Initiated Contact with Patient 11/06/14 1855     Chief Complaint  Patient presents with  . Dizziness  . Numbness     HPI   59 year old male presents today with episodic complaints of blurred vision, numbness and cold sensation to his hands, dizziness, tachypnea. Patient reports this is been going on for the past 3 weeks. Patient also notes that recently he's had significant life events causing extreme stress and anxiety in his life. He reports that these events that he has coincide with him thinking about stressful things. He reports they last only a few minutes, come on abruptly, with complete resolution in between events. During the events patient denies chest pain, palpitations, diaphoresis, nausea, vomiting. He does note indigestion during these episodes. It hurts a history of hypertension, denies smoking history, drug use, history of cardiovascular events, significant family history of cardiovascular events. Patient has no additional concerns other than those stated above.   Past Medical History  Diagnosis Date  . Hypertension   . Depression   . Diabetes mellitus without complication   . Stroke    Past Surgical History  Procedure Laterality Date  . Back surgery     Family History  Problem Relation Age of Onset  . Diabetes Mother   . Heart disease Mother   . Hypertension Father    History  Substance Use Topics  . Smoking status: Never Smoker   . Smokeless tobacco: Not on file  . Alcohol Use: No    Review of Systems  All other systems reviewed and are negative.   Allergies  Review of patient's allergies indicates no known allergies.  Home Medications   Prior to Admission medications   Medication Sig Start Date End Date Taking? Authorizing Provider  amLODipine (NORVASC) 10 MG tablet Take 1 tablet (10 mg total) by mouth daily. 11/13/13   Briscoe DeutscherBryan R Hess, DO  aspirin EC 81 MG EC tablet Take 1 tablet  (81 mg total) by mouth daily. 10/17/13   Narda Bondsalph A Nettey, MD  atorvastatin (LIPITOR) 40 MG tablet Take 1 tablet (40 mg total) by mouth daily at 6 PM. 11/13/13   Briscoe DeutscherBryan R Hess, DO  benazepril (LOTENSIN) 40 MG tablet TAKE ONE TABLET BY MOUTH ONCE DAILY 10/07/14   Briscoe DeutscherBryan R Hess, DO  glimepiride (AMARYL) 2 MG tablet Take 1 tablet (2 mg total) by mouth daily with breakfast. 11/06/14   Briscoe DeutscherBryan R Hess, DO  hydrochlorothiazide (HYDRODIURIL) 25 MG tablet Take 1 tablet (25 mg total) by mouth daily. 11/06/14   Bryan R Hess, DO   BP 178/100 mmHg  Pulse 112  Temp(Src) 98.6 F (37 C) (Oral)  Resp 18  Ht 6\' 3"  (1.905 m)  Wt 287 lb (130.182 kg)  BMI 35.87 kg/m2  SpO2 100% Physical Exam  Constitutional: He is oriented to person, place, and time. He appears well-developed and well-nourished.  HENT:  Head: Normocephalic and atraumatic.  Eyes: Pupils are equal, round, and reactive to light.  Neck: Normal range of motion. Neck supple. No JVD present. No tracheal deviation present. No thyromegaly present.  Cardiovascular: Regular rhythm, normal heart sounds and intact distal pulses.  Exam reveals no gallop and no friction rub.   No murmur heard. Pulmonary/Chest: Effort normal and breath sounds normal. No stridor. No respiratory distress. He has no wheezes. He has no rales. He exhibits no tenderness.  Abdominal: Soft. There is no tenderness.  Musculoskeletal: Normal range of motion.  Lymphadenopathy:    He has no cervical adenopathy.  Neurological: He is alert and oriented to person, place, and time. Coordination normal.  Skin: Skin is warm and dry.  Psychiatric: He has a normal mood and affect. His behavior is normal. Judgment and thought content normal.  Nursing note and vitals reviewed.   ED Course  Procedures (including critical care time) Labs Review Labs Reviewed  COMPREHENSIVE METABOLIC PANEL - Abnormal; Notable for the following:    Sodium 131 (*)    Potassium 3.4 (*)    Glucose, Bld 268 (*)    BUN 25  (*)    Creatinine, Ser 1.86 (*)    AST 40 (*)    GFR calc non Af Amer 37 (*)    GFR calc Af Amer 43 (*)    All other components within normal limits  CBC    Imaging Review No results found.   EKG Interpretation   Date/Time:  Thursday November 06 2014 18:03:53 EDT Ventricular Rate:  121 PR Interval:  160 QRS Duration: 96 QT Interval:  330 QTC Calculation: 468 R Axis:   -23 Text Interpretation:  Sinus tachycardia Left ventricular hypertrophy with  repolarization abnormality Abnormal ECG No significant change since last  tracing Confirmed by ALLEN  MD, Lorcan ([id number]) on 11/06/2014 8:06:29 PM      MDM   Final diagnoses:  Anxiety    Labs: CBC, CMP- significant for elevated creatinine at 1.86- normal for patient-elevated glucose 268 adjusted sodium 133  Imaging: None indicated  Consults: None indicated  Therapeutics: Ativan  Assessment: Anxiety  Plan: Patient's presentation likely represents anxiety. These events happen when he is thinking about stressful things life, he develops tachypnea, dizziness, numbness in the hands and fingers. These episodes are not associated with nausea vomiting, diaphoresis, chest pain, heart palpitations, or any other concerning ACS indicators. Patient remained stable throughout his stay with normal vital signs, no history of DVT PE, surgery, cancer. Patient was asymptomatic with the exception of anxiety today; he was treated with Ativan here with significant symptomatic improvement of his baseline anxiety. Patient was given short course of anxiolytic medication for use of panic attacks at home. He was instructed to monitor for worsening signs or symptoms including chest pain, shortness of breath, diaphoresis, nausea vomiting, or any other concerning signs or symptoms; instructed to return immediately to the emergency room for further evaluation. Patient was instructed to follow-up with primary care provider for long-term management of anxiety. Patient  understood and agreed to the plan and had no further questions or concerns at the time of discharge.    Eyvonne Mechanic, PA-C 11/07/14 0153  Eyvonne Mechanic, PA-C 11/07/14 0153  Lorre Nick, MD 11/07/14 [phone]

## 2014-11-06 NOTE — ED Notes (Signed)
Patient standing up at the side of the bed to urinate.  Steady and no c/o dizziness

## 2014-11-06 NOTE — ED Notes (Signed)
Discharge instructions and prescription reviewed, voiced understanding. 

## 2014-11-06 NOTE — Telephone Encounter (Signed)
Emergency Line Call  Patient reports one week of light headedness and then nausea since Monday. Notes hands feel cold and numb. Feels weak in his legs. BP was 175/107 at last check. No vomiting or fevers. Notes he had a stroke back in April. States jaw feels numb as well. Given his history and constellation of symptoms I advised the patient to go to the ED for evaluation. Stated he was on his way to the ED already and his wife was driving him.   Marikay AlarEric Sonnenberg, MD

## 2014-11-06 NOTE — ED Notes (Signed)
Patient states he was feeling some numbness in his hands with some dizziness when he would walk.  Also stated he was think like he may have been anxious and his breathing was fast.

## 2014-11-06 NOTE — ED Notes (Signed)
Pt c/o blurred vision x 3 weeks, numbness to hands bil x 2 weeks and dizziness (intermittent) x 2 weeks.  Pt did have sob en-route.

## 2014-11-11 ENCOUNTER — Ambulatory Visit (INDEPENDENT_AMBULATORY_CARE_PROVIDER_SITE_OTHER): Payer: Self-pay | Admitting: Family Medicine

## 2014-11-11 ENCOUNTER — Encounter: Payer: Self-pay | Admitting: Family Medicine

## 2014-11-11 VITALS — BP 157/94 | HR 117 | Ht 75.0 in | Wt 280.0 lb

## 2014-11-11 DIAGNOSIS — F329 Major depressive disorder, single episode, unspecified: Secondary | ICD-10-CM

## 2014-11-11 DIAGNOSIS — F32A Depression, unspecified: Secondary | ICD-10-CM | POA: Insufficient documentation

## 2014-11-11 DIAGNOSIS — E119 Type 2 diabetes mellitus without complications: Secondary | ICD-10-CM

## 2014-11-11 DIAGNOSIS — I10 Essential (primary) hypertension: Secondary | ICD-10-CM

## 2014-11-11 LAB — POCT GLYCOSYLATED HEMOGLOBIN (HGB A1C): Hemoglobin A1C: 6.5

## 2014-11-11 MED ORDER — CARVEDILOL 6.25 MG PO TABS: 6.2500 mg | ORAL_TABLET | Freq: Two times a day (BID) | ORAL | Status: DC

## 2014-11-11 MED ORDER — CITALOPRAM HYDROBROMIDE 20 MG PO TABS: 20.0000 mg | ORAL_TABLET | Freq: Every day | ORAL | Status: DC

## 2014-11-11 NOTE — Progress Notes (Signed)
Subjective:   Chris Vaughn is an 59 y.o. male who presents for evaluation and treatment of depressive symptoms/anxiety.  Onset approximately 2  years ago, gradually worsening since that time.  Current symptoms include depressed mood, anhedonia, insomnia, psychomotor retardation, fatigue, feelings of worthlessness/guilt, impaired memory, anxiety, panic attacks,.  Current treatment for depression:None Sleep problems: Mild   Early awakening:Mild   Energy: Fair Motivation: Fair Concentration: Fair Rumination/worrying: Severe Memory: Good Tearfulness: Mild  Anxiety: Severe  Panic: Marked  Overall Mood: No change  Hopelessness: Moderate Suicidal ideation: Absent  Other/Psychosocial Stressors: Son with financial problems  Family history positive for depression in the patient's unknown.  Previous treatment modalities employed include Medication.  Past episodes of depression:1 time  Organic causes of depression present: None.   HTN - Pt also evaluated for HTN.  Compliant with medications today and denies SE including dizziness, blurred vision, edema.  Denies CP, SOB at this time.  Not well controlled currently.  Review of Systems Pertinent items are noted in HPI.   Objective:     Filed Vitals:   11/11/14 1618  BP: 157/94  Pulse: 117   Repeat BP L arm sitting - 144/96  Mental Status Examination: Posture and motor behavior: Appropriate Dress, grooming, personal hygiene: Appropriate Facial expression: Appropriate Speech: Appropriate Mood: Appropriate Coherency and relevance of thought: Appropriate Thought content: Appropriate Perceptions: Appropriate Orientation:Appropriate Attention and concentration: Appropriate Memory: : Appropriate Information: Not examined Vocabulary: Appropriate Abstract reasoning: Appropriate Judgment: Appropriate

## 2014-11-11 NOTE — Assessment & Plan Note (Signed)
BP continues to be elevated  - On full dose Lotensin, HCTZ, Norvasc - Creatinine between 1.6-1.8 with CKD stage III  - Will avoid Aldactone at this time due to above but will start on Coreg for alpha blocking components @ this time.  Once titrated up, if no improvement, will consider sending to Dr. Raymondo BandKoval for Ambulatory BP monitoring. - F/U in 3-4 weeks, would consider repeat BMET at that time

## 2014-11-11 NOTE — Assessment & Plan Note (Signed)
GAD of 18 and PHQ 9 of 14 w number 9 (SI/HI) negative - Start on Celexa 20 mg.  F/U in 2-3 weeks and consider titration if tolerates - Recommend psychologist in area to work on mood disorder - No SI/HI today

## 2014-11-11 NOTE — Patient Instructions (Signed)
Start Celexa 20 mg daily. Start Coreg 6.25 mg twice daily  Taking the medicine as directed and not missing any doses is one of the best things you can do to treat your depression.  Here are some things to keep in mind:  1) Side effects (stomach upset, some increased anxiety) may happen before you notice a benefit.  These side effects typically go away over time. 2) Changes to your dose of medicine or a change in medication all together is sometimes necessary 3) Most people need to be on medication at least 6-12 months 4) Many people will notice an improvement within two weeks but the full effect of the medication can take up to 4-6 weeks 5) Stopping the medication when you start feeling better often results in a return of symptoms 6) If you start having thoughts of hurting yourself or others after starting this medicine, please call me at [phone] immediately.

## 2014-11-13 ENCOUNTER — Telehealth: Payer: Self-pay | Admitting: Family Medicine

## 2014-11-13 NOTE — Telephone Encounter (Signed)
Spoke with pt to see what kind of issues he was having with the medication.  He stated that he has stopped taking the celexa and the new BP medicine that was prescribed to him.  He stated that he was dizzy, and had some nausea.  Stated that he it made him hot and it was uncomfortable.  He stated that is was overpowering.  York SpanielSaid he tried them yesterday and didn't like the way it made him feel.  Told him i would send this information to the Dr. Lamonte SakaiZimmerman Rumple, April D

## 2014-11-13 NOTE — Telephone Encounter (Signed)
Pt called and said that his new prescription of Celexa is just to strong and he can not take this. jw

## 2014-11-13 NOTE — Telephone Encounter (Signed)
Please tell him to half the Celexa to 10 mg.  He should only have symptoms for about 1-2 weeks so as long as this is tolerable, it should dissipate.  If still having problems at 10, decrease to 5mg .  Thanks Tesoro CorporationBryan R. Paulina FusiHess, DO [facility] 11/13/2014, 3:53 PM

## 2014-11-14 NOTE — Telephone Encounter (Signed)
LVM for pt to return call to discuss below. Lamonte SakaiZimmerman Rumple, Cloyd Ragas D

## 2014-11-18 NOTE — Telephone Encounter (Signed)
Spoke with pt and gave him the information and he stated that [facility] and [facility] him Sertraline HCL (Generic Zoloft) 50 mg once daily.  He just wanted to let the dr know and I told him I would forward to dr and if dr had any questions he would contact him.  Otherwise he could talk more about this at his next appt with the dr. Lamonte SakaiZimmerman Rumple, April D

## 2014-12-01 ENCOUNTER — Ambulatory Visit: Payer: Self-pay | Admitting: Family Medicine

## 2014-12-03 ENCOUNTER — Ambulatory Visit: Payer: Self-pay | Admitting: Family Medicine

## 2014-12-10 ENCOUNTER — Other Ambulatory Visit: Payer: Self-pay | Admitting: Family Medicine

## 2014-12-15 ENCOUNTER — Encounter: Payer: Self-pay | Admitting: Family Medicine

## 2014-12-15 ENCOUNTER — Ambulatory Visit (INDEPENDENT_AMBULATORY_CARE_PROVIDER_SITE_OTHER): Payer: Self-pay | Admitting: Family Medicine

## 2014-12-15 VITALS — BP 138/92 | HR 89 | Temp 97.8°F | Wt 280.5 lb

## 2014-12-15 DIAGNOSIS — I10 Essential (primary) hypertension: Secondary | ICD-10-CM

## 2014-12-15 DIAGNOSIS — F32A Depression, unspecified: Secondary | ICD-10-CM

## 2014-12-15 DIAGNOSIS — F329 Major depressive disorder, single episode, unspecified: Secondary | ICD-10-CM

## 2014-12-15 LAB — BASIC METABOLIC PANEL
BUN: 23 mg/dL (ref 6–23)
CO2: 29 meq/L (ref 19–32)
Calcium: 9.5 mg/dL (ref 8.4–10.5)
Chloride: 99 mEq/L (ref 96–112)
Creat: 1.77 mg/dL — ABNORMAL HIGH (ref 0.50–1.35)
Glucose, Bld: 84 mg/dL (ref 70–99)
Potassium: 3.9 mEq/L (ref 3.5–5.3)
Sodium: 137 mEq/L (ref 135–145)

## 2014-12-15 MED ORDER — CARVEDILOL 12.5 MG PO TABS: 12.5000 mg | ORAL_TABLET | Freq: Two times a day (BID) | ORAL | Status: DC

## 2014-12-15 NOTE — Assessment & Plan Note (Addendum)
BP continues to be elevated  - On full dose Lotensin, HCTZ, Norvasc - Creatinine between 1.6-1.8 with CKD stage III  - Started on Coreg 6.25 mg BID, increase to 12.5 mg BID today.  Will avoid Aldactone at this time due to above.  If no improvement on Coreg full dose, will consider sending to Dr. Raymondo BandKoval for Ambulatory BP monitoring. - F/U in 4 weeks.   - BMET today to evaluate renal fxn

## 2014-12-15 NOTE — Assessment & Plan Note (Signed)
GAD of 18 and PHQ 9 of 14 w number 9 (SI/HI) negative from 11/11/14  - Doing well on Celexa 20 mg, denies GI SE or other SE. - Started on Remeron 7.5 mg qhs by Psychology, tolerating well  - Following with psychology q 2 weeks - No SI/HI today  - F/U in one month

## 2014-12-15 NOTE — Progress Notes (Signed)
Subjective:   Chris Vaughn is an 59 y.o. male who presents for evaluation and treatment of depressive symptoms/anxiety as well as HTN.   Depression:  Onset approximately 2  years ago, gradually worsening since that time. Pt was seen beginning of May 2016 at which time he was having depressed mood, anhedonia, fatigue, decreased sleep and energy.  Denies SI/HI.  He was started on Celexa 20 mg at that time.  Since that visit, he is feeling slightly improved but still having some anxiety Sx. [facility], where he follows every two weeks.  Started on Remeron at the first visit every night, which he has been tolerating.    HTN - Compliant with medications today and denies SE including dizziness, blurred vision, edema.  Denies CP, SOB at this time.  Has not been well controlled in past despite being on full dose Norvasc, HCTZ, and Lotensin.  Also on Coreg which he is tolerating well.   Review of Systems Pertinent items are noted in HPI.   Objective:     Filed Vitals:   12/15/14 1012  BP: 146/100  Pulse: 89  Temp: 97.8 F (36.6 C)    Mental Status Examination: Posture and motor behavior: Appropriate Gen: NAD Cardio: RRR, no murmurs appreciated

## 2014-12-15 NOTE — Patient Instructions (Signed)
Taking the medicine as directed and not missing any doses is one of the best things you can do to treat your depression.  Here are some things to keep in mind:  1) Side effects (stomach upset, some increased anxiety) may happen before you notice a benefit.  These side effects typically go away over time. 2) Changes to your dose of medicine or a change in medication all together is sometimes necessary 3) Most people need to be on medication at least 6-12 months 4) Many people will notice an improvement within two weeks but the full effect of the medication can take up to 4-6 weeks 5) Stopping the medication when you start feeling better often results in a return of symptoms 6) If you start having thoughts of hurting yourself or others after starting this medicine, please call me at [phone] immediately.     Please increase your Coreg to 12.5 mg two times per day.  Thanks, Dr. Paulina FusiHess

## 2015-02-04 ENCOUNTER — Other Ambulatory Visit: Payer: Self-pay | Admitting: Family Medicine

## 2015-02-04 NOTE — Telephone Encounter (Signed)
Ok to refill 

## 2015-04-06 ENCOUNTER — Other Ambulatory Visit: Payer: Self-pay | Admitting: Family Medicine

## 2015-04-06 NOTE — Telephone Encounter (Signed)
Did you want to refill or send to [facility]? 

## 2015-04-21 ENCOUNTER — Encounter (HOSPITAL_COMMUNITY): Payer: Self-pay | Admitting: Emergency Medicine

## 2015-04-21 ENCOUNTER — Emergency Department (HOSPITAL_COMMUNITY): Payer: Medicare Other

## 2015-04-21 DIAGNOSIS — Z7982 Long term (current) use of aspirin: Secondary | ICD-10-CM | POA: Insufficient documentation

## 2015-04-21 DIAGNOSIS — Z79899 Other long term (current) drug therapy: Secondary | ICD-10-CM | POA: Insufficient documentation

## 2015-04-21 DIAGNOSIS — Z8673 Personal history of transient ischemic attack (TIA), and cerebral infarction without residual deficits: Secondary | ICD-10-CM | POA: Insufficient documentation

## 2015-04-21 DIAGNOSIS — E119 Type 2 diabetes mellitus without complications: Secondary | ICD-10-CM | POA: Insufficient documentation

## 2015-04-21 DIAGNOSIS — E785 Hyperlipidemia, unspecified: Secondary | ICD-10-CM | POA: Insufficient documentation

## 2015-04-21 DIAGNOSIS — F329 Major depressive disorder, single episode, unspecified: Secondary | ICD-10-CM | POA: Diagnosis not present

## 2015-04-21 DIAGNOSIS — R079 Chest pain, unspecified: Secondary | ICD-10-CM | POA: Diagnosis not present

## 2015-04-21 DIAGNOSIS — I209 Angina pectoris, unspecified: Secondary | ICD-10-CM | POA: Diagnosis not present

## 2015-04-21 DIAGNOSIS — I10 Essential (primary) hypertension: Secondary | ICD-10-CM | POA: Insufficient documentation

## 2015-04-21 LAB — BASIC METABOLIC PANEL
Anion gap: 11 (ref 5–15)
BUN: 25 mg/dL — AB (ref 6–20)
CHLORIDE: 94 mmol/L — AB (ref 101–111)
CO2: 26 mmol/L (ref 22–32)
Calcium: 9 mg/dL (ref 8.9–10.3)
Creatinine, Ser: 1.98 mg/dL — ABNORMAL HIGH (ref 0.61–1.24)
GFR calc Af Amer: 40 mL/min — ABNORMAL LOW (ref >60)
GFR calc non Af Amer: 35 mL/min — ABNORMAL LOW (ref >60)
GLUCOSE: 151 mg/dL — AB (ref 65–99)
POTASSIUM: 3.4 mmol/L — AB (ref 3.5–5.1)
Sodium: 131 mmol/L — ABNORMAL LOW (ref 135–145)

## 2015-04-21 LAB — CBC
HEMATOCRIT: 42.4 % (ref 39.0–52.0)
Hemoglobin: 14.4 g/dL (ref 13.0–17.0)
MCH: 28.4 pg (ref 26.0–34.0)
MCHC: 34 g/dL (ref 30.0–36.0)
MCV: 83.6 fL (ref 78.0–100.0)
Platelets: 243 10*3/uL (ref 150–400)
RBC: 5.07 MIL/uL (ref 4.22–5.81)
RDW: 14 % (ref 11.5–15.5)
WBC: 5.4 10*3/uL (ref 4.0–10.5)

## 2015-04-21 LAB — I-STAT TROPONIN, ED: Troponin i, poc: 0 ng/mL (ref 0.00–0.08)

## 2015-04-21 NOTE — ED Notes (Signed)
The patient said he was having chest pain and throat pain for about two days.  The patient said he has had a stroke in the past so he did not want to let it go. He says he has had diarrhea for two days as well.  He rates his pain 4/10.  The patient has only taken his daily meds but nothing for the chest pain.

## 2015-04-22 ENCOUNTER — Observation Stay (HOSPITAL_COMMUNITY)
Admission: EM | Admit: 2015-04-22 | Discharge: 2015-04-23 | Disposition: A | Discharge status: Home / Self Care | Payer: Medicare Other | Attending: Family Medicine | Admitting: Family Medicine

## 2015-04-22 ENCOUNTER — Encounter (HOSPITAL_COMMUNITY): Payer: Self-pay | Admitting: General Practice

## 2015-04-22 DIAGNOSIS — I209 Angina pectoris, unspecified: Secondary | ICD-10-CM | POA: Diagnosis present

## 2015-04-22 DIAGNOSIS — I10 Essential (primary) hypertension: Secondary | ICD-10-CM | POA: Diagnosis not present

## 2015-04-22 DIAGNOSIS — R072 Precordial pain: Secondary | ICD-10-CM

## 2015-04-22 DIAGNOSIS — K219 Gastro-esophageal reflux disease without esophagitis: Secondary | ICD-10-CM | POA: Diagnosis not present

## 2015-04-22 DIAGNOSIS — E119 Type 2 diabetes mellitus without complications: Secondary | ICD-10-CM | POA: Diagnosis not present

## 2015-04-22 DIAGNOSIS — R079 Chest pain, unspecified: Secondary | ICD-10-CM | POA: Diagnosis present

## 2015-04-22 HISTORY — DX: Hyperlipidemia, unspecified: E78.5

## 2015-04-22 LAB — CBC
HCT: 42 % (ref 39.0–52.0)
HEMOGLOBIN: 14.3 g/dL (ref 13.0–17.0)
MCH: 28.3 pg (ref 26.0–34.0)
MCHC: 34 g/dL (ref 30.0–36.0)
MCV: 83 fL (ref 78.0–100.0)
PLATELETS: 252 10*3/uL (ref 150–400)
RBC: 5.06 MIL/uL (ref 4.22–5.81)
RDW: 13.9 % (ref 11.5–15.5)
WBC: 5.2 10*3/uL (ref 4.0–10.5)

## 2015-04-22 LAB — GLUCOSE, CAPILLARY
Glucose-Capillary: 140 mg/dL — ABNORMAL HIGH (ref 65–99)
Glucose-Capillary: 161 mg/dL — ABNORMAL HIGH (ref 65–99)

## 2015-04-22 LAB — LIPID PANEL
CHOL/HDL RATIO: 3.3 ratio
Cholesterol: 140 mg/dL (ref 0–200)
HDL: 43 mg/dL (ref >40)
LDL CALC: 64 mg/dL (ref 0–99)
Triglycerides: 165 mg/dL — ABNORMAL HIGH (ref <150)
VLDL: 33 mg/dL (ref 0–40)

## 2015-04-22 LAB — TROPONIN I
Troponin I: <0.03 ng/mL (ref <0.031)
Troponin I: <0.03 ng/mL (ref <0.031)
Troponin I: <0.03 ng/mL (ref <0.031)

## 2015-04-22 LAB — CREATININE, SERUM
CREATININE: 1.83 mg/dL — AB (ref 0.61–1.24)
GFR calc non Af Amer: 38 mL/min — ABNORMAL LOW (ref >60)
GFR, EST AFRICAN AMERICAN: 44 mL/min — AB (ref >60)

## 2015-04-22 LAB — CBG MONITORING, ED
GLUCOSE-CAPILLARY: 129 mg/dL — AB (ref 65–99)
GLUCOSE-CAPILLARY: 132 mg/dL — AB (ref 65–99)

## 2015-04-22 LAB — TSH: TSH: 2.314 u[IU]/mL (ref 0.350–4.500)

## 2015-04-22 MED ORDER — ATORVASTATIN CALCIUM 40 MG PO TABS
40.0000 mg | ORAL_TABLET | Freq: Every day | ORAL | Status: DC
  Administered 2015-04-22: 40 mg via ORAL
  Filled 2015-04-22 (×2): qty 1

## 2015-04-22 MED ORDER — HYDROCHLOROTHIAZIDE 25 MG PO TABS
25.0000 mg | ORAL_TABLET | Freq: Every day | ORAL | Status: DC
  Administered 2015-04-22 – 2015-04-23 (×2): 25 mg via ORAL
  Filled 2015-04-22 (×2): qty 1

## 2015-04-22 MED ORDER — BENAZEPRIL HCL 40 MG PO TABS: 40.0000 mg | ORAL_TABLET | Freq: Every day | ORAL | Status: DC

## 2015-04-22 MED ORDER — ONDANSETRON HCL 4 MG/2ML IJ SOLN: 4.0000 mg | Freq: Four times a day (QID) | INTRAMUSCULAR | Status: DC | PRN

## 2015-04-22 MED ORDER — AMLODIPINE BESYLATE 10 MG PO TABS
10.0000 mg | ORAL_TABLET | Freq: Every day | ORAL | Status: DC
  Administered 2015-04-22 – 2015-04-23 (×2): 10 mg via ORAL
  Filled 2015-04-22: qty 1
  Filled 2015-04-22: qty 2

## 2015-04-22 MED ORDER — INSULIN ASPART 100 UNIT/ML ~~LOC~~ SOLN
0.0000 [IU] | Freq: Three times a day (TID) | SUBCUTANEOUS | Status: DC
  Administered 2015-04-22 (×2): 1 [IU] via SUBCUTANEOUS
  Administered 2015-04-23: 2 [IU] via SUBCUTANEOUS
  Filled 2015-04-22: qty 1

## 2015-04-22 MED ORDER — ASPIRIN EC 81 MG PO TBEC
81.0000 mg | DELAYED_RELEASE_TABLET | Freq: Every day | ORAL | Status: DC
  Administered 2015-04-22 – 2015-04-23 (×2): 81 mg via ORAL
  Filled 2015-04-22 (×2): qty 1

## 2015-04-22 MED ORDER — CARVEDILOL 12.5 MG PO TABS
12.5000 mg | ORAL_TABLET | Freq: Two times a day (BID) | ORAL | Status: DC
Start: 2015-04-22 — End: 2015-04-23
  Administered 2015-04-22 – 2015-04-23 (×3): 12.5 mg via ORAL
  Filled 2015-04-22 (×5): qty 1

## 2015-04-22 MED ORDER — CITALOPRAM HYDROBROMIDE 20 MG PO TABS
20.0000 mg | ORAL_TABLET | Freq: Every day | ORAL | Status: DC
  Administered 2015-04-22 – 2015-04-23 (×2): 20 mg via ORAL
  Filled 2015-04-22: qty 2
  Filled 2015-04-22: qty 1

## 2015-04-22 MED ORDER — ACETAMINOPHEN 325 MG PO TABS: 650.0000 mg | ORAL_TABLET | ORAL | Status: DC | PRN

## 2015-04-22 MED ORDER — MIRTAZAPINE 7.5 MG PO TABS
7.5000 mg | ORAL_TABLET | Freq: Every day | ORAL | Status: DC
  Administered 2015-04-22: 7.5 mg via ORAL
  Filled 2015-04-22 (×2): qty 1

## 2015-04-22 MED ORDER — INFLUENZA VAC SPLIT QUAD 0.5 ML IM SUSY
0.5000 mL | PREFILLED_SYRINGE | INTRAMUSCULAR | Status: AC
  Administered 2015-04-23: 0.5 mL via INTRAMUSCULAR
  Filled 2015-04-22: qty 0.5

## 2015-04-22 MED ORDER — ENOXAPARIN SODIUM 40 MG/0.4ML ~~LOC~~ SOLN
40.0000 mg | SUBCUTANEOUS | Status: DC
  Administered 2015-04-22 – 2015-04-23 (×2): 40 mg via SUBCUTANEOUS
  Filled 2015-04-22 (×2): qty 0.4

## 2015-04-22 MED ORDER — INSULIN ASPART 100 UNIT/ML ~~LOC~~ SOLN: 0.0000 [IU] | Freq: Every day | SUBCUTANEOUS | Status: DC

## 2015-04-22 MED ORDER — BENAZEPRIL HCL 40 MG PO TABS
40.0000 mg | ORAL_TABLET | Freq: Every day | ORAL | Status: DC
  Administered 2015-04-22 – 2015-04-23 (×2): 40 mg via ORAL
  Filled 2015-04-22 (×2): qty 1

## 2015-04-22 NOTE — ED Notes (Signed)
Breaksfast Tray ordered @ 0507-Carb Modified Diet.

## 2015-04-22 NOTE — ED Notes (Signed)
EKG reviewed by Dr. Rhunette CroftNanavati .

## 2015-04-22 NOTE — H&P (Signed)
Family Medicine Teaching [facility] Admission History and Physical Service Pager: [phone]  Patient name: Chris Vaughn Medical record number: [id number] Date of birth: 08-06-1955 Age: 59 y.o. Gender: male  Primary Care Provider: Delynn Flavin, DO Consultants: None Code Status: Full  Chief Complaint:  Elevated blood pressure and chest discomfort  Assessment and Plan: Chris Vaughn is a 59 y.o. male presenting with chest discomfort and blood pressure elevation to 160/113 at home. PMH is significant for H/o CVA, CKD, HLD, HTN, DM2, deppresion  Chest pain- Heart Score 3, history not consistent with ACS, but given risk factors and different stories obtained, concern about his true symptoms pattern. Troponin neg x1, EKG wnl. Possible GERD as true mechanism behind chest discomfort given history of pain relief with belching - admit to telemetry - trend troponins - EKG in AM - GI cocktail - consider cards ion AM if abnormal  DM2- last A1c 6.5 11/2014 - SSI - ACHS CBGs  HLD - continue home lipitor  HTN- BP mildy elevated to 141/98 in ED - continue home coreg 12.5- refill on discharge - continue home benezapril  CKD. Cr 1.98, baseline appears to be 1.8-1.9 - F/u AM Cr  Depression - continue home celexa, remeron,   FEN/GI: Heart/carb mod diet, SLIV Prophylaxis: Lovenox  Disposition: Admit to telemetry, Dr Pollie Meyer attending  History of Present Illness:  Chris Vaughn is a 59 y.o. male presenting after having an episode of central chest pain this afternoon at approximately 12:30 pm. He was loading dishes into the dishwasher. The pain was sharp in nature rated at a 1-4/10. He had some dizziness associated, denies radiation of the pain, SOB, diaphoresis, headache. He feels this pain is similar to his baseline GERD symptoms that are relieved after belching. He belched after this episode began and the pain began to improve. His pain then resolved but again returned  after arriving to the ED, but much milder this time.  He states that he originally came to the ED because his blood presure at home read 160/113. He initially was going to wait and recheck but was very concerned so he came to the ED. He ran out of his home 12.5 mg carvedilol one week ago and has been taking his old 6.25 mg dose. He was anxious that his blood pressure was high enough to cause a stroke Otherwise denies abd pain, fever/chills, diarrhea, cough  After speaking to the ED attending regarding his admission for chest pain and the history he gave about its similarity to GERD, we found that there was discrepancy between what the ED provider was told and what the Family medicine provider was told. As the patient expressed some surprise that he needed to stay overnight there was concern that he amended his story in order to be discharged. It was discussed with the patient that given his ris factors for ACS, observation overnight would be recommended. He was in agreement with this  Review Of Systems: Per HPI .  Patient Active Problem List   Diagnosis Date Noted  . Angina pectoris (HCC) 04/22/2015  . Mood disorder of depressed type 11/11/2014  . Rectal bleeding 04/01/2014  . Adjustment disorder with mixed anxiety and depressed mood 11/28/2013  . Hyperlipidemia 11/13/2013  . CKD (chronic kidney disease) stage 3, GFR 30-59 ml/min 11/13/2013  . CVA (cerebral infarction) 10/16/2013  . Mild depression 06/27/2012  . HTN, goal below 130/80 06/27/2012  . DIABETES MELLITUS 08/13/2010   Past Medical History: Past Medical History  Diagnosis Date  . Hypertension   . Depression   . Diabetes mellitus without complication (HCC)   . Stroke [facility](HCC)    Past Surgical History: Past Surgical History  Procedure Laterality Date  . Back surgery     Social History: Social History  Substance Use Topics  . Smoking status: Never Smoker   . Smokeless tobacco: None  . Alcohol Use: No   Please also refer  to relevant sections of EMR.  Family History: Family History  Problem Relation Age of Onset  . Diabetes Mother   . Heart disease Mother   . Hypertension Father    Allergies and Medications: No Known Allergies No current facility-administered medications on file prior to encounter.   Current Outpatient Prescriptions on File Prior to Encounter  Medication Sig Dispense Refill  . amLODipine (NORVASC) 10 MG tablet TAKE ONE TABLET BY MOUTH ONCE DAILY. 90 tablet 3  . atorvastatin (LIPITOR) 40 MG tablet Take 1 tablet (40 mg total) by mouth daily at 6 PM. 30 tablet 11  . benazepril (LOTENSIN) 40 MG tablet TAKE ONE TABLET BY MOUTH ONCE DAILY 90 tablet 0  . carvedilol (COREG) 12.5 MG tablet Take 1 tablet (12.5 mg total) by mouth 2 (two) times daily with a meal. 60 tablet 1  . glimepiride (AMARYL) 2 MG tablet Take 1 tablet (2 mg total) by mouth daily with breakfast. 90 tablet 3  . hydrochlorothiazide (HYDRODIURIL) 25 MG tablet Take 1 tablet (25 mg total) by mouth daily. 90 tablet 3  . aspirin EC 81 MG EC tablet Take 1 tablet (81 mg total) by mouth daily. (Patient not taking: Reported on 11/06/2014) 30 tablet 0  . benazepril (LOTENSIN) 40 MG tablet TAKE ONE TABLET BY MOUTH ONCE DAILY (Patient not taking: Reported on 04/22/2015) 90 tablet 0  . citalopram (CELEXA) 20 MG tablet Take 1 tablet (20 mg total) by mouth daily. (Patient not taking: Reported on 04/22/2015) 30 tablet 3    Objective: BP 141/98 mmHg  Pulse 87  Temp(Src) 98.6 F (37 C) (Oral)  Resp 11  Ht 6\' 3"  (1.905 m)  Wt 279 lb (126.554 kg)  BMI 34.87 kg/m2  SpO2 96% Exam: General: Lying in bed, NAD HEENT: PERRL, NCAT, MMM Cardiovascular: RRR, urmurs Respiratory: CTAB, normal WOB Abdomen: obese, soft, non tender MSK: no sternal tenderness Skin: no rashes or lesions Extremities: no LE edema or tenderness Neuro: no focal deficits Psych: normal mood and affect  Labs and Imaging: CBC BMET   Recent Labs Lab 04/21/15 2122  WBC  5.4  HGB 14.4  HCT 42.4  PLT 243    Recent Labs Lab 04/21/15 2122  NA 131*  K 3.4*  CL 94*  CO2 26  BUN 25*  CREATININE 1.98*  GLUCOSE 151*  CALCIUM 9.0     CXR 10/11 IMPRESSION: No acute abnormality noted.   Bonney AidAlyssa A Dmarcus Decicco, MD 04/22/2015, 3:44 AM PGY-2, [facility] FPTS Intern pager: [phone], text pages welcome

## 2015-04-22 NOTE — ED Notes (Signed)
Attempted report to Ray RN unable to take report will call back in 

## 2015-04-22 NOTE — ED Notes (Signed)
Meal try was just delivered. Advised Rey RN that meal time insulin needs to be given

## 2015-04-22 NOTE — Consult Note (Signed)
CARDIOLOGY CONSULT NOTE   Patient ID: Chris Vaughn MRN: [id number] DOB/AGE: 1956/05/24 59 y.o.  Admit date: 04/22/2015  Primary Physician   Delynn Flavin, DO Primary Cardiologist   New (seen by Dr. Antoine Poche remotely) Reason for Consultation   Chest pain  HPI: Chris Vaughn is a 59 y.o. male with a history of HTN, DM, hx of CVA, HLD, CKD stage III, depression and no prior cardiac history who presented to [facility] ED last night with chest pain.   He has a history of uncontrolled HTN. His BP has not been well controlled in past despite being on full dose Norvasc 10mg , HCTZ 25mg , Lotensin 40mg  and Coreg 12.5mg  BID. He was in his usual state of health until last night after dinner when he had sudden onset of sharp central chest pain. He thinks it radiated to the jaw but there was no associated SOB, diaphoresis or nausea. It occurred while loading up the dishwasher. It lasted about 1.5 hours and then eased off on its own. He also had a little lightheadedness which he though was maybe due to anxiety. He feels this pain is similar to his baseline GERD symptoms that are relieved after belching. He belched after this episode began and the pain began to improve. His pain then resolved but again returned after arriving to the ED, but much milder this time.  He states that he originally came to the ED because his blood presure at home read 160/113. He initially was going to wait and recheck but was very concerned so he came to the ED. He ran out of his home 12.5 mg carvedilol one week ago and has been taking his old 6.25 mg dose. He was anxious that his blood pressure was high enough to cause a stroke.  He has been disabled since 2015 after his stroke. He is not very active. He does not exercise and his activity is limited to going outside to get the mail. He denies any exertional CP or SOB. Both his mother and grandmother had CAD with events in their 47s and 56s respectively, but both were  heavy smokers. He has no history of tobacco abuse.    Past Medical History  Diagnosis Date  . Hypertension   . Depression   . Diabetes mellitus without complication (HCC)   . Stroke (HCC)   . Hyperlipidemia      Past Surgical History  Procedure Laterality Date  . Back surgery      No Known Allergies  I have reviewed the patient's current medications . amLODipine  10 mg Oral Daily  . aspirin EC  81 mg Oral Daily  . atorvastatin  40 mg Oral q1800  . benazepril  40 mg Oral Daily  . carvedilol  12.5 mg Oral BID WC  . citalopram  20 mg Oral Daily  . enoxaparin (LOVENOX) injection  40 mg Subcutaneous Q24H  . hydrochlorothiazide  25 mg Oral Daily  . insulin aspart  0-5 Units Subcutaneous QHS  . insulin aspart  0-9 Units Subcutaneous TID WC  . mirtazapine  7.5 mg Oral QHS     acetaminophen, ondansetron (ZOFRAN) IV  Prior to Admission medications   Medication Sig Start Date End Date Taking? Authorizing Provider  amLODipine (NORVASC) 10 MG tablet TAKE ONE TABLET BY MOUTH ONCE DAILY. 12/10/14  Yes Bryan R Hess, DO  atorvastatin (LIPITOR) 40 MG tablet Take 1 tablet (40 mg total) by mouth daily at 6 PM. 11/13/13  Yes Judie Grieve  R Hess, DO  benazepril (LOTENSIN) 40 MG tablet TAKE ONE TABLET BY MOUTH ONCE DAILY 10/07/14  Yes Bryan R Hess, DO  carvedilol (COREG) 12.5 MG tablet Take 1 tablet (12.5 mg total) by mouth 2 (two) times daily with a meal. 12/15/14  Yes Bryan R Hess, DO  glimepiride (AMARYL) 2 MG tablet Take 1 tablet (2 mg total) by mouth daily with breakfast. 11/06/14  Yes Bryan R Hess, DO  hydrochlorothiazide (HYDRODIURIL) 25 MG tablet Take 1 tablet (25 mg total) by mouth daily. 11/06/14  Yes Bryan R Hess, DO  hydrOXYzine (ATARAX/VISTARIL) 25 MG tablet Take 25-50 mg by mouth See admin instructions. Take 1 tablet three time daily and take 2 tablets at bedtime   Yes Historical Provider, MD  mirtazapine (REMERON) 15 MG tablet Take 7.5 mg by mouth at bedtime.   Yes Historical Provider, MD    aspirin EC 81 MG EC tablet Take 1 tablet (81 mg total) by mouth daily. Patient not taking: Reported on 11/06/2014 10/17/13   Narda Bonds, MD  citalopram (CELEXA) 20 MG tablet Take 1 tablet (20 mg total) by mouth daily. Patient not taking: Reported on 04/22/2015 11/11/14   Briscoe Deutscher, DO     Social History   Social History  . Marital Status: Married    Spouse Name: N/A  . Number of Children: N/A  . Years of Education: N/A   Occupational History  . Not on file.   Social History Main Topics  . Smoking status: Never Smoker   . Smokeless tobacco: Never Used  . Alcohol Use: No  . Drug Use: No  . Sexual Activity: Not on file   Other Topics Concern  . Not on file   Social History Narrative    No family status information on file.   Family History  Problem Relation Age of Onset  . Diabetes Mother   . Heart disease Mother   . Hypertension Father      ROS:  Full 14 point review of systems complete and found to be negative unless listed above.  Physical Exam: Blood pressure 166/98, pulse 86, temperature 98.7 F (37.1 C), temperature source Oral, resp. rate 18, height  (1.905 m), weight 279 lb 11.2 oz (126.871 kg), SpO2 96 %.  General: Well developed, well nourished, male in no acute distress Head: Eyes PERRLA, No xanthomas.   Normocephalic and atraumatic, oropharynx without edema or exudate.  Lungs: CTAB Heart: HRRR S1 S2, no rub/gallop, Heart reg rate and rhythm with S1, S2  murmur. pulses are 2+ extrem.   Neck: No carotid bruits. No lymphadenopathy. No JVD. Abdomen: Bowel sounds present, abdomen soft and non-tender without masses or hernias noted. Msk:  No spine or cva tenderness. No weakness, no joint deformities or effusions. Extremities: No clubbing or cyanosis. No edema.  Neuro: Alert and oriented X 3. No focal deficits noted. Psych:  Good affect, responds appropriately Skin: No rashes or lesions noted.  Labs:   Lab Results  Component Value Date   WBC 5.2  04/22/2015   HGB 14.3 04/22/2015   HCT 42.0 04/22/2015   MCV 83.0 04/22/2015   PLT 252 04/22/2015   No results for input(s): INR in the last 72 hours.  Recent Labs Lab 04/21/15 2122 04/22/15 0451  NA 131*  --   K 3.4*  --   CL 94*  --   CO2 26  --   BUN 25*  --   CREATININE 1.98* 1.83*  CALCIUM 9.0  --  GLUCOSE 151*  --    MAGNESIUM  Date Value Ref Range Status  02/19/2014 2.0 1.5 - 2.5 mg/dL Final    Recent Labs  16/10/96 0451 04/22/15 0750  TROPONINI <0.03 <0.03    Recent Labs  04/21/15 2127  TROPIPOC 0.00   No results found for: PROBNP Lab Results  Component Value Date   CHOL 204* 10/17/2013   HDL 45 10/17/2013   LDLCALC 116* 10/17/2013   TRIG 217* 10/17/2013   No results found for: DDIMER LIPASE  Date/Time Value Ref Range Status  09/24/2014 08:40 PM 40 11 - 59 U/L Final   TSH  Date/Time Value Ref Range Status  10/17/2013 01:40 AM 0.167* 0.350 - 4.500 uIU/mL Final    Comment:    Please note change in reference range.   T3, FREE  Date/Time Value Ref Range Status  10/17/2013 08:45 AM 3.1 2.3 - 4.2 pg/mL Final    Comment:    Performed at [facility]     Echo: Study Date: 10/17/2013 LV EF: 60% -  65% Study Conclusions - Left ventricle: The cavity size was normal. Wall thickness was increased in a pattern of moderate LVH. Systolic function was normal. The estimated ejection fraction was in the range of 60% to 65%. Wall motion was normal; there were no regional wall motion abnormalities. - Left atrium: The atrium was mildly dilated. - Atrial septum: No defect or patent foramen ovale was identified.  ECG:  HR 100 Sinus tachycardia Abnormal R-wave progression, early transition LVH  Radiology:  Dg Chest 2 View  04/21/2015  CLINICAL DATA:  Central chest pain EXAM: CHEST - 2 VIEW COMPARISON:  09/24/14 FINDINGS: Cardiac shadow is within normal limits. The lungs are well aerated bilaterally. Multilevel degenerative changes  of the thoracic spine are again seen. IMPRESSION: No acute abnormality noted. Electronically Signed   By: Alcide Clever M.D.   On: 04/21/2015 21:31    ASSESSMENT AND PLAN:    Active Problems:   Angina pectoris [facility])   Chest pain   Chris Vaughn is a 59 y.o. male with a history of HTN, DM, hx of CVA, HLD, CKD stage III, depression and no prior cardiac history who presented to [facility] ED last night with chest pain.   Chest pain- atypical but he does have significant RF including DM, uncontrolled HTN, HLD. He is currently chest pain free.  -- Troponin neg x2. ECG with no acute ST or TW change. -- With RFs may want to do nuclear stress test, but this could be arranged as an outpatient.  -- Continue ASA, BB and statin for now.  Uncontrolled HTN- His BP has not been well controlled in past despite being on full dose Norvasc , HCTZ , Lotensin  and Coreg 12.5mg  BID. He is on over 3 different agents including a diuretic and his HTN not well controlled. It may be time to think about secondary causes of HTN such as renal artery stenosis  DM2- last A1c 6.5 11/2014  HLD- continue home lipitor  CKD- Cr 1.98, baseline appears to be 1.8-1.9  Depression- continue home celexa, remeron,   Signed: Janetta Hora, PA-C 04/22/2015 2:02 PM  Pager [phone]  Co-Sign MD  History and all data above reviewed.  Patient examined.  I agree with the findings as above.  He had very atypical chest pain.  No objective evidence of ischemia.  The patient exam reveals COR:RRR  ,  Lungs: Clear  ,  Abd: Positive bowel sounds, no  rebound no guarding, Ext No edema  .  All available labs, radiology testing, previous records reviewed. Agree with documented assessment and plan. CHEST PAIN:  Atypical. He could have an outpatient POET (Plain Old Exercise Treadmill) once his BP is controlled.  HTN:  BP is elevated.  He needs another sleep study.  He has had sleep apnea documented years ago but has not had  treatment.  This could certainly contribute to difficult to control HTN.  Also he needs TLC (Therapeutic Lifestyle Changes) to include weight loss and exercise.    Fayrene FearingJames Hershell Brandl  4:31 PM  04/22/2015

## 2015-04-23 DIAGNOSIS — I209 Angina pectoris, unspecified: Secondary | ICD-10-CM

## 2015-04-23 DIAGNOSIS — R072 Precordial pain: Secondary | ICD-10-CM | POA: Diagnosis not present

## 2015-04-23 DIAGNOSIS — I10 Essential (primary) hypertension: Secondary | ICD-10-CM | POA: Diagnosis not present

## 2015-04-23 DIAGNOSIS — E119 Type 2 diabetes mellitus without complications: Secondary | ICD-10-CM | POA: Diagnosis not present

## 2015-04-23 LAB — GLUCOSE, CAPILLARY
GLUCOSE-CAPILLARY: 152 mg/dL — AB (ref 65–99)
GLUCOSE-CAPILLARY: 117 mg/dL — AB (ref 65–99)

## 2015-04-23 LAB — HEMOGLOBIN A1C
Hgb A1c MFr Bld: 7.5 % — ABNORMAL HIGH (ref 4.8–5.6)
Mean Plasma Glucose: 169 mg/dL

## 2015-04-23 LAB — TROPONIN I: Troponin I: <0.03 ng/mL (ref <0.031)

## 2015-04-23 MED ORDER — CARVEDILOL 12.5 MG PO TABS: 12.5000 mg | ORAL_TABLET | Freq: Two times a day (BID) | ORAL | Status: DC

## 2015-04-23 MED ORDER — ATORVASTATIN CALCIUM 40 MG PO TABS: 40.0000 mg | ORAL_TABLET | Freq: Every day | ORAL | Status: DC

## 2015-04-23 NOTE — Discharge Instructions (Addendum)
It has been a pleasure taking care of you! You were admitted due to chest pain. After performing medical exams and tests it is less likely that your chest pain is from your heart. However, cardiology (heart doctors) will perform some tests as an outpatient in the future. We may have made some adjustment to your medication. Please read the directions very carefully before you take your medications. The names and directions on how to take are found on this discharge paper under medication section.  You also need a follow up with your PCP. The address, date and time are found on the discharge paper under follow up section.  Take care,   Follow-up Information    Follow up with Delynn FlavinAshly Gottschalk, DO On 04/27/2015.   Specialty:  Family Medicine   Why:  10:15 am   Contact information:   [address] [place] [address] [phone]

## 2015-04-23 NOTE — Discharge Summary (Signed)
Chris Vaughn Discharge Summary  Patient name: Chris Vaughn Medical record number: [id number] Date of birth: 06-10-1956 Age: 59 y.o. Gender: male Date of Admission: 04/22/2015  Date of Discharge: 04/23/2015  Admitting Physician: Leeanne Rio, MD  Primary Care Provider: Ronnie Doss, DO Consultants: Cardiology   Indication for Hospitalization: ACS rule out due to Chest Pain  Discharge Diagnoses/Problem List:  Patient Active Problem List   Diagnosis Date Noted  . Angina pectoris ([place]) 04/22/2015  . Chest pain 04/22/2015  . Esophageal reflux   . Essential hypertension, benign   . Type 2 diabetes mellitus without complication, without long-term current use of insulin ([place])   . Mood disorder of depressed type 11/11/2014  . Rectal bleeding 04/01/2014  . Adjustment disorder with mixed anxiety and depressed mood 11/28/2013  . Hyperlipidemia 11/13/2013  . CKD (chronic kidney disease) stage 3, GFR 30-59 ml/min 11/13/2013  . CVA (cerebral infarction) 10/16/2013  . Mild depression 06/27/2012  . HTN, goal below 130/80 06/27/2012  . DIABETES MELLITUS 08/13/2010    Disposition: Home  Discharge Condition: Stable  Discharge Exam:  Gen: NAD, well-appearing Nares: clear, no erythema, swelling or congestion Oropharynx: clear, moist CV: RRR. S1 & S2 audible, no murmurs Resp: no apparent WOB, CTAB. Abd: +BS. Soft, NDNT, no rebound or guarding.  Ext: No edema or gross deformities. Neuro: Alert and oriented, No gross focal deficits  Brief Vaughn Course:  Chris Vaughn is a 59 y.o. male with a history of HTN, DM, hx of CVA, HLD, CKD stage III, depression and no prior cardiac history who presented to [facility] ED with atypical chest pain.   Chest pain- resolved. atypical but he does have significant RF including DM, uncontrolled HTN, HLD. Troponin neg x5. ECG with no acute ST or TW change. Cardiology consulted and recommended nuclear stress test as  an outpatient.   Uncontrolled HTN- presented with elevated BP in the setting of not taking his carvedilol this time. His BP has not been well controlled in past despite being on full dose Norvasc 28m, HCTZ 264m Lotensin 4067mnd Coreg 12.5mg79mD. He is on over 3 different agents including a diuretic and his HTN not well controlled. It may be time to think about secondary causes of HTN such as renal artery stenosis as an outpatient.  DM-2: A1c 7.5 from 6.5 5 months ago. Seems not well-controlled. On glimepiride at home. May consider adding low dose metformin with close monitor of his renal function given his low eGFR, 44. The other option will be starting insulin. We will leave this for PCP to decide.  Other chronic conditions are stable.  Issues for Follow Up:  1. Patient to have an outpatient stress test, follow up on this  2. Uncontrolled hypertension: concern for resistant hypertension. May consider work up for this if continues to out of control on optimal meds. 3. DM-2: not well-controlled. recommend adding second medication. eGFR 44. 4. Hx of CKD. Cr 1.83. Recommend Bmet at follow up  Significant Procedures: None   Significant Labs and Imaging:   Recent Labs Lab 04/21/15 2122 04/22/15 0451  WBC 5.4 5.2  HGB 14.4 14.3  HCT 42.4 42.0  PLT 243 252    Recent Labs Lab 04/21/15 2122 04/22/15 0451  NA 131*  --   K 3.4*  --   CL 94*  --   CO2 26  --   GLUCOSE 151*  --   BUN 25*  --   CREATININE 1.98* 1.83*  CALCIUM 9.0  --       Recent Labs Lab 04/22/15 0451 04/22/15 0750 04/22/15 1342 04/22/15 2015 04/23/15 0140  TROPONINI <0.03 <0.03 <0.03 <0.03 <0.03     Results/Tests Pending at Time of Discharge: None   Discharge Medications:    Medication List    TAKE these medications        amLODipine 10 MG tablet  Commonly known as:  NORVASC  TAKE ONE TABLET BY MOUTH ONCE DAILY.     aspirin 81 MG EC tablet  Take 1 tablet (81 mg total) by mouth daily.      atorvastatin 40 MG tablet  Commonly known as:  LIPITOR  Take 1 tablet (40 mg total) by mouth daily at 6 PM.     benazepril 40 MG tablet  Commonly known as:  LOTENSIN  TAKE ONE TABLET BY MOUTH ONCE DAILY     carvedilol 12.5 MG tablet  Commonly known as:  COREG  Take 1 tablet (12.5 mg total) by mouth 2 (two) times daily with a meal.     citalopram 20 MG tablet  Commonly known as:  CELEXA  Take 1 tablet (20 mg total) by mouth daily.     glimepiride 2 MG tablet  Commonly known as:  AMARYL  Take 1 tablet (2 mg total) by mouth daily with breakfast.     hydrochlorothiazide 25 MG tablet  Commonly known as:  HYDRODIURIL  Take 1 tablet (25 mg total) by mouth daily.     hydrOXYzine 25 MG tablet  Commonly known as:  ATARAX/VISTARIL  Take 25-50 mg by mouth See admin instructions. Take 1 tablet three time daily and take 2 tablets at bedtime     mirtazapine 15 MG tablet  Commonly known as:  REMERON  Take 7.5 mg by mouth at bedtime.        Discharge Instructions: Please refer to Patient Instructions section of EMR for full details.  Patient was counseled important signs and symptoms that should prompt return to medical care, changes in medications, dietary instructions, activity restrictions, and follow up appointments.   Follow-Up Appointments:  Follow-up Information    Follow up with Ronnie Doss, DO On 04/27/2015.   Specialty:  Family Medicine   Why:  10:15 am   Contact information:   [address] [address] [phone]      Mercy Riding, MD 04/23/2015, 1:32 PM PGY-1 [place]

## 2015-04-27 ENCOUNTER — Encounter: Payer: Self-pay | Admitting: Family Medicine

## 2015-04-27 ENCOUNTER — Ambulatory Visit (INDEPENDENT_AMBULATORY_CARE_PROVIDER_SITE_OTHER): Payer: Medicare Other | Admitting: Family Medicine

## 2015-04-27 VITALS — BP 148/96 | HR 90 | Temp 98.6°F | Ht 75.0 in | Wt 277.5 lb

## 2015-04-27 DIAGNOSIS — I10 Essential (primary) hypertension: Secondary | ICD-10-CM | POA: Diagnosis not present

## 2015-04-27 DIAGNOSIS — E785 Hyperlipidemia, unspecified: Secondary | ICD-10-CM | POA: Diagnosis not present

## 2015-04-27 DIAGNOSIS — N183 Chronic kidney disease, stage 3 unspecified: Secondary | ICD-10-CM

## 2015-04-27 DIAGNOSIS — F32A Depression, unspecified: Secondary | ICD-10-CM

## 2015-04-27 DIAGNOSIS — I209 Angina pectoris, unspecified: Secondary | ICD-10-CM | POA: Diagnosis not present

## 2015-04-27 DIAGNOSIS — F329 Major depressive disorder, single episode, unspecified: Secondary | ICD-10-CM | POA: Diagnosis not present

## 2015-04-27 LAB — BASIC METABOLIC PANEL WITH GFR
BUN: 27 mg/dL — AB (ref 7–25)
CALCIUM: 9.9 mg/dL (ref 8.6–10.3)
CO2: 29 mmol/L (ref 20–31)
CREATININE: 1.82 mg/dL — AB (ref 0.70–1.25)
Chloride: 97 mmol/L — ABNORMAL LOW (ref 98–110)
GFR, Est African American: 45 mL/min — ABNORMAL LOW (ref >=60)
GFR, Est Non African American: 39 mL/min — ABNORMAL LOW (ref >=60)
Glucose, Bld: 141 mg/dL — ABNORMAL HIGH (ref 65–99)
Potassium: 3.8 mmol/L (ref 3.5–5.3)
SODIUM: 136 mmol/L (ref 135–146)

## 2015-04-27 MED ORDER — BENAZEPRIL HCL 40 MG PO TABS: 40.0000 mg | ORAL_TABLET | Freq: Every day | ORAL | Status: DC

## 2015-04-27 NOTE — Progress Notes (Signed)
    Subjective: CC: Hospital follow up for CP HPI: Patient is a 59 y.o. male presenting to clinic today for office visit. Concerns today include:  1. Hypertension Blood pressure at home: 140-160/80-100's Blood pressure today: 154/96 Meds: Compliant with HCTZ 25 mg, benazepril 40 mg, Coreg 12.5mg  BID, Norvasc 10mg  qd, Also takes Statin and ASA daily.  No concerns. Usually is compliant.  Occ will run out of money to fill medications. Side effects: Occ gets dizzy.  Notes that this happens when he stands up too fast.  Occurs almost daily. ROS: Denies headache, visual changes, nausea, vomiting, chest pain, abdominal pain or shortness of breath.  2. Anxiety [facility]  Weaning from Mirtazapine.  Started on Hydroxyzine.  Takes the new medication maybe once daily.  He notes that hydroxyzine is causing him too much drowsiness.   Has not made next appt yet.  3. CKD 3 Cr elevated to 1.89 at discharge.  Patient reports good urination.  No back pain.  Is not using anything but his prescribed medications.  He also notes that he is hydrating well.  Social History Reviewed: non smoker. FamHx and MedHx updated.  Please see EMR. Health Maintenance: Flu done  ROS: Per HPI  Objective: Office vital signs reviewed. BP 148/96 mmHg  Pulse 90  Temp(Src) 98.6 F (37 C) (Oral)  Ht 6\' 3"  (1.905 m)  Wt 277 lb 8 oz (125.873 kg)  BMI 34.69 kg/m2  Physical Examination:  General: Awake, alert, well nourished, NAD HEENT: Normal, EOMI, MMM Cardio: RRR, S1S2 heard, no murmurs appreciated Pulm: CTAB, no wheezes, rhonchi or rales, normal WOB Extremities: WWP, No edema, cyanosis or clubbing; +2 radial pulses bilaterally MSK: Normal gait and station  Assessment/ Plan: 59 y.o. male   HTN, goal below 130/80 BP 148/96.  Continues to be elevated.  Though patient notes that he frequently runs out of medications and cannot afford to refill them.  Compliance is somewhat of an issue but I  suspect he may have a component of resistant HTN. - Continue current medication regimen for now. - [facility] see if we can [facility] him up with [facility] - Recheck in 3 months - Will consider referral to Dr Raymondo BandKoval if no improvement at that time.  CKD (chronic kidney disease) stage 3, GFR 30-59 ml/min Cr 1.83 at discharge. - Repeat BMET today - Will contact pt with results.  Hyperlipidemia Compliant with Lipitor - Lipid panel from recent hospitalization reviewed - Continue statin and ASA  Mood disorder of depressed type Follows with [facility] - Recommend that he discuss intolerance of Hydroxyzine with them - Will [facility] to [facility] and provide support as needed  Raliegh IpAshly M Ansley Mangiapane, DO PGY-2, [facility]

## 2015-04-27 NOTE — Assessment & Plan Note (Signed)
Cr 1.83 at discharge. - Repeat BMET today - Will contact pt with results.

## 2015-04-27 NOTE — Assessment & Plan Note (Signed)
Compliant with Lipitor - Lipid panel from recent hospitalization reviewed - Continue statin and ASA

## 2015-04-27 NOTE — Assessment & Plan Note (Signed)
BP 148/96.  Continues to be elevated.  Though patient notes that he frequently runs out of medications and cannot afford to refill them.  Compliance is somewhat of an issue but I suspect he may have a component of resistant HTN. - Continue current medication regimen for now. - [facility] - Recheck in 3 months - Will consider referral to Dr Raymondo BandKoval if no improvement at that time.

## 2015-04-27 NOTE — Assessment & Plan Note (Signed)
[facility] - Recommend that he discuss intolerance of Hydroxyzine with them - Will [facility] to [facility] and provide support as needed

## 2015-04-27 NOTE — Patient Instructions (Addendum)
It was a pleasure seeing you today, Chris Vaughn.  Information regarding what we discussed is included in this packet.  Please make an appointment to see me in 6 weeks.  Continue to take your medications as directed.  I will contact you will the results of your labs.  If anything is abnormal, I will call you.  Otherwise, expect a copy to be mailed to you.  Please feel free to call our office at [phone] if any questions or concerns arise.  Warm Regards, Ashly M. Gottschalk, DO Hypertension Hypertension, commonly called high blood pressure, is when the force of blood pumping through your arteries is too strong. Your arteries are the blood vessels that carry blood from your heart throughout your body. A blood pressure reading consists of a higher number over a lower number, such as 110/72. The higher number (systolic) is the pressure inside your arteries when your heart pumps. The lower number (diastolic) is the pressure inside your arteries when your heart relaxes. Ideally you want your blood pressure below 120/80. Hypertension forces your heart to work harder to pump blood. Your arteries may become narrow or stiff. Having untreated or uncontrolled hypertension can cause heart attack, stroke, kidney disease, and other problems. RISK FACTORS Some risk factors for high blood pressure are controllable. Others are not.  Risk factors you cannot control include:   Race. You may be at higher risk if you are African American.  Age. Risk increases with age.  Gender. Men are at higher risk than women before age 30 years. After age 58, women are at higher risk than men. Risk factors you can control include:  Not getting enough exercise or physical activity.  Being overweight.  Getting too much fat, sugar, calories, or salt in your diet.  Drinking too much alcohol. SIGNS AND SYMPTOMS Hypertension does not usually cause signs or symptoms. Extremely high blood pressure (hypertensive crisis) may  cause headache, anxiety, shortness of breath, and nosebleed. DIAGNOSIS To check if you have hypertension, your health care provider will measure your blood pressure while you are seated, with your arm held at the level of your heart. It should be measured at least twice using the same arm. Certain conditions can cause a difference in blood pressure between your right and left arms. A blood pressure reading that is higher than normal on one occasion does not mean that you need treatment. If it is not clear whether you have high blood pressure, you may be asked to return on a different day to have your blood pressure checked again. Or, you may be asked to monitor your blood pressure at home for 1 or more weeks. TREATMENT Treating high blood pressure includes making lifestyle changes and possibly taking medicine. Living a healthy lifestyle can help lower high blood pressure. You may need to change some of your habits. Lifestyle changes may include:  Following the DASH diet. This diet is high in fruits, vegetables, and whole grains. It is low in salt, red meat, and added sugars.  Keep your sodium intake below 2,300 mg per day.  Getting at least 30-45 minutes of aerobic exercise at least 4 times per week.  Losing weight if necessary.  Not smoking.  Limiting alcoholic beverages.  Learning ways to reduce stress. Your health care provider may prescribe medicine if lifestyle changes are not enough to get your blood pressure under control, and if one of the following is true:  You are 91-19 years of age and your systolic blood  pressure is above 140.  You are 61 years of age or older, and your systolic blood pressure is above 150.  Your diastolic blood pressure is above 90.  You have diabetes, and your systolic blood pressure is over 140 or your diastolic blood pressure is over 90.  You have kidney disease and your blood pressure is above 140/90.  You have heart disease and your blood pressure  is above 140/90. Your personal target blood pressure may vary depending on your medical conditions, your age, and other factors. HOME CARE INSTRUCTIONS  Have your blood pressure rechecked as directed by your health care provider.   Take medicines only as directed by your health care provider. Follow the directions carefully. Blood pressure medicines must be taken as prescribed. The medicine does not work as well when you skip doses. Skipping doses also puts you at risk for problems.  Do not smoke.   Monitor your blood pressure at home as directed by your health care provider. SEEK MEDICAL CARE IF:   You think you are having a reaction to medicines taken.  You have recurrent headaches or feel dizzy.  You have swelling in your ankles.  You have trouble with your vision. SEEK IMMEDIATE MEDICAL CARE IF:  You develop a severe headache or confusion.  You have unusual weakness, numbness, or feel faint.  You have severe chest or abdominal pain.  You vomit repeatedly.  You have trouble breathing. MAKE SURE YOU:   Understand these instructions.  Will watch your condition.  Will get help right away if you are not doing well or get worse.   This information is not intended to replace advice given to you by your health care provider. Make sure you discuss any questions you have with your health care provider.   Document Released: 06/27/2005 Document Revised: 11/11/2014 Document Reviewed: 04/19/2013 Elsevier Interactive Patient Education [employer]vier Inc.

## 2015-05-08 ENCOUNTER — Inpatient Hospital Stay: Payer: Medicare Other | Admitting: Family Medicine

## 2015-06-17 NOTE — ED Provider Notes (Signed)
CSN: [id number]     Arrival date & time 04/21/15  2043 History   First MD Initiated Contact with Patient 04/22/15 0013     Chief Complaint  Patient presents with  . Chest Pain    The patient said he was having chest pain and throat pain for about two days.  The patient said he has had a stroke in the past so he did not want to let it go.      (Consider location/radiation/quality/duration/timing/severity/associated sxs/prior Treatment) HPI Comments: Pt comes in with cc of chest pain. PMH is significant for H/o CVA, CKD, HLD, HTN, DM2. Pt has no hx of CAD. Reports that he started having some central chest pain, with radiation to the jaw after dinner. Pt's chest pain is mild, constant and has no specific aggravating or relieving factors. Pt has no n/v/diophoresis/dib. Pt has no hx of PE, DVT and denies any exogenous estrogen use, long distance travels or surgery in the past 6 weeks, active cancer, recent immobilization.    ROS 10 Systems reviewed and are negative for acute change except as noted in the HPI.     Patient is a 59 y.o. male presenting with chest pain. The history is provided by the patient.  Chest Pain   Past Medical History  Diagnosis Date  . Hypertension   . Depression   . Diabetes mellitus without complication (HCC)   . Stroke (HCC)   . Hyperlipidemia    Past Surgical History  Procedure Laterality Date  . Back surgery     Family History  Problem Relation Age of Onset  . Diabetes Mother   . Heart disease Mother   . Hypertension Father    Social History  Substance Use Topics  . Smoking status: Never Smoker   . Smokeless tobacco: Never Used  . Alcohol Use: No    Review of Systems  Cardiovascular: Positive for chest pain.      Allergies  Review of patient's allergies indicates no known allergies.  Home Medications   Prior to Admission medications   Medication Sig Start Date End Date Taking? Authorizing Provider  amLODipine (NORVASC) 10 MG  tablet TAKE ONE TABLET BY MOUTH ONCE DAILY. 12/10/14  Yes Bryan R Hess, DO  glimepiride (AMARYL) 2 MG tablet Take 1 tablet (2 mg total) by mouth daily with breakfast. 11/06/14  Yes Bryan R Hess, DO  hydrochlorothiazide (HYDRODIURIL) 25 MG tablet Take 1 tablet (25 mg total) by mouth daily. 11/06/14  Yes Bryan R Hess, DO  hydrOXYzine (ATARAX/VISTARIL) 25 MG tablet Take 25-50 mg by mouth See admin instructions. Take 1 tablet three time daily and take 2 tablets at bedtime   Yes Historical Provider, MD  mirtazapine (REMERON) 15 MG tablet Take 7.5 mg by mouth at bedtime.   Yes Historical Provider, MD  aspirin EC 81 MG EC tablet Take 1 tablet (81 mg total) by mouth daily. Patient not taking: Reported on 11/06/2014 10/17/13   Narda Bonds, MD  atorvastatin (LIPITOR) 40 MG tablet Take 1 tablet (40 mg total) by mouth daily at 6 PM. 04/23/15   Almon Hercules, MD  benazepril (LOTENSIN) 40 MG tablet Take 1 tablet (40 mg total) by mouth daily. 04/27/15   Ashly Hulen Skains, DO  carvedilol (COREG) 12.5 MG tablet Take 1 tablet (12.5 mg total) by mouth 2 (two) times daily with a meal. 04/23/15   Almon Hercules, MD  citalopram (CELEXA) 20 MG tablet Take 1 tablet (20 mg total) by mouth daily.  Patient not taking: Reported on 04/22/2015 11/11/14   Twana First Hess, DO   BP 148/99 mmHg  Pulse 83  Temp(Src) 98.2 F (36.8 C) (Oral)  Resp 18  Ht  (1.905 m)  Wt 279 lb 11.2 oz (126.871 kg)  BMI 34.96 kg/m2  SpO2 96% Physical Exam  Constitutional: He is oriented to person, place, and time. He appears well-developed.  HENT:  Head: Atraumatic.  Neck: Neck supple. No JVD present.  Cardiovascular: Normal rate.   Pulmonary/Chest: Effort normal. No respiratory distress. He has no wheezes.  Abdominal: There is no tenderness.  Musculoskeletal: He exhibits no edema.  Neurological: He is alert and oriented to person, place, and time.  Skin: Skin is warm.  Nursing note and vitals reviewed.   ED Course  Procedures (including  critical care time) Labs Review Labs Reviewed  BASIC METABOLIC PANEL - Abnormal; Notable for the following:    Sodium 131 (*)    Potassium 3.4 (*)    Chloride 94 (*)    Glucose, Bld 151 (*)    BUN 25 (*)    Creatinine, Ser 1.98 (*)    GFR calc non Af Amer 35 (*)    GFR calc Af Amer 40 (*)    All other components within normal limits  CREATININE, SERUM - Abnormal; Notable for the following:    Creatinine, Ser 1.83 (*)    GFR calc non Af Amer 38 (*)    GFR calc Af Amer 44 (*)    All other components within normal limits  LIPID PANEL - Abnormal; Notable for the following:    Triglycerides 165 (*)    All other components within normal limits  HEMOGLOBIN A1C - Abnormal; Notable for the following:    Hgb A1c MFr Bld 7.5 (*)    All other components within normal limits  GLUCOSE, CAPILLARY - Abnormal; Notable for the following:    Glucose-Capillary 140 (*)    All other components within normal limits  GLUCOSE, CAPILLARY - Abnormal; Notable for the following:    Glucose-Capillary 161 (*)    All other components within normal limits  GLUCOSE, CAPILLARY - Abnormal; Notable for the following:    Glucose-Capillary 152 (*)    All other components within normal limits  GLUCOSE, CAPILLARY - Abnormal; Notable for the following:    Glucose-Capillary 117 (*)    All other components within normal limits  CBG MONITORING, ED - Abnormal; Notable for the following:    Glucose-Capillary 129 (*)    All other components within normal limits  CBG MONITORING, ED - Abnormal; Notable for the following:    Glucose-Capillary 132 (*)    All other components within normal limits  CBC  TROPONIN I  TROPONIN I  CBC  TROPONIN I  TROPONIN I  TSH  TROPONIN I  I-STAT TROPOININ, ED    Imaging Review No results found. I have personally reviewed and evaluated these images and lab results as part of my medical decision-making.   EKG Interpretation   Date/Time:  Wednesday April 22 2015 10:29:51  EDT Ventricular Rate:  100 PR Interval:  203 QRS Duration: 86 QT Interval:  354 QTC Calculation: 457 R Axis:   -34 Text Interpretation:  Sinus tachycardia Probable left atrial enlargement  Abnormal R-wave progression, early transition Left ventricular hypertrophy  ED PHYSICIAN INTERPRETATION AVAILABLE IN [facility] Confirmed by  TEST, Record ([id number]) on 04/23/2015 6:39:23 AM      MDM   Final diagnoses:  Angina pectoris (HCC)  Pt comes in with cc of chest pain. He has a HEAR score of 4 - 1 for hx, 1 for age and 2 for risk factors. He is having mild pain here. EKG is reassuring and the initial trop is neg. Pt's BP is slightly elevated. Will give aspirin. On the ddx - ACS is highest, primarily due to the risk factors and known vascular dz (strokes). Pt certainly has no PE like symptoms and CXR is clear r/o a lot of other emergent pathology. Pan could be GI in etiology as well - but we will admit for serial trops.    Derwood KaplanAnkit Luceil Herrin, MD 06/17/15 [phone]

## 2015-06-19 ENCOUNTER — Encounter: Payer: Self-pay | Admitting: Family Medicine

## 2015-06-19 ENCOUNTER — Ambulatory Visit (INDEPENDENT_AMBULATORY_CARE_PROVIDER_SITE_OTHER): Payer: Medicare Other | Admitting: Family Medicine

## 2015-06-19 VITALS — BP 123/78 | HR 96 | Temp 98.7°F | Ht 75.0 in | Wt 279.7 lb

## 2015-06-19 DIAGNOSIS — F329 Major depressive disorder, single episode, unspecified: Secondary | ICD-10-CM | POA: Diagnosis not present

## 2015-06-19 DIAGNOSIS — E119 Type 2 diabetes mellitus without complications: Secondary | ICD-10-CM | POA: Diagnosis present

## 2015-06-19 DIAGNOSIS — I209 Angina pectoris, unspecified: Secondary | ICD-10-CM

## 2015-06-19 DIAGNOSIS — F32A Depression, unspecified: Secondary | ICD-10-CM

## 2015-06-19 DIAGNOSIS — I10 Essential (primary) hypertension: Secondary | ICD-10-CM

## 2015-06-19 LAB — POCT GLYCOSYLATED HEMOGLOBIN (HGB A1C): HEMOGLOBIN A1C: 6.3

## 2015-06-19 MED ORDER — GLIMEPIRIDE 2 MG PO TABS: 1.0000 mg | ORAL_TABLET | Freq: Every day | ORAL | Status: DC

## 2015-06-19 NOTE — Patient Instructions (Addendum)
Stop Buspirone. It sounds like you are having an allergic reaction to this medication.  [facility] for further instructions or a new medication to replace this one.  Take 1/2 tablet of your glimeperide for now and see me back in 3 months.

## 2015-06-19 NOTE — Assessment & Plan Note (Signed)
Well controlled today - Continue current medication regimen - Patient to return for PE with fasting labs.  Will plan to check BMP at that time.

## 2015-06-19 NOTE — Assessment & Plan Note (Addendum)
A1c better than last visit.  6.3 today.  Patient with low BGs in the 60s at home. - Agree with decreasing Amaryl for now.  Discussed that was should consider changing his medication regimen at next visit if continues to have low BGs with 1mg  Amaryl. - Return precautions discussed - Repeat A1c, BMP at PE

## 2015-06-19 NOTE — Assessment & Plan Note (Signed)
Allergic reaction to Buspar. - Patient to discontinue medication - Apparently not taking Ativan regularly, so instructed to call Psychiatrist to inform of adverse effect with Buspar.  If cannot get in touch, instructed to use Ativan 0.5mg -1mg  twice a day as needed anxiety until he can get in touch with his psychiatrist on Monday - Return precautions reviewed

## 2015-06-19 NOTE — Progress Notes (Signed)
Subjective: CC: HYPERTENSION, anxiety HPI: Chris Vaughn is a 59 y.o. male presenting to clinic today for office visit. Concerns today include:  1. Hypertension Blood pressure at home: does not monitor Blood pressure today: 123/78 Meds: Compliant with Norvasc, Coreg, Benazepril, HCTZ Side effects: none ROS: Denies headache, dizziness, visual changes, nausea, vomiting, chest pain, abdominal pain or shortness of breath.  2. Diabetes:  High at home: 100's Low at home: 60 Taking medications: Glimepiride   Side effects: occ diaphoresis and dizziness.  He checks his sugars at that time and sees that they are in 60s.  He has changed his Glimepiride from  to  to accommodate this and has done well with the change. ROS: denies fever, chills, dizziness, LOC, polyuria, polydipsia, numbness or tingling in extremities or chest pain. Last eye exam: >1 year Last foot exam: due but does not have time Last A1c: 10/21/2014, 7.5 Nephropathy screen indicated?: on ACE-I Last flu, zoster and/or pneumovax: Zostavax, pneumonia vacc, TDap, eye exam, colonscopy,  3. Anxiety Patient seen at [facility] for this.  He reports that he is now taking Buspar, Ativan and Remeron.  He notes that Buspar was started most recently.  He reports a rash on his chest with this medication and some shortness of breath.  Symptoms started about 1-2 days after starting medication.  He has discontinued the medication. He is wondering what he should do about his anxiety, as it is bad around the holidays because this is when his mother passed away.  He is well supported by his wife, who has accompanied him to his appointment.  Currently, no shortness of breath or rash.  Social History Reviewed. FamHx and MedHx reviewed.  Please see EMR. Health Maintenance: pneumonia shot,   ROS: Per HPI  Objective: Office vital signs reviewed. BP 123/78 mmHg  Pulse 96  Temp(Src) 98.7 F (37.1 C) (Oral)  Ht  (1.905 m)  Wt 279  lb 11.2 oz (126.871 kg)  BMI 34.96 kg/m2  Physical Examination:  General: Awake, alert, No acute distress, accompanied by wife HEENT: Normal, MMM Cardio: regular rate and rhythm, S1S2 heard, no murmurs appreciated Pulm: clear to auscultation bilaterally, no wheezes, rhonchi or rales, normal WOB MSK: Normal gait and station Psych: good eye contact, mood stable, speech normal, thought process normal  Results for orders placed or performed in visit on 06/19/15 (from the past 24 hour(s))  HgB A1c     Status: None   Collection Time: 06/19/15  3:10 PM  Result Value Ref Range   Hemoglobin A1C 6.3    Assessment/ Plan: 59 y.o. male   HTN, goal below 130/80 Well controlled today - Continue current medication regimen - Patient to return for PE with fasting labs.  Will plan to check BMP at that time.  Type 2 diabetes mellitus without complication, without long-term current use of insulin (HCC) A1c better than last visit.  6.3 today.  Patient with low BGs in the 60s at home. - Agree with decreasing Amaryl for now.  Discussed that was should consider changing his medication regimen at next visit if continues to have low BGs with  Amaryl. - Return precautions discussed - Repeat A1c, BMP at PE  Mood disorder of depressed type Allergic reaction to Buspar. - Patient to discontinue medication - Apparently not taking Ativan regularly, so instructed to call Psychiatrist to inform of adverse effect with Buspar.  If cannot get in touch, instructed to use Ativan 0.5mg -  twice a day as needed anxiety  until he can get in touch with his psychiatrist on Monday - Return precautions reviewed    Raliegh IpAshly M Junia Nygren, DO PGY-2, [facility]

## 2015-06-27 ENCOUNTER — Encounter (HOSPITAL_COMMUNITY): Payer: Self-pay | Admitting: Emergency Medicine

## 2015-06-27 ENCOUNTER — Emergency Department (HOSPITAL_COMMUNITY)
Admission: EM | Admit: 2015-06-27 | Discharge: 2015-06-28 | Disposition: A | Discharge status: Home / Self Care | Payer: Medicare Other | Attending: Emergency Medicine | Admitting: Emergency Medicine

## 2015-06-27 DIAGNOSIS — R51 Headache: Secondary | ICD-10-CM | POA: Insufficient documentation

## 2015-06-27 DIAGNOSIS — R42 Dizziness and giddiness: Secondary | ICD-10-CM | POA: Diagnosis not present

## 2015-06-27 DIAGNOSIS — Z79899 Other long term (current) drug therapy: Secondary | ICD-10-CM | POA: Diagnosis not present

## 2015-06-27 DIAGNOSIS — F329 Major depressive disorder, single episode, unspecified: Secondary | ICD-10-CM | POA: Insufficient documentation

## 2015-06-27 DIAGNOSIS — R110 Nausea: Secondary | ICD-10-CM | POA: Diagnosis not present

## 2015-06-27 DIAGNOSIS — E785 Hyperlipidemia, unspecified: Secondary | ICD-10-CM | POA: Diagnosis not present

## 2015-06-27 DIAGNOSIS — Z8673 Personal history of transient ischemic attack (TIA), and cerebral infarction without residual deficits: Secondary | ICD-10-CM | POA: Diagnosis not present

## 2015-06-27 DIAGNOSIS — E871 Hypo-osmolality and hyponatremia: Secondary | ICD-10-CM | POA: Diagnosis not present

## 2015-06-27 DIAGNOSIS — Z7982 Long term (current) use of aspirin: Secondary | ICD-10-CM | POA: Insufficient documentation

## 2015-06-27 DIAGNOSIS — F419 Anxiety disorder, unspecified: Secondary | ICD-10-CM | POA: Insufficient documentation

## 2015-06-27 DIAGNOSIS — T43215A Adverse effect of selective serotonin and norepinephrine reuptake inhibitors, initial encounter: Secondary | ICD-10-CM | POA: Diagnosis not present

## 2015-06-27 DIAGNOSIS — I10 Essential (primary) hypertension: Secondary | ICD-10-CM | POA: Insufficient documentation

## 2015-06-27 DIAGNOSIS — R451 Restlessness and agitation: Secondary | ICD-10-CM | POA: Insufficient documentation

## 2015-06-27 DIAGNOSIS — T50905A Adverse effect of unspecified drugs, medicaments and biological substances, initial encounter: Secondary | ICD-10-CM

## 2015-06-27 LAB — CBC WITH DIFFERENTIAL/PLATELET
BASOS PCT: 0 %
Basophils Absolute: 0 10*3/uL (ref 0.0–0.1)
EOS ABS: 0.1 10*3/uL (ref 0.0–0.7)
Eosinophils Relative: 1 %
HCT: 41 % (ref 39.0–52.0)
Hemoglobin: 14 g/dL (ref 13.0–17.0)
Lymphocytes Relative: 23 %
Lymphs Abs: 1.4 10*3/uL (ref 0.7–4.0)
MCH: 28.1 pg (ref 26.0–34.0)
MCHC: 34.1 g/dL (ref 30.0–36.0)
MCV: 82.3 fL (ref 78.0–100.0)
MONO ABS: 0.8 10*3/uL (ref 0.1–1.0)
MONOS PCT: 13 %
NEUTROS PCT: 63 %
Neutro Abs: 3.9 10*3/uL (ref 1.7–7.7)
Platelets: 277 10*3/uL (ref 150–400)
RBC: 4.98 MIL/uL (ref 4.22–5.81)
RDW: 13.4 % (ref 11.5–15.5)
WBC: 6.2 10*3/uL (ref 4.0–10.5)

## 2015-06-27 LAB — URINALYSIS, ROUTINE W REFLEX MICROSCOPIC
Bilirubin Urine: NEGATIVE
GLUCOSE, UA: 250 mg/dL — AB
Ketones, ur: NEGATIVE mg/dL
LEUKOCYTES UA: NEGATIVE
Nitrite: NEGATIVE
PH: 6.5 (ref 5.0–8.0)
PROTEIN: NEGATIVE mg/dL
Specific Gravity, Urine: 1.005 (ref 1.005–1.030)

## 2015-06-27 LAB — COMPREHENSIVE METABOLIC PANEL
ALBUMIN: 4 g/dL (ref 3.5–5.0)
ALT: 30 U/L (ref 17–63)
ANION GAP: 9 (ref 5–15)
AST: 37 U/L (ref 15–41)
Alkaline Phosphatase: 76 U/L (ref 38–126)
BUN: 21 mg/dL — ABNORMAL HIGH (ref 6–20)
CO2: 26 mmol/L (ref 22–32)
Calcium: 9.1 mg/dL (ref 8.9–10.3)
Chloride: 94 mmol/L — ABNORMAL LOW (ref 101–111)
Creatinine, Ser: 1.92 mg/dL — ABNORMAL HIGH (ref 0.61–1.24)
GFR calc Af Amer: 42 mL/min — ABNORMAL LOW (ref >60)
GFR calc non Af Amer: 36 mL/min — ABNORMAL LOW (ref >60)
GLUCOSE: 228 mg/dL — AB (ref 65–99)
POTASSIUM: 3.3 mmol/L — AB (ref 3.5–5.1)
SODIUM: 129 mmol/L — AB (ref 135–145)
TOTAL PROTEIN: 7.5 g/dL (ref 6.5–8.1)
Total Bilirubin: 0.9 mg/dL (ref 0.3–1.2)

## 2015-06-27 LAB — URINE MICROSCOPIC-ADD ON
BACTERIA UA: NONE SEEN
WBC, UA: NONE SEEN WBC/hpf (ref 0–5)

## 2015-06-27 MED ORDER — SODIUM CHLORIDE 0.9 % IV BOLUS (SEPSIS)
1000.0000 mL | Freq: Once | INTRAVENOUS | Status: AC
  Administered 2015-06-27: 1000 mL via INTRAVENOUS

## 2015-06-27 NOTE — ED Notes (Signed)
Iv attempt x2.

## 2015-06-27 NOTE — ED Provider Notes (Signed)
CSN: [id number]     Arrival date & time 06/27/15  1951 History   First MD Initiated Contact with Patient 06/27/15 2041     Chief Complaint  Patient presents with  . Medication Reaction  . Dizziness     (Consider location/radiation/quality/duration/timing/severity/associated sxs/prior Treatment) HPI   Chris Vaughn is a 59 y.o M with a pmhx of HTN, DM, CVA, HLD, depression who presents to the ED today c/o dizziness. Pt states that he took a new prescribed medication, duloxetine around 7pm last night. He has never taken this medication before. Pt states that around 3am he woke up from sleep and felt very restless, agitated and lightheaded. Pt states that he had a tingling sensation in the left side of his head that lated very briefly with associated headache. Pt also states that he felt dizzy and nauseous. No vomiting. NO syncope. Pt states that he was able to go back to sleep, but when he woke up this morning he did not feel any better. Pt is concerned that he may have had an allergic reaction to this medication. Denies CP, SOB, paresthesias, weakness, fever, chills, night sweats. Pt does not believe he has taken SSRIs in the past. He currently takes lorazepam for anxiety.   Past Medical History  Diagnosis Date  . Hypertension   . Depression   . Diabetes mellitus without complication (HCC)   . Stroke (HCC)   . Hyperlipidemia    Past Surgical History  Procedure Laterality Date  . Back surgery     Family History  Problem Relation Age of Onset  . Diabetes Mother   . Heart disease Mother   . Hypertension Father    Social History  Substance Use Topics  . Smoking status: Never Smoker   . Smokeless tobacco: Never Used  . Alcohol Use: No    Review of Systems  All other systems reviewed and are negative.     Allergies  Review of patient's allergies indicates no known allergies.  Home Medications   Prior to Admission medications   Medication Sig Start Date End Date  Taking? Authorizing Provider  amLODipine (NORVASC) 10 MG tablet TAKE ONE TABLET BY MOUTH ONCE DAILY. 12/10/14   Briscoe Deutscher, DO  aspirin EC 81 MG EC tablet Take 1 tablet (81 mg total) by mouth daily. Patient not taking: Reported on 11/06/2014 10/17/13   Narda Bonds, MD  atorvastatin (LIPITOR) 40 MG tablet Take 1 tablet (40 mg total) by mouth daily at 6 PM. 04/23/15   Almon Hercules, MD  benazepril (LOTENSIN) 40 MG tablet Take 1 tablet (40 mg total) by mouth daily. 04/27/15   Ashly Hulen Skains, DO  carvedilol (COREG) 12.5 MG tablet Take 1 tablet (12.5 mg total) by mouth 2 (two) times daily with a meal. 04/23/15   Almon Hercules, MD  glimepiride (AMARYL) 2 MG tablet Take 0.5 tablets (1 mg total) by mouth daily with breakfast. 06/19/15   [name], DO  hydrochlorothiazide (HYDRODIURIL) 25 MG tablet Take 1 tablet (25 mg total) by mouth daily. 11/06/14   Twana First Hess, DO  mirtazapine (REMERON) 15 MG tablet Take 7.5 mg by mouth at bedtime.    Historical Provider, MD   BP 142/89 mmHg  Pulse 76  Temp(Src) 99.4 F (37.4 C)  Resp 18  Ht  (1.905 m)  Wt 127.461 kg  BMI 35.12 kg/m2  SpO2 96% Physical Exam  Constitutional: He is oriented to person, place, and time. He appears well-developed  and well-nourished. No distress.  HENT:  Head: Normocephalic and atraumatic.  Mouth/Throat: No oropharyngeal exudate.  Eyes: Conjunctivae and EOM are normal. Pupils are equal, round, and reactive to light. Right eye exhibits no discharge. Left eye exhibits no discharge. No scleral icterus.  Cardiovascular: Normal rate, regular rhythm, normal heart sounds and intact distal pulses.  Exam reveals no gallop and no friction rub.   No murmur heard. Pulmonary/Chest: Effort normal and breath sounds normal. No respiratory distress. He has no wheezes. He has no rales. He exhibits no tenderness.  Abdominal: Soft. He exhibits no distension. There is no tenderness. There is no guarding.  Musculoskeletal: Normal range  of motion. He exhibits no edema or tenderness.  Neurological: He is alert and oriented to person, place, and time. He displays normal reflexes. No cranial nerve deficit. He exhibits normal muscle tone. Coordination normal.  Strength 5/5 throughout. No sensory deficits.  No gait abnormality. Normal finger to nose. Normal heel to shin.   Skin: Skin is warm and dry. No rash noted. He is not diaphoretic. No erythema. No pallor.  Psychiatric: He has a normal mood and affect. His behavior is normal.  Nursing note and vitals reviewed.   ED Course  Procedures (including critical care time) Labs Review Labs Reviewed  COMPREHENSIVE METABOLIC PANEL - Abnormal; Notable for the following:    Sodium 129 (*)    Potassium 3.3 (*)    Chloride 94 (*)    Glucose, Bld 228 (*)    BUN 21 (*)    Creatinine, Ser 1.92 (*)    GFR calc non Af Amer 36 (*)    GFR calc Af Amer 42 (*)    All other components within normal limits  URINALYSIS, ROUTINE W REFLEX MICROSCOPIC (NOT AT [facility]) - Abnormal; Notable for the following:    Glucose, UA 250 (*)    Hgb urine dipstick SMALL (*)    All other components within normal limits  URINE MICROSCOPIC-ADD ON - Abnormal; Notable for the following:    Squamous Epithelial / LPF 0-5 (*)    All other components within normal limits  CBC WITH DIFFERENTIAL/PLATELET    Imaging Review No results found. I have personally reviewed and evaluated these images and lab results as part of my medical decision-making.   EKG Interpretation   Date/Time:  Saturday June 27 2015 22:05:11 EST Ventricular Rate:  74 PR Interval:  188 QRS Duration: 94 QT Interval:  410 QTC Calculation: 455 R Axis:   -19 Text Interpretation:  Sinus rhythm Borderline left axis deviation Abnormal  R-wave progression, early transition No significant change since last  tracing Confirmed by NGUYEN, EMILY ([id number]) on 06/28/2015 12:00:27 AM      MDM   Final diagnoses:  Lightheadedness  Hyponatremia   Medication reaction, initial encounter   59 y.o Male presents with symptoms of lightheadedness and restlessness after taking a new medication, duloxetine last night. Pt appears well in ED, non-toxic. VSS. No syncope. Pt ambulating well in ED. Normal orthostatic vital signs. No tremor or clonus, do not suspect serotonin syndrome. Na low at 129. Likely due to dehydration. Pt given 1 L fluids. Pt reports symptomatic improvement after fluid rescusitation. Cr and BUN at pt baseline. EKG unremarkable. All other lab work wnl. Encouraged cessation of duloxetine and follow up with PCP to re-evaluate and adjust medications. Pt hemodynamically stable and ready for discharge. Return precautions outlined in patient discharge instructions.   Patient was discussed with and seen by Dr. Cyndie Chime who agrees  with the treatment plan.        Lester KinsmanSamantha Tripp ConnellDowless, PA-C 06/28/15 2025  Leta BaptistEmily Roe Nguyen, MD 07/02/15 [phone]

## 2015-06-27 NOTE — ED Notes (Signed)
IV team at bedside 

## 2015-06-27 NOTE — ED Notes (Signed)
Pt. reports multiple symptoms ( dizziness ,lightheaded and feeling nervous / agitated) after taking new prescription Duloxetine yesterday for depression . Denies hallucinations or suicidal ideation .

## 2015-06-27 NOTE — ED Notes (Signed)
Attempted IV x 2 unsuccessful.

## 2015-06-28 NOTE — ED Notes (Signed)
Pt left at this time with all belongings.  

## 2015-06-28 NOTE — Discharge Instructions (Signed)
Dizziness Dizziness is a common problem. It is a feeling of unsteadiness or light-headedness. You may feel like you are about to faint. Dizziness can lead to injury if you stumble or fall. Anyone can become dizzy, but dizziness is more common in older adults. This condition can be caused by a number of things, including medicines, dehydration, or illness. HOME CARE INSTRUCTIONS Taking these steps may help with your condition: Eating and Drinking  Drink enough fluid to keep your urine clear or pale yellow. This helps to keep you from becoming dehydrated. Try to drink more clear fluids, such as water.  Do not drink alcohol.  Limit your caffeine intake if directed by your health care provider.  Limit your salt intake if directed by your health care provider. Activity  Avoid making quick movements.  Rise slowly from chairs and steady yourself until you feel okay.  In the morning, first sit up on the side of the bed. When you feel okay, stand slowly while you hold onto something until you know that your balance is fine.  Move your legs often if you need to stand in one place for a long time. Tighten and relax your muscles in your legs while you are standing.  Do not drive or operate heavy machinery if you feel dizzy.  Avoid bending down if you feel dizzy. Place items in your home so that they are easy for you to reach without leaning over. Lifestyle  Do not use any tobacco products, including cigarettes, chewing tobacco, or electronic cigarettes. If you need help quitting, ask your health care provider.  Try to reduce your stress level, such as with yoga or meditation. Talk with your health care provider if you need help. General Instructions  Watch your dizziness for any changes.  Take medicines only as directed by your health care provider. Talk with your health care provider if you think that your dizziness is caused by a medicine that you are taking.  Tell a friend or a family  member that you are feeling dizzy. If he or she notices any changes in your behavior, have this person call your health care provider.  Keep all follow-up visits as directed by your health care provider. This is important. SEEK MEDICAL CARE IF:  Your dizziness does not go away.  Your dizziness or light-headedness gets worse.  You feel nauseous.  You have reduced hearing.  You have new symptoms.  You are unsteady on your feet or you feel like the room is spinning. SEEK IMMEDIATE MEDICAL CARE IF:  You vomit or have diarrhea and are unable to eat or drink anything.  You have problems talking, walking, swallowing, or using your arms, hands, or legs.  You feel generally weak.  You are not thinking clearly or you have trouble forming sentences. It may take a friend or family member to notice this.  You have chest pain, abdominal pain, shortness of breath, or sweating.  Your vision changes.  You notice any bleeding.  You have a headache.  You have neck pain or a stiff neck.  You have a fever.   This information is not intended to replace advice given to you by your health care provider. Make sure you discuss any questions you have with your health care provider.   Document Released: 12/21/2000 Document Revised: 11/11/2014 Document Reviewed: 06/23/2014 Elsevier Interactive Patient Education 2016 [employer].  Drug Allergy Allergic reactions to medicines are common. Some allergic reactions are mild. A delayed type of drug  allergy that occurs 1 week or more after exposure to a medicine or vaccine is called serum sickness. A life-threatening, sudden (acute) allergic reaction that involves the whole body is called anaphylaxis. CAUSES  "True" drug allergies occur when there is an allergic reaction to a medicine. This is caused by overactivity of the immune system. First, the body becomes sensitized. The immune system is triggered by your first exposure to the medicine. Following  this first exposure, future exposure to the same medicine may be life-threatening. Almost any medicine can cause an allergic reaction. Common ones are:  Penicillin.  Sulfonamides (sulfa drugs).  Local anesthetics.  X-ray dyes that contain iodine. SYMPTOMS  Common symptoms of a minor allergic reaction are:  Swelling around the mouth.  An itchy red rash or hives.  Vomiting or diarrhea. Anaphylaxis can cause swelling of the mouth and throat. This makes it difficult to breathe and swallow. Severe reactions can be fatal within seconds, even after exposure to only a trace amount of the drug that causes the reaction. HOME CARE INSTRUCTIONS  If you are unsure of what caused your reaction, write down:  The names of the medicines you took.  How much medicine you took.  How you took the medicine, such as whether you took a pill, injected the medicine, or applied it to your skin.  All of the things you ate and drank.  The date and time of your reaction.  The symptoms of the reaction.  You may want to follow up with an allergy specialist after the reaction has cleared in order to be tested to confirm the allergy. It is important to confirm that your reaction is an allergy, not just a side effect to the medicine. If you have a true allergy to a medicine, this may prevent that medicine and related medicines from being given to you when you are very ill.  If you have hives or a rash:  Take medicines as directed by your caregiver.  You may use an over-the-counter antihistamine (diphenhydramine) as needed.  Apply cold compresses to the skin or take baths in cool water. Avoid hot baths or showers.  If you are severely allergic:  Continuous observation after a severe reaction may be needed. Hospitalization is often required.  Wear a medical alert bracelet or necklace stating your allergy.  You and your family must learn how to use an anaphylaxis kit or give an epinephrine injection to  temporarily treat an emergency allergic reaction. If you have had a severe reaction, always carry your epinephrine injection or anaphylaxis kit with you. This can be lifesaving if you have a severe reaction.  Do not drive or perform tasks after treatment until the medicines used to treat your reaction have worn off, or until your caregiver says it is okay.  If you have a drug allergy that was confirmed by your health care provider:  Carry information about the drug allergy with you at all times.  Always check with a pharmacist before taking any over-the-counter medicine. SEEK MEDICAL CARE IF:   You think you had an allergic reaction. Symptoms usually start within 30 minutes after exposure.  Symptoms are getting worse rather than better.  You develop new symptoms.  The symptoms that brought you to your caregiver return. SEEK IMMEDIATE MEDICAL CARE IF:   You have swelling of the mouth, difficulty breathing, or wheezing.  You have a tight feeling in your chest or throat.  You develop hives, swelling, or itching all over your body.  You develop severe vomiting or diarrhea.  You feel faint or pass out. This is an emergency. Use your epinephrine injection or anaphylaxis kit as you have been instructed. Call for emergency medical help. Even if you improve after the injection, you need to be examined at a hospital emergency department. MAKE SURE YOU:   Understand these instructions.  Will watch your condition.  Will get help right away if you are not doing well or get worse.   This information is not intended to replace advice given to you by your health care provider. Make sure you discuss any questions you have with your health care provider.   Document Released: 06/27/2005 Document Revised: 07/18/2014 Document Reviewed: 01/27/2015 Elsevier Interactive Patient Education 2016 [employer].  Hyponatremia Hyponatremia is when the amount of salt (sodium) in your blood is too low.  When salt levels are low, your cells absorb extra water and they swell. The swelling happens throughout the body, but it mostly affects the brain.  HOME CARE  Take medicines only as told by your doctor. Many medicines can make this condition worse. Talk with your doctor about any medicines that you are currently taking.  Carefully follow a recommended diet as told by your doctor.  Carefully follow instructions from your doctor about fluid restrictions.  Keep all follow-up visits as told by your doctor. This is important.  Do not drink alcohol. GET HELP IF:  You feel sicker to your stomach (nauseous).  You feel more confused.  You feel more tired (fatigued).  Your headache gets worse.  You feel weaker.  Your symptoms go away and then they come back.  You have trouble following the diet instructions. GET HELP RIGHT AWAY IF:  You start to twitch and shake (have a seizure).  You pass out (faint).  You keep having watery poop (diarrhea).  You keep throwing up (vomiting).   This information is not intended to replace advice given to you by your health care provider. Make sure you discuss any questions you have with your health care provider.   Follow up with your primary care provider for re-evaluation and medication adjustment. Discontinue taking Cymbalta. Return to the emergency department if you experience severe worsening of your symptoms, loss of consciousness, severe headache, blurry vision, weakness, fever.

## 2015-06-29 ENCOUNTER — Telehealth: Payer: Self-pay | Admitting: Family Medicine

## 2015-06-29 NOTE — Telephone Encounter (Signed)
Pt calling and states that he was recently at the ER because of an allergic reaction to Cymbalta. Patient would like to talk to provider as he blood sugar is also dropping. Thank you, Dorothey BasemanSadie Reynolds, ASA

## 2015-06-29 NOTE — Telephone Encounter (Signed)
Pt is returning our call. He said he was talking to a nurse and his phone died. jw

## 2015-06-29 NOTE — Telephone Encounter (Signed)
Spoke with patient briefly, before we got disconnected.  Was wondering if he should stop taking his glimepiride since his "sugars are dropping, and they told me I had hyponatremia".  Advised him that hyponatremia was low sodium, not glucose.  This is when I suddenly got a busy tone, attempted to call back but same response.   Will await callback for more info. Boubacar Lerette, Maryjo RochesterJessica Dawn

## 2015-06-29 NOTE — Telephone Encounter (Signed)
Please attempt to recall patient in the am.  Please let him know that we will recheck a BMP when he sees me next Friday for his physical.  For now, continue blood pressure and diabetes medications as we discussed at our last visit.

## 2015-06-30 NOTE — Telephone Encounter (Signed)
LMOVM for pt to return call .Fleeger, Jessica Dawn  

## 2015-06-30 NOTE — Telephone Encounter (Signed)
Message given to pt

## 2015-07-10 ENCOUNTER — Encounter: Payer: Self-pay | Admitting: Family Medicine

## 2015-07-10 ENCOUNTER — Ambulatory Visit (INDEPENDENT_AMBULATORY_CARE_PROVIDER_SITE_OTHER): Payer: Medicare Other | Admitting: Family Medicine

## 2015-07-10 VITALS — BP 149/94 | HR 80 | Temp 98.7°F | Ht 75.0 in | Wt 274.0 lb

## 2015-07-10 DIAGNOSIS — L304 Erythema intertrigo: Secondary | ICD-10-CM

## 2015-07-10 DIAGNOSIS — Z23 Encounter for immunization: Secondary | ICD-10-CM

## 2015-07-10 DIAGNOSIS — R6889 Other general symptoms and signs: Secondary | ICD-10-CM

## 2015-07-10 DIAGNOSIS — Z1159 Encounter for screening for other viral diseases: Secondary | ICD-10-CM

## 2015-07-10 DIAGNOSIS — E119 Type 2 diabetes mellitus without complications: Secondary | ICD-10-CM

## 2015-07-10 DIAGNOSIS — N183 Chronic kidney disease, stage 3 unspecified: Secondary | ICD-10-CM

## 2015-07-10 DIAGNOSIS — K5909 Other constipation: Secondary | ICD-10-CM | POA: Diagnosis not present

## 2015-07-10 DIAGNOSIS — I10 Essential (primary) hypertension: Secondary | ICD-10-CM

## 2015-07-10 DIAGNOSIS — K5900 Constipation, unspecified: Secondary | ICD-10-CM | POA: Insufficient documentation

## 2015-07-10 DIAGNOSIS — Z0001 Encounter for general adult medical examination with abnormal findings: Secondary | ICD-10-CM | POA: Diagnosis not present

## 2015-07-10 LAB — BASIC METABOLIC PANEL
BUN: 30 mg/dL — ABNORMAL HIGH (ref 7–25)
CHLORIDE: 100 mmol/L (ref 98–110)
CO2: 28 mmol/L (ref 20–31)
Calcium: 9.5 mg/dL (ref 8.6–10.3)
Creat: 1.76 mg/dL — ABNORMAL HIGH (ref 0.70–1.25)
GLUCOSE: 145 mg/dL — AB (ref 65–99)
POTASSIUM: 3.8 mmol/L (ref 3.5–5.3)
SODIUM: 136 mmol/L (ref 135–146)

## 2015-07-10 MED ORDER — BENAZEPRIL HCL 40 MG PO TABS: 40.0000 mg | ORAL_TABLET | Freq: Every day | ORAL | Status: DC

## 2015-07-10 MED ORDER — CLOTRIMAZOLE 1 % EX CREA: 1.0000 "application " | TOPICAL_CREAM | Freq: Two times a day (BID) | CUTANEOUS | Status: AC

## 2015-07-10 MED ORDER — POLYETHYLENE GLYCOL 3350 17 GM/SCOOP PO POWD: 17.0000 g | Freq: Two times a day (BID) | ORAL | Status: AC | PRN

## 2015-07-10 MED ORDER — TETANUS-DIPHTH-ACELL PERTUSSIS 5-2.5-18.5 LF-MCG/0.5 IM SUSP: 0.5000 mL | Freq: Once | INTRAMUSCULAR | Status: DC

## 2015-07-10 NOTE — Progress Notes (Signed)
Patient ID: Chris Vaughn, male   DOB: Jan 22, 1956, 59 y.o.   MRN: [id number]   Chris Manuelnthony T Fatima is a 59 y.o. male presents to office today for annual physical exam examination.  Concerns today include:  Last eye exam: >1 year Last dental exam: >1 year Last colonoscopy: never had one Immunizations needed: TDap, PNA Refills needed today: cholesterol med  Past Medical History  Diagnosis Date  . Hypertension   . Depression   . Diabetes mellitus without complication (HCC)   . Stroke (HCC)   . Hyperlipidemia    Social History   Social History  . Marital Status: Married    Spouse Name: N/A  . Number of Children: N/A  . Years of Education: N/A   Occupational History  . Not on file.   Social History Main Topics  . Smoking status: Never Smoker   . Smokeless tobacco: Never Used  . Alcohol Use: No  . Drug Use: No  . Sexual Activity: Not on file   Other Topics Concern  . Not on file   Social History Narrative   Past Surgical History  Procedure Laterality Date  . Back surgery     Family History  Problem Relation Age of Onset  . Diabetes Mother   . Heart disease Mother   . Hypertension Father     ROS: Review of Systems Constitutional: negative Eyes: positive for contacts/glasses Ears, nose, mouth, throat, and face: negative Respiratory: negative Cardiovascular: negative Gastrointestinal: positive for constipation Genitourinary:negative Integument/breast: negative Hematologic/lymphatic: negative Musculoskeletal:negative Neurological: negative Behavioral/Psych: positive for anxiety and depression Endocrine: negative Allergic/Immunologic: negative   Physical exam BP 149/94 mmHg  Pulse 80  Temp(Src) 98.7 F (37.1 C) (Oral)  Ht 6\' 3"  (1.905 m)  Wt 274 lb (124.286 kg)  BMI 34.25 kg/m2 General appearance: alert, cooperative, appears stated age and no distress Head: Normocephalic, without obvious abnormality, atraumatic Eyes: negative findings: lids and  lashes normal, conjunctivae and sclerae normal, corneas clear and pupils equal, round, reactive to light and accomodation Ears: normal TM's and external ear canals both ears Nose: Nares normal. Septum midline. Mucosa normal. No drainage or sinus tenderness. Throat: lips, mucosa, and tongue normal; teeth and gums normal Neck: no adenopathy, supple, symmetrical, trachea midline and thyroid not enlarged, symmetric, no tenderness/mass/nodules Back: symmetric, no curvature. ROM normal. No CVA tenderness. Lungs: clear to auscultation bilaterally Chest wall: no tenderness Heart: regular rate and rhythm, S1, S2 normal, no murmur, click, rub or gallop Abdomen: soft, non-tender; bowel sounds normal; no masses,  no organomegaly Extremities: extremities normal, atraumatic, no cyanosis or edema Pulses: 2+ and symmetric Skin: 2 inch area of hypopigmentation and skin breakdown under L axilla.  No surrounding erythema or intduration.  no bleeding or exudate Lymph nodes: Cervical, supraclavicular, and axillary nodes normal. Neurologic: Alert and oriented X 3, normal strength and tone. Normal symmetric reflexes. Normal coordination and gait    Assessment/ Plan: Chris ManuelAnthony T Parker here for annual physical exam.   1. Encounter for general adult medical examination with abnormal findings. - Pneumonia vaccine updated today - Patient to schedule colonoscopy - Family history, medical history and surgical history reviewed and updated  2. HTN, goal below 130/80.  Elevated today.  Patient did not take his BP medications this morning in anticipation for PE with fasting labs. - Basic metabolic panel - benazepril (LOTENSIN) 40 MG tablet; Take 1 tablet (40 mg total) by mouth daily.  Dispense: 90 tablet; Refill: 3  3. Type 2 diabetes mellitus without complication, without  long-term current use of insulin (HCC) - Foot exam performed today.  Moderate fungal infection and thickened toenails.  Monofilament testing wnl. -  Basic metabolic panel  4. CKD (chronic kidney disease) stage 3, GFR 30-59 ml/min - Basic metabolic panel  5. Need for hepatitis C screening test - Hepatitis C antibody screen  6. Other constipation - Continue fiber - polyethylene glycol powder (GLYCOLAX/MIRALAX) powder; Take 17 g by mouth 2 (two) times daily as needed.  Dispense: 3350 g; Refill: 1  7. Intertrigo - clotrimazole (LOTRIMIN) 1 % cream; Apply 1 application topically 2 (two) times daily.  Dispense: 30 g; Refill: 0 - Follow up if no improvement/ worsening  8. Need for vaccine - Pneumococcal polysaccharide vaccine 23-valent greater than or equal to 2yo subcutaneous/IM - TDap rx sent to pharmacy for administration   Fleetwood Pierron M. Nadine Counts, DO PGY-2, [facility]

## 2015-07-10 NOTE — Patient Instructions (Addendum)
Colonoscopy A colonoscopy is an exam to look at your colon. This exam can help find lumps (tumors), growths (polyps), bleeding, and redness and puffiness (inflammation) in your colon.  BEFORE THE PROCEDURE  Ask your doctor about changing or stopping your regular medicines.  You may need to drink a large amount of a special liquid (oral bowel prep). You start drinking this the day before your procedure. It will cause you to have watery poop (stool). This cleans out your colon.  Do not eat or drink anything else once you have started the bowel prep, unless your doctor tells you it is safe to do so.  Make plans for someone to drive you home after the procedure. PROCEDURE  You will be given medicine to help you relax (sedative).  You will lie on your side with your knees bent.  A tube with a camera on the end is put in the opening of your butt (anus) and into your colon. Pictures are sent to a computer screen. Your doctor will look for anything that is not normal.  Your doctor may take a tissue sample (biopsy) from your colon to be looked at more closely.  The exam is finished when your doctor has viewed all of the colon. AFTER THE PROCEDURE  Do not drive for 24 hours after the exam.  You may have a small amount of blood in your poop. This is normal.  You may pass gas and have belly (abdominal) cramps. This is normal.  Ask when your test results will be ready. Make sure you get your test results.   This information is not intended to replace advice given to you by your health care provider. Make sure you discuss any questions you have with your health care provider.   Document Released: 07/30/2010 Document Revised: 07/02/2013 Document Reviewed: 03/04/2013 Elsevier Interactive Patient Education [employer]  Thank you for coming in to clinic today.  1. Your symptoms are consistent with Constipation, likely cause of your General Abdominal Pain / Cramping. 2. Start with  Miralax sent to pharmacy. First dose 68g (4 capfuls) in 32oz water over 1 to 2 hours for clean out. Next day start 17g or 1 capful daily, may adjust dose up or down by half a capful every few days. Recommend to take this medicine daily for next 1-2 weeks, then may need to use it longer if needed. - Goal is to have soft regular bowel movement 1-3x daily, if too runny or diarrhea, then reduce dose of the medicine  Improve water intake, hydration will help Also recommend increased vegetables, fruits, fiber intake Can try daily Metamucil or Fiber supplement at pharmacy over the counter  Follow-up if symptoms are not improving with bowel movements, or if pain worsens, develop fevers, nausea, vomiting.  If you have any other questions or concerns, please feel free to call the clinic to contact me. You may also schedule an earlier appointment if necessary.  However, if your symptoms get significantly worse, please go to the Emergency Department to seek immediate medical attention.  Intertrigo Intertrigo is a skin irritation (inflammation) that happens in warm, moist areas of the body. It happens mostly between folds of skin or where skin rubs together. HOME CARE  Keep the affected area cool and dry.  Leave the skin folds open to air.  Put cotton or linen between the folds of skin.  Avoid tight clothing.  Wear open-toed shoes or sandals.  Use powder on the affected area as told  by your doctor.  Only use medicated creams or pastes as told by your doctor. GET HELP RIGHT AWAY IF:  The rash does not get better after 1 week of treatment.  The rash gets worse.  You have a fever or chills. MAKE SURE YOU:  Understand these instructions.  Will watch your condition.  Will get help right away if you are not doing well or get worse.   This information is not intended to replace advice given to you by your health care provider. Make sure you discuss any questions you have with your health  care provider.   Document Released: 07/30/2010 Document Revised: 09/19/2011 Document Reviewed: 12/29/2014 Elsevier Interactive Patient Education [employer]

## 2015-07-11 LAB — HEPATITIS C ANTIBODY: HCV Ab: NEGATIVE

## 2015-07-12 ENCOUNTER — Telehealth: Payer: Self-pay | Admitting: Family Medicine

## 2015-07-12 ENCOUNTER — Encounter: Payer: Self-pay | Admitting: Family Medicine

## 2015-07-12 DIAGNOSIS — N183 Chronic kidney disease, stage 3 unspecified: Secondary | ICD-10-CM

## 2015-07-12 NOTE — Telephone Encounter (Signed)
Called to discuss lab results.  Cr better but continues to be elevated.  Looks like Dr Paulina FusiHess has started w/u for this patient's renal disease.  Renal ultrasound never obtained.  Tried to call patient to discuss this but no answer.  I presume this was not performed because patient was previously uninsured.  Will plan to refer to renal for additional studies/ evaluation.  Will recommend that patient has renal ultrasound performed prior to seeing them.  Chris Vaughn M. Nadine CountsGottschalk, DO PGY-2, [facility]

## 2015-07-14 DIAGNOSIS — F331 Major depressive disorder, recurrent, moderate: Secondary | ICD-10-CM | POA: Diagnosis not present

## 2015-07-14 DIAGNOSIS — F332 Major depressive disorder, recurrent severe without psychotic features: Secondary | ICD-10-CM | POA: Diagnosis not present

## 2015-07-15 ENCOUNTER — Telehealth: Payer: Self-pay | Admitting: Family Medicine

## 2015-07-15 NOTE — Telephone Encounter (Signed)
Has questions about benazepril and HCTZ.Chris Vaughn. Feeling lightheaded and dizzy.  Please advise

## 2015-07-16 ENCOUNTER — Encounter: Payer: Self-pay | Admitting: Family Medicine

## 2015-07-16 ENCOUNTER — Ambulatory Visit (INDEPENDENT_AMBULATORY_CARE_PROVIDER_SITE_OTHER): Payer: Medicare Other | Admitting: Family Medicine

## 2015-07-16 VITALS — BP 141/94 | HR 101 | Temp 99.1°F | Ht 75.0 in | Wt 268.0 lb

## 2015-07-16 DIAGNOSIS — F411 Generalized anxiety disorder: Secondary | ICD-10-CM

## 2015-07-16 DIAGNOSIS — R42 Dizziness and giddiness: Secondary | ICD-10-CM | POA: Diagnosis not present

## 2015-07-16 LAB — GLUCOSE, CAPILLARY: Glucose-Capillary: 120 mg/dL — ABNORMAL HIGH (ref 65–99)

## 2015-07-16 NOTE — Progress Notes (Signed)
   Subjective: ON:[id number]:Dizziness KGM:[id number]:Chris Vaughn is a 60 y.o. male presenting to clinic today for same day appointment. PCP: Delynn FlavinAshly Reigan Tolliver, DO Concerns today include:  Dizziness/ Anxiety Patient reports that dizziness began around Christmas.  He notes that dizziness occurs when he stands up.  He notes that he has not lost consciousness.  Denies vertigo.  He reports that he has been sitting down to help with it.  He has been keeping an eye on his blood glucose and has had no lows recently.  He is compliant with his BP medications.  Notes that dizziness seems to be related to multiple changes of [facility]an[facility] medications [facility].  He was recently started on Doxepin.  He notes dry mouth with this.  He has taken it one time so far.  Endorses continued anxiety, depressive symptoms. No nausea, vomiting, diarrhea, double vision, loss of consciousness or falls.  Social History Reviewed: non smoker. MedHx reviewed.   Health Maintenance: colonoscopy due  ROS: Per HPI  Objective: Office vital signs reviewed. BP 141/94 mmHg  Pulse 101  Temp(Src) 99.1 F (37.3 C) (Oral)  Ht 6\' 3"  (1.905 m)  Wt 268 lb (121.564 kg)  BMI 33.50 kg/m2  Physical Examination:  General: Awake, alert, well nourished, somewhat anxious appearing during the exam Cardio: slightly tachycardic, +2 radial pulses Pulm: normal work of breathing, no wheeze MSK: Normal gait and station Neuro: follows commands Psych: flat affect, mood stable, speech normal  OVS: Lying 135/87, HR 98 Sitting 139/90, HR 102 Standing 127/93, HR 114 (dizzy) Standing x3 mins 143/96, HR 111 (dizzy)  Results for orders placed or performed in visit on 07/16/15 (from the past 48 hour(s))  Glucose, capillary     Status: Abnormal   Collection Time: 07/16/15  2:32 PM  Result Value Ref Range   Glucose-Capillary 120 (H) 65 - 99 mg/dL   Assessment/ Plan: 60 y.o. male presenting with  1. Dizziness.  No drop to officially  diagnose orthostatic hypotension but patient symptomatic.  POCT glucose was 120, so does not fit with hypoglycemia.  Suspect that dizziness is related to anxiety. - Orthostatic Vitals reviewed - Glucose (CBG) reviewed - Recommended slow transitions from seated to standing positions, especially after voiding/ waking up from sleep - Encourage PO hydration - Return precautions reviewed with patient - If continues to be dizzy, may consider referral to Vestibular PT for assessment and treatment.  2. Generalized anxiety disorder.  [facility].  He has on multiple occasions revealed mistrust.  I doubt that he will be compliant with any psych medications without a good foundation with his psychiatric providers.  His gait is normal.  No falls on ambulation.   - Patient provided with a list of psychiatric providers at today's appt - Recommended that he continue Doxepin for now, as he has only taken one dose.  Patient says that he will give this a try - Return precautions discussed. - Patient accompanied by his wife to today's appointment, so think he is stable to go home since she will be driving.  Raliegh IpAshly M Natividad Schlosser, DO PGY-2, [facility]

## 2015-07-16 NOTE — Telephone Encounter (Signed)
Pt informed and scheduled Sadie Reynolds, ASA

## 2015-07-16 NOTE — Telephone Encounter (Signed)
LM for pt to call our office. If he calls, please give him the information below. Sunday SpillersSharon T Saunders, CMA

## 2015-07-16 NOTE — Patient Instructions (Signed)
Your orthostatic vital signs were negative.  However, I recommend that you take time to get up slowly from seated positions.  Continue to hydrate well.  Consider obtaining compression stockings.  Continue taking your blood pressure medications and anxiety medications.  Orthostatic Hypotension Orthostatic hypotension is a sudden drop in blood pressure. It happens when you quickly stand up from a seated or lying position. You may feel dizzy or light-headed. This can last for just a few seconds or for up to a few minutes. It is usually not a serious problem. However, if this happens frequently or gets worse, it can be a sign of something more serious. CAUSES  Different things can cause orthostatic hypotension, including:   Loss of body fluids (dehydration).  Medicines that lower blood pressure.  Sudden changes in posture, such as standing up quickly after you have been sitting or lying down.  Taking too much of your medicine. SIGNS AND SYMPTOMS   Light-headedness or dizziness.   Fainting or near-fainting.   A fast heart rate.   Weakness.   Feeling tired (fatigue).  DIAGNOSIS  Your health care provider may do several things to help diagnose your condition and identify the cause. These may include:   Taking a medical history and doing a physical exam.  Checking your blood pressure. Your health care provider will check your blood pressure when you are:  Lying down.  Sitting.  Standing.  Using tilt table testing. In this test, you lie down on a table that moves from a lying position to a standing position. You will be strapped onto the table. This test monitors your blood pressure and heart rate when you are in different positions. TREATMENT  Treatment will vary depending on the cause. Possible treatments include:   Changing the dosage of your medicines.  Wearing compression stockings on your lower legs.  Standing up slowly after sitting or lying down.  Eating more  salt.  Eating frequent, small meals.  In some cases, getting IV fluids.  Taking medicine to enhance fluid retention. HOME CARE INSTRUCTIONS  Only take over-the-counter or prescription medicines as directed by your health care provider.  Follow your health care provider's instructions for changing the dosage of your current medicines.  Do not stop or adjust your medicine on your own.  Stand up slowly after sitting or lying down. This allows your body to adjust to the different position.  Wear compression stockings as directed.  Eat extra salt as directed.  Do not add extra salt to your diet unless directed to by your health care provider.  Eat frequent, small meals.  Avoid standing suddenly after eating.  Avoid hot showers or excessive heat as directed by your health care provider.  Keep all follow-up appointments. SEEK MEDICAL CARE IF:  You continue to feel dizzy or light-headed after standing.  You feel groggy or confused.  You feel cold, clammy, or sick to your stomach (nauseous).  You have blurred vision.  You feel short of breath. SEEK IMMEDIATE MEDICAL CARE IF:   You faint after standing.  You have chest pain.  You have difficulty breathing.   You lose feeling or movement in your arms or legs.   You have slurred speech or difficulty talking, or you are unable to talk.  MAKE SURE YOU:   Understand these instructions.  Will watch your condition.  Will get help right away if you are not doing well or get worse.   This information is not intended to replace advice  given to you by your health care provider. Make sure you discuss any questions you have with your health care provider.   Document Released: 06/17/2002 Document Revised: 07/02/2013 Document Reviewed: 04/19/2013 Elsevier Interactive Patient Education [employer]

## 2015-07-16 NOTE — Telephone Encounter (Signed)
Please have him check his glucose and see if he can schedule a SDA for BP evaluation if light headed.  BP elevated at last appt.

## 2015-07-17 ENCOUNTER — Ambulatory Visit (HOSPITAL_COMMUNITY)
Admission: RE | Admit: 2015-07-17 | Discharge: 2015-07-17 | Disposition: A | Discharge status: Home / Self Care | Payer: Medicare Other | Source: Ambulatory Visit | Attending: Family Medicine | Admitting: Family Medicine

## 2015-07-17 ENCOUNTER — Telehealth: Payer: Self-pay | Admitting: Family Medicine

## 2015-07-17 ENCOUNTER — Telehealth: Payer: Self-pay | Admitting: *Deleted

## 2015-07-17 DIAGNOSIS — N183 Chronic kidney disease, stage 3 unspecified: Secondary | ICD-10-CM

## 2015-07-17 DIAGNOSIS — N281 Cyst of kidney, acquired: Secondary | ICD-10-CM | POA: Insufficient documentation

## 2015-07-17 DIAGNOSIS — R160 Hepatomegaly, not elsewhere classified: Secondary | ICD-10-CM

## 2015-07-17 NOTE — Telephone Encounter (Signed)
Contacted pt per Dr. Nadine CountsGottschalk to inform him that she had ordered an MRI and I had contacted him to see about a good time to schedule.  I then contacted him with appointment information when he informed me that he didn't like small spaces so I conferred with Garen Gramssha Benton and she stated that [facility] has an open MRI and I called to schedule with them and they informed me to have pt call them so they could assess his needs for the MRI and they would get that scheduled.  I then contacted pt and gave him the number to [facility] to call and schedule his appointment. l told pt if he had any problems then he could call us back. Lamonte SakaiZimmerman Rumple, April D, [place]

## 2015-07-17 NOTE — Telephone Encounter (Signed)
Discussed recent renal ultrasound with patient, 2 cysts noted.  Have already messaged referral coordinator to have this information sent over to [facility] to see if this changes the urgency of his appt.  Additionally, an incidental mass seen on liver.  Will order MRI w/ and w/o contrast to evaluate further.  May need additional studies pending the MRI result.  Patient voices good understanding and wishes to proceed with MRI.   [facility]  07/17/2015  CLINICAL DATA:  Chronic kidney disease stage 3. Hypertension and diabetes. EXAM: RENAL / URINARY TRACT ULTRASOUND COMPLETE COMPARISON:  None. FINDINGS: Right Kidney: Length: 11.6 cm. Negative for obstruction. 10 mm hyperechoic focus in the lower pole may represent a stone. This shows mild shadowing. Left Kidney: Length: 11.2 cm. Left lower pole cyst 1.9 x 2.6 cm. Slightly complex upper pole cyst 1.8 x 1.8 cm. Negative for obstruction. Bladder: Appears normal for degree of bladder distention. Incidental note is made of a large hyperechoic lesion in the right lobe liver. This is incompletely imaged but measures approximately 7 x 9 cm. Further imaging evaluation is suggested. IMPRESSION: Normal renal size without hydronephrosis. Probable 10 mm nonobstructing stone right lower pole. Multiple left renal cysts. Incidental liver lesion deserves further evaluation. This may represent a large hemangioma or other mass lesion. Further evaluation with pre and post contrast MRI or CT should be considered. MRI is preferred in younger patients (due to lack of ionizing radiation) and for evaluating calcified lesion(s). Electronically Signed   By: Marlan Palauharles  Clark M.D.   On: 07/17/2015 09:05    Ashly M. Nadine CountsGottschalk, DO PGY-2, [facility]

## 2015-07-23 DIAGNOSIS — F332 Major depressive disorder, recurrent severe without psychotic features: Secondary | ICD-10-CM | POA: Diagnosis not present

## 2015-07-24 ENCOUNTER — Telehealth: Payer: Self-pay | Admitting: Family Medicine

## 2015-07-24 DIAGNOSIS — H40013 Open angle with borderline findings, low risk, bilateral: Secondary | ICD-10-CM | POA: Diagnosis not present

## 2015-07-24 DIAGNOSIS — H2513 Age-related nuclear cataract, bilateral: Secondary | ICD-10-CM | POA: Diagnosis not present

## 2015-07-24 DIAGNOSIS — E119 Type 2 diabetes mellitus without complications: Secondary | ICD-10-CM | POA: Diagnosis not present

## 2015-07-24 LAB — HM DIABETES EYE EXAM

## 2015-07-24 NOTE — Telephone Encounter (Signed)
[facility] called and need the last visit notes of the patient and also why the referral is needed. Please fax all this to [phone]. jw

## 2015-07-24 NOTE — Telephone Encounter (Signed)
Done

## 2015-07-24 NOTE — Telephone Encounter (Signed)
Will forward to referral coordinator.  Referral was sent on 07/14/15. Jazmin Hartsell,CMA

## 2015-07-29 ENCOUNTER — Ambulatory Visit
Admission: RE | Admit: 2015-07-29 | Discharge: 2015-07-29 | Disposition: A | Discharge status: Home / Self Care | Payer: Medicare Other | Source: Ambulatory Visit | Attending: Family Medicine | Admitting: Family Medicine

## 2015-07-29 DIAGNOSIS — R160 Hepatomegaly, not elsewhere classified: Secondary | ICD-10-CM

## 2015-08-05 ENCOUNTER — Ambulatory Visit (HOSPITAL_COMMUNITY): Payer: Medicare Other

## 2015-08-07 DIAGNOSIS — Z79899 Other long term (current) drug therapy: Secondary | ICD-10-CM | POA: Diagnosis not present

## 2015-08-07 DIAGNOSIS — F330 Major depressive disorder, recurrent, mild: Secondary | ICD-10-CM | POA: Diagnosis not present

## 2015-08-17 ENCOUNTER — Encounter: Payer: Self-pay | Admitting: Family Medicine

## 2015-08-17 ENCOUNTER — Ambulatory Visit (INDEPENDENT_AMBULATORY_CARE_PROVIDER_SITE_OTHER): Payer: Medicare Other | Admitting: Family Medicine

## 2015-08-17 VITALS — BP 131/82 | HR 89 | Temp 99.0°F | Ht 75.0 in | Wt 273.6 lb

## 2015-08-17 DIAGNOSIS — E119 Type 2 diabetes mellitus without complications: Secondary | ICD-10-CM | POA: Diagnosis not present

## 2015-08-17 DIAGNOSIS — R160 Hepatomegaly, not elsewhere classified: Secondary | ICD-10-CM

## 2015-08-17 DIAGNOSIS — F4323 Adjustment disorder with mixed anxiety and depressed mood: Secondary | ICD-10-CM

## 2015-08-17 DIAGNOSIS — H409 Unspecified glaucoma: Secondary | ICD-10-CM | POA: Insufficient documentation

## 2015-08-17 MED ORDER — LORAZEPAM 0.5 MG PO TABS: 0.5000 mg | ORAL_TABLET | Freq: Three times a day (TID) | ORAL | Status: DC | PRN

## 2015-08-17 MED ORDER — VILAZODONE HCL 10 & 20 MG PO KIT: 10.0000 mg | PACK | Freq: Every day | ORAL | Status: DC

## 2015-08-17 MED ORDER — TIMOLOL HEMIHYDRATE 0.5 % OP SOLN: 1.0000 [drp] | Freq: Every day | OPHTHALMIC | Status: DC

## 2015-08-17 NOTE — Patient Instructions (Signed)
As we discussed, MRI would better evaluate the lesion on your liver.  CT does not visualize this as well.  I have placed the order for the MRI again.  You may receive a call to schedule this.  Think it over.  You make take 2 tablets of your Lorazepam 30 minutes before your imaging study.  This will help reduce your anxiety.  Please call me with questions.

## 2015-08-17 NOTE — Progress Notes (Signed)
    Subjective: CC: DM2, depression/anxiety HPI: Chris Vaughn is a 60 y.o. male presenting to clinic today for office visit. Concerns today include:  1. Glaucoma Patient saw eye doctor last month.  Was started on Timolol eye drops for glaucoma.  He notes that these seem to be helping.  Has intermittent blurriness.  No ocular pain.   2. Depression/ Anxiety Patient was seen on [facility]), Dr Chris Vaughn.  The Doxepin was discontinued and he was started on Viibryd.  He was given a sample pack.  Reports some flushing with the medication.  He reports that he has been somewhat drowsy on the new medication.  Continues to take small doses of Ativan. He thinks that the medication seems to make him hungry.  He notes that anxiety is well controlled on the medication.  3. Diabetes:  High at home: 400 Low at home: 90 Taking medications: Amaryl  ROS: denies fever, chills, dizziness, LOC, polyuria, polydipsia, numbness or tingling in extremities or chest pain. Last eye exam: 07/2015 Last foot exam: 06/2015 Last A1c: 6.3 06/2015 Nephropathy screen indicated?: On Benazepril Last flu, zoster and/or pneumovax: UTD  4. Liver Mass He notes that went for the MRI but that he had a panic attack during the exam.  Does not feel that he can do the MRI.  Did not take Ativan before procedure.  Would rather pursue a CT because he is claustrophobic.  Denies abdominal pain, jaundice.  Social History Reviewed: non smoker. FamHx and MedHx reviewed.  Please see EMR. Health Maintenance: Colonoscopy due.  ROS: Per HPI  Objective: Office vital signs reviewed. BP 131/82 mmHg  Pulse 89  Temp(Src) 99 F (37.2 C) (Oral)  Ht  (1.905 m)  Wt 273 lb 9.6 oz (124.104 kg)  BMI 34.20 kg/m2  Physical Examination:  General: Awake, alert, well nourished, No acute distress HEENT: Normal, MMM Cardio: regular rate and rhythm, S1S2 heard, no murmurs appreciated Pulm: clear to auscultation  bilaterally, no wheezes, rhonchi or rales, normal work of breathing on RA GI: protuberant, soft, non-tender, non-distended, bowel sounds present x4, no hepatomegaly MSK: Normal gait and station Psych: good eye contact, mood stable, no SI/HI.  Assessment/ Plan: 60 y.o. male   1. Type 2 diabetes mellitus without complication, without long-term current use of insulin (HCC) - A1c due in March - Foot exam done 06/2015.  Updated EMR to reflect this - Continue Amaryl  daily.  Patient to monitor BG more closely.  If having BG >400 he will need to increase Amaryl.  2. Adjustment disorder with mixed anxiety and depressed mood - Pharmacy to review new medication side effects with patient - Continue care with Dr Chris Vaughn as scheduled  3. Liver mass.  Discussed with patient that CT is not ideal to view solid organs.  He is to consider MRI. - Take  Lorazepam 30 minutes before imaging study - MR Liver W Wo Contrast; Future  4. Glaucoma. Newly diagnosed.  No ocular pain. - Continue Timolol as directed by optho.  Follow up in March for DM2.  [name], DO PGY-2, [facility]

## 2015-08-21 ENCOUNTER — Other Ambulatory Visit: Payer: Self-pay | Admitting: Family Medicine

## 2015-08-21 ENCOUNTER — Telehealth: Payer: Self-pay | Admitting: Family Medicine

## 2015-08-21 DIAGNOSIS — I10 Essential (primary) hypertension: Secondary | ICD-10-CM

## 2015-08-21 MED ORDER — AMLODIPINE BESYLATE 10 MG PO TABS: 10.0000 mg | ORAL_TABLET | Freq: Every day | ORAL | Status: DC

## 2015-08-21 NOTE — Telephone Encounter (Signed)
Pt called and needs a refill on his Amlodipine called in. jw  °

## 2015-08-21 NOTE — Telephone Encounter (Signed)
Done

## 2015-08-26 NOTE — Telephone Encounter (Signed)
Pt informed. Shivali Quackenbush T Raniya Golembeski, CMA  

## 2015-08-31 DIAGNOSIS — H11153 Pinguecula, bilateral: Secondary | ICD-10-CM | POA: Diagnosis not present

## 2015-08-31 DIAGNOSIS — H2513 Age-related nuclear cataract, bilateral: Secondary | ICD-10-CM | POA: Diagnosis not present

## 2015-08-31 DIAGNOSIS — H40013 Open angle with borderline findings, low risk, bilateral: Secondary | ICD-10-CM | POA: Diagnosis not present

## 2015-09-01 DIAGNOSIS — Z79899 Other long term (current) drug therapy: Secondary | ICD-10-CM | POA: Diagnosis not present

## 2015-09-01 DIAGNOSIS — F330 Major depressive disorder, recurrent, mild: Secondary | ICD-10-CM | POA: Diagnosis not present

## 2015-09-02 ENCOUNTER — Ambulatory Visit (INDEPENDENT_AMBULATORY_CARE_PROVIDER_SITE_OTHER): Payer: Medicare Other | Admitting: Family Medicine

## 2015-09-02 ENCOUNTER — Encounter: Payer: Self-pay | Admitting: Family Medicine

## 2015-09-02 VITALS — BP 164/100 | HR 101 | Temp 98.1°F | Wt 278.1 lb

## 2015-09-02 DIAGNOSIS — K5909 Other constipation: Secondary | ICD-10-CM | POA: Diagnosis not present

## 2015-09-02 DIAGNOSIS — K625 Hemorrhage of anus and rectum: Secondary | ICD-10-CM

## 2015-09-02 LAB — HEMOCCULT GUIAC POC 1CARD (OFFICE): FECAL OCCULT BLD: NEGATIVE

## 2015-09-02 NOTE — Patient Instructions (Addendum)
SCHEDULE A COLONOSCOPY!  A list of gastroenterologists has been provided to you.  You may take Miralax as below.  This will no be affected by your kidney function.  Thank you for coming in to clinic today.  1. Your symptoms are consistent with Constipation, likely cause of your General Abdominal Pain / Cramping. 2. Start with Miralax sent to pharmacy. First dose 68g (4 capfuls) in 32oz water over 1 to 2 hours for clean out. Next day start 17g or 1 capful daily, may adjust dose up or down by half a capful every few days. Recommend to take this medicine daily for next 1-2 weeks, then may need to use it longer if needed. - Goal is to have soft regular bowel movement 1-3x daily, if too runny or diarrhea, then reduce dose of the medicine  Improve water intake, hydration will help Also recommend increased vegetables, fruits, fiber intake Can try daily Metamucil or Fiber supplement at pharmacy over the counter  Follow-up if symptoms are not improving with bowel movements, or if pain worsens, develop fevers, nausea, vomiting.  Please schedule a follow-up appointment with Dr Nadine Counts in 1 month to follow-up Constipation  If you have any other questions or concerns, please feel free to call the clinic to contact me. You may also schedule an earlier appointment if necessary.  However, if your symptoms get significantly worse, please go to the Emergency Department to seek immediate medical attention.

## 2015-09-02 NOTE — Progress Notes (Signed)
   Subjective: CC: rectal bleeding ZOX:WRUEAVW T Chris Vaughn is a 60 y.o. male presenting to clinic today for same day appointment. PCP: Delynn Flavin, DO Concerns today include:  Rectal bleeding Patient reports that he saw blood on his tissue after wiping yesterday.  He notes that he has been constipated for the last 4-5 days.  He notes that he had a small bowel movement yesterday.  Denies rectal itching.  Endorses straining and some discomfort after defecation.  He was seen for rectal bleeding in 2015 by Dr Paulina Fusi and was told to have a colonoscopy at that time.  He has not done this.  No nausea, vomiting, fevers, melena.  No known family history colon cancer, ovarian or uterine cancers.  Social History Reviewed: non smoker. FamHx and MedHx reviewed.  Please see EMR.  ROS: Per HPI  Objective: Office vital signs reviewed. BP 164/100 mmHg  Pulse 101  Temp(Src) 98.1 F (36.7 C) (Oral)  Wt 278 lb 1.6 oz (126.145 kg)  Physical Examination:  General: Awake, alert, well nourished, No acute distress HEENT: Normal GI: soft, non-tender, non-distended, bowel sounds present x4, no hepatomegaly, no splenomegaly Rectal: no fissures or external hemorrhoids appreciated. Rectal tone normal, no palpable abnormalities within the rectal vault.  No frank bleeding on exam.  Anoscopy performed.  Possible internal hemorrhoid appreciated at the 6 o'clock position.  Nonbleeding.  Assessment/ Plan: 60 y.o. male   1. Rectal bleeding. FOBT negative today in office.  Never had a colonoscopy.  Again, stressed the importance of having one performed as we cannot r/o malignancy. - Hemoccult - 1 Card (office) - List of GI providers/ colonoscopy information provided - Return precautions reviewed.  2. Other constipation - Miralax prescribed at last appt.   - Gave clean out instructions provided again today.   - Follow up in 1 month for constipation.  Patient did not take BP medication today.  BP elevated but no  red flag signs.  Advised that he should take this medication when he gets home.  Patient voiced understanding.  Procedure note: Patient provided verbal consent for anoscopic evaluation.  DRE was performed and we then proceeded with anoscopy.  Anoscope was lubricated and gently inserted into the rectum about 2 inches.  Patient's anus was examined while slowly withdrawing scope.  No active bleeds or tears appreciated.  Possible internal hemorrhoid as above.  Patient tolerated procedure well.  There were no immediate complications.  [name], DO PGY-2, [facility]

## 2015-09-09 ENCOUNTER — Other Ambulatory Visit: Payer: Self-pay | Admitting: Student

## 2015-09-10 ENCOUNTER — Other Ambulatory Visit: Payer: Self-pay | Admitting: Family Medicine

## 2015-09-11 NOTE — Telephone Encounter (Signed)
This is your patient, see med refill 

## 2015-09-15 ENCOUNTER — Other Ambulatory Visit: Payer: Self-pay | Admitting: Family Medicine

## 2015-09-15 DIAGNOSIS — I10 Essential (primary) hypertension: Secondary | ICD-10-CM

## 2015-09-15 MED ORDER — HYDROCHLOROTHIAZIDE 25 MG PO TABS: 25.0000 mg | ORAL_TABLET | Freq: Every day | ORAL | Status: DC

## 2015-10-05 ENCOUNTER — Ambulatory Visit (INDEPENDENT_AMBULATORY_CARE_PROVIDER_SITE_OTHER): Payer: Medicare Other | Admitting: Family Medicine

## 2015-10-05 ENCOUNTER — Encounter: Payer: Self-pay | Admitting: Family Medicine

## 2015-10-05 VITALS — BP 135/84 | HR 90 | Temp 98.6°F | Ht 75.0 in | Wt 280.2 lb

## 2015-10-05 DIAGNOSIS — L0291 Cutaneous abscess, unspecified: Secondary | ICD-10-CM | POA: Diagnosis not present

## 2015-10-05 DIAGNOSIS — I10 Essential (primary) hypertension: Secondary | ICD-10-CM | POA: Diagnosis not present

## 2015-10-05 DIAGNOSIS — E119 Type 2 diabetes mellitus without complications: Secondary | ICD-10-CM

## 2015-10-05 LAB — POCT GLYCOSYLATED HEMOGLOBIN (HGB A1C): Hemoglobin A1C: 7.1

## 2015-10-05 MED ORDER — GLIPIZIDE 5 MG PO TABS: 2.5000 mg | ORAL_TABLET | Freq: Every day | ORAL | Status: DC

## 2015-10-05 MED ORDER — DOXYCYCLINE MONOHYDRATE 100 MG PO CAPS
100.0000 mg | ORAL_CAPSULE | Freq: Two times a day (BID) | ORAL | Status: DC
Start: 2015-10-05 — End: 2016-05-26

## 2015-10-05 MED ORDER — MINOCYCLINE HCL 100 MG PO CAPS: 100.0000 mg | ORAL_CAPSULE | Freq: Two times a day (BID) | ORAL | Status: DC

## 2015-10-05 NOTE — Progress Notes (Signed)
    Subjective    Chris Vaughn is a 60 y.o. male that presents for a follow-up visit for:   1. Bruise in left shoulder: He first noticed a bruise four weeks ago. Symptoms started with pain and soreness in the area. He states that the area appeared swollen but has improved. He sometimes has soreness at night. He has been using rubbing alcohol to keep the area clean along with an ice pack to help with pain, which works. No associated fevers, or vomiting, but does endorse some nausea. He reports a history of lipomas that were removed at least 8 years ago.  2. Hypertension: Patient adherent with benazepril 40mg , hydrochlorothiazide 25mg  daily, amlodipine 10mg  and carvedilol 12.5mg  BID. No chest pain or lower extremity edema. He reports some dyspnea when doing activities around the house. He reports having a chemical stress test two years ago that was normal.  3. Diabetes mellitus: Patient currently on glimepiride 2mg  daily. He is adherent with regimen. He does not check fasting blood sugars. He checks random blood sugars that range from 80s to 120s. No hypoglycemic symptoms. No polyphagia, polydipsia or polyuria.   Social History  Substance Use Topics  . Smoking status: Never Smoker   . Smokeless tobacco: Never Used  . Alcohol Use: No    Allergies  Allergen Reactions  . Cymbalta [Duloxetine Hcl] Other (See Comments)    Disoriented, bad feelings, doesn't seem to work for patient    No orders of the defined types were placed in this encounter.    ROS  Per HPI   Objective   BP 135/84 mmHg  Pulse 90  Temp(Src) 98.6 F (37 C) (Oral)  Ht 6\' 3"  (1.905 m)  Wt 280 lb 3.2 oz (127.098 kg)  BMI 35.02 kg/m2  Vital signs reviewed  General: Well appearing, no distress Skin: Left shoulder with small area of induration that is non-tender with overlying erythema. No drainage  Assessment and Plan    Type 2 diabetes mellitus without complication, without long-term current use of insulin  (HCC) Patient with an elevated hemoglobin A1C to 7.1 today. Initially on glimepiride 1mg  daily. He reports low blood sugars to the 80s. He is not, however, checking fasting blood sugars. Will switch him to glipizide as this may be better since it is short acting. Metformin was considered, however, patient's kidney function is diminished so will not start. Discussed other oral therapies, however, decided to not pursue those at this time.  HTN, goal below 130/80 Patient with a history of angina. No chest pain. Blood pressure at goal. No changes to regimen   Abscess Return precautions discussed. No systemic symptoms. - doxycycline (MONODOX) 100 MG capsule; Take 1 capsule (100 mg total) by mouth 2 (two) times daily.  Dispense: 20 capsule; Refill: 0

## 2015-10-05 NOTE — Assessment & Plan Note (Signed)
Patient with a history of angina. No chest pain. Blood pressure at goal. No changes to regimen

## 2015-10-05 NOTE — Assessment & Plan Note (Signed)
Patient with an elevated hemoglobin A1C to 7.1 today. Initially on glimepiride 1mg  daily. He reports low blood sugars to the 80s. He is not, however, checking fasting blood sugars. Will switch him to glipizide as this may be better since it is short acting. Metformin was considered, however, patient's kidney function is diminished so will not start. Discussed other oral therapies, however, decided to not pursue those at this time.

## 2015-10-05 NOTE — Patient Instructions (Signed)
Thank you for coming to see me today. It was a pleasure. Today we talked about:   Diabetes: I am switching you to Glipizide 2.5mg  daily and then I want you to increase to 5mg  daily if your blood sugars are not too low. I am discontinuing the glimepiride.   Hypertension: No changes  Abscess: please use warm compresses. You Tylenol as needed. I am giving you an antibiotic for 10 days. Please apply a warm compress as well to help healing.  Please make an appointment to see Dr. Nadine CountsGottschalk in 1-3 months.  If you have any questions or concerns, please do not hesitate to call the office at [phone].  Sincerely,  Jacquelin Hawkingalph Teanna Elem, MD

## 2015-10-07 DIAGNOSIS — E1122 Type 2 diabetes mellitus with diabetic chronic kidney disease: Secondary | ICD-10-CM | POA: Diagnosis not present

## 2015-10-07 DIAGNOSIS — N183 Chronic kidney disease, stage 3 (moderate): Secondary | ICD-10-CM | POA: Diagnosis not present

## 2015-10-07 DIAGNOSIS — E785 Hyperlipidemia, unspecified: Secondary | ICD-10-CM | POA: Diagnosis not present

## 2015-10-07 DIAGNOSIS — N2581 Secondary hyperparathyroidism of renal origin: Secondary | ICD-10-CM | POA: Diagnosis not present

## 2015-10-08 DIAGNOSIS — H40013 Open angle with borderline findings, low risk, bilateral: Secondary | ICD-10-CM | POA: Diagnosis not present

## 2015-10-08 DIAGNOSIS — H11153 Pinguecula, bilateral: Secondary | ICD-10-CM | POA: Diagnosis not present

## 2015-10-08 DIAGNOSIS — H2513 Age-related nuclear cataract, bilateral: Secondary | ICD-10-CM | POA: Diagnosis not present

## 2015-11-03 ENCOUNTER — Encounter: Payer: Self-pay | Admitting: Family Medicine

## 2015-11-03 ENCOUNTER — Ambulatory Visit (INDEPENDENT_AMBULATORY_CARE_PROVIDER_SITE_OTHER): Payer: Medicare Other | Admitting: Family Medicine

## 2015-11-03 VITALS — BP 128/88 | HR 85 | Temp 98.4°F | Ht 75.0 in | Wt 283.8 lb

## 2015-11-03 DIAGNOSIS — N183 Chronic kidney disease, stage 3 unspecified: Secondary | ICD-10-CM

## 2015-11-03 DIAGNOSIS — B353 Tinea pedis: Secondary | ICD-10-CM | POA: Diagnosis not present

## 2015-11-03 DIAGNOSIS — E119 Type 2 diabetes mellitus without complications: Secondary | ICD-10-CM

## 2015-11-03 MED ORDER — ATORVASTATIN CALCIUM 40 MG PO TABS: 20.0000 mg | ORAL_TABLET | Freq: Every day | ORAL | Status: DC

## 2015-11-03 NOTE — Progress Notes (Signed)
    Subjective: CC: DM2 HPI: Chris Vaughn is a 60 y.o. male presenting to clinic today for follow up appt. Concerns today include:  1. Diabetes:  High at home: 175 Low at home: 1877 Taking medications: Amaryl was discontinued at last appt.  Tolerating glipizide fairly well.  Reports occ nausea.  Occ exertional SOB. ROS: denies fever, chills, dizziness, CP Last eye exam: Dr Dione BoozeGroat 07/2015.  Next appt 11/2015. Last foot exam: 08/2015 Last A1c: 7.1 Nephropathy screen indicated?: on ACE-I, [facility].   Last flu, zoster and/or pneumovax: Needs Zostavax  2. Abscess: Resolved after abx.  Did not need I&D.  No recurrence.  3. Fungal foot infection Has been using Lotrimin cream daily.  Notices a small difference but not a huge difference.  No foot ulcers but see callous formation.  4. CKD 3 Followed by [facility].  Notes that he was instructed to cut Lipitor down to 20mg .  NO other medication changes were made.  He has follow up labs scheduled soon, including UA.  Reports good UOP. No fluid or dietary restrictions per patient.  No nausea, vomiting, LE edema.  Social History Reviewed: non smoker. FamHx and MedHx reviewed.  Please see EMR. Health Maintenance: Colonoscopy  ROS: Per HPI  Objective: Office vital signs reviewed. BP 128/88 mmHg  Pulse 85  Temp(Src) 98.4 F (36.9 C) (Oral)  Ht 6\' 3"  (1.905 m)  Wt 283 lb 12.8 oz (128.731 kg)  BMI 35.47 kg/m2  Physical Examination:  General: Awake, alert, well nourished, No acute distress HEENT: Normal, MMM Cardio: regular rate and rhythm, S1S2 heard, no murmurs appreciated Pulm: clear to auscultation bilaterally, no wheezes, rhonchi or rales, normal WOB on room air Extremities: warm, moderate thickening of toenails.  callouses present.  No ulceration. MSK: Normal gait and station  Assessment/ Plan: 60 y.o. male   1. Type 2 diabetes mellitus without complication, without long-term current use of insulin  (HCC).  Doing well on Glipizide 5mg . No hypoglycemic episodes.  Last A1c reviewed 7.1. - A1c at next visit or in June  - No ulcerations on exam. - Ambulatory referral to Podiatry - Continue Glipizide 5mg .  If needed can increase to 10mg  at next A1c check. - Continue diet modification/ exercise daily  2. Tinea pedis of both feet - Continue Lotrimin, keeping feet clean and dry. - Ambulatory referral to Podiatry  3. CKD (chronic kidney disease) stage 3, GFR 30-59 ml/min - Follow up with [facility] as scheduled - Will defer urinalysis/ microalbumin to them - Updated Lipitor to reflect recommended dose per renal. - Continue to avoid nephrotoxic agents. - On ACE-I and diuretic   [name], DO PGY-2, [facility]

## 2015-11-05 DIAGNOSIS — N183 Chronic kidney disease, stage 3 (moderate): Secondary | ICD-10-CM | POA: Diagnosis not present

## 2015-11-09 DIAGNOSIS — Z79899 Other long term (current) drug therapy: Secondary | ICD-10-CM | POA: Diagnosis not present

## 2015-11-09 DIAGNOSIS — F330 Major depressive disorder, recurrent, mild: Secondary | ICD-10-CM | POA: Diagnosis not present

## 2015-11-16 ENCOUNTER — Telehealth: Payer: Self-pay | Admitting: *Deleted

## 2015-11-16 NOTE — Telephone Encounter (Signed)
yes

## 2015-11-16 NOTE — Telephone Encounter (Signed)
Pt states Chris Vaughn kidney told him to stop taking his Lipitor for a month. Wants to know if that ok. Morissa Obeirne Bruna PotterBlount, CMA

## 2015-11-16 NOTE — Telephone Encounter (Signed)
Pt informed. Deseree Blount, CMA  

## 2015-11-20 DIAGNOSIS — H11153 Pinguecula, bilateral: Secondary | ICD-10-CM | POA: Diagnosis not present

## 2015-11-20 DIAGNOSIS — H2513 Age-related nuclear cataract, bilateral: Secondary | ICD-10-CM | POA: Diagnosis not present

## 2015-11-20 DIAGNOSIS — H40013 Open angle with borderline findings, low risk, bilateral: Secondary | ICD-10-CM | POA: Diagnosis not present

## 2015-11-23 ENCOUNTER — Encounter: Payer: Self-pay | Admitting: Podiatry

## 2015-11-23 ENCOUNTER — Ambulatory Visit (INDEPENDENT_AMBULATORY_CARE_PROVIDER_SITE_OTHER): Payer: Medicare Other | Admitting: Podiatry

## 2015-11-23 VITALS — BP 138/88 | HR 78 | Resp 18

## 2015-11-23 DIAGNOSIS — L84 Corns and callosities: Secondary | ICD-10-CM

## 2015-11-23 DIAGNOSIS — M79675 Pain in left toe(s): Secondary | ICD-10-CM | POA: Diagnosis not present

## 2015-11-23 DIAGNOSIS — M79674 Pain in right toe(s): Secondary | ICD-10-CM

## 2015-11-23 DIAGNOSIS — B351 Tinea unguium: Secondary | ICD-10-CM

## 2015-11-23 NOTE — Progress Notes (Signed)
   Subjective:    Patient ID: Chris Vaughn, male    DOB: January 18, 1956, 60 y.o.   MRN: [id number]  HPI  60 year old male presents the office they for concerns of thick, painful, elongated tenderness that he cannot trim himself as well as for painful calluses to both of his feet. Denies any redness or drainage along the sites. His last A1c was 7.1. Denies any tingling or numbness. No claudication symptoms. No recent injury or trauma. No other complaints.    Review of Systems  All other systems reviewed and are negative.      Objective:   Physical Exam General: AAO x3, NAD  Dermatological: Nails are hypertrophic, dystrophic, brittle, discolored, elongated 10. No surrounding redness or drainage. Tenderness nails 1-5 bilaterally. Bilateral submetatarsal 3 hyperkeratotic lesions. Upon debridement no underlying ulceration, drainage or other signs of infection. No open lesions or  Vascular: Dorsalis Pedis artery and Posterior Tibial artery pedal pulses are 2/4 bilateral with immedate capillary fill time. Pedal hair growth present. No varicosities and no lower extremity edema present bilateral. There is no pain with calf compression, swelling, warmth, erythema.   Neruologic: Grossly intact via light touch bilateral. Vibratory intact via tuning fork bilateral. Protective threshold with Semmes Wienstein monofilament intact to all pedal sites bilateral. Patellar and Achilles deep tendon reflexes 2+ bilateral. No Babinski or clonus noted bilateral.   Musculoskeletal: No gross boney pedal deformities bilateral. No pain, crepitus, or limitation noted with foot and ankle range of motion bilateral. Muscular strength 5/5 in all groups tested bilateral.  Gait: Unassisted, Nonantalgic.       Assessment & Plan:  Symptomatic onychomycosis, hyperkeratotic lesions -Treatment options discussed including all alternatives, risks, and complications -Etiology of symptoms were discussed -Nails are brittle  10 without complications or bleeding -Hyperkeratotic lesions debrided 2 without occasions or bleeding. -Discussed daily foot inspection. -Follow-up in 3 months or sooner if any problems arise. In the meantime, encouraged to call the office with any questions, concerns, change in symptoms.   Ovid CurdMatthew Wagoner, DPM

## 2015-11-23 NOTE — Patient Instructions (Signed)
Diabetes and Foot Care Diabetes may cause you to have problems because of poor blood supply (circulation) to your feet and legs. This may cause the skin on your feet to become thinner, break easier, and heal more slowly. Your skin may become dry, and the skin may peel and crack. You may also have nerve damage in your legs and feet causing decreased feeling in them. You may not notice minor injuries to your feet that could lead to infections or more serious problems. Taking care of your feet is one of the most important things you can do for yourself.  HOME CARE INSTRUCTIONS  Wear shoes at all times, even in the house. Do not go barefoot. Bare feet are easily injured.  Check your feet daily for blisters, cuts, and redness. If you cannot see the bottom of your feet, use a mirror or ask someone for help.  Wash your feet with warm water (do not use hot water) and mild soap. Then pat your feet and the areas between your toes until they are completely dry. Do not soak your feet as this can dry your skin.  Apply a moisturizing lotion or petroleum jelly (that does not contain alcohol and is unscented) to the skin on your feet and to dry, brittle toenails. Do not apply lotion between your toes.  Trim your toenails straight across. Do not dig under them or around the cuticle. File the edges of your nails with an emery board or nail file.  Do not cut corns or calluses or try to remove them with medicine.  Wear clean socks or stockings every day. Make sure they are not too tight. Do not wear knee-high stockings since they may decrease blood flow to your legs.  Wear shoes that fit properly and have enough cushioning. To break in new shoes, wear them for just a few hours a day. This prevents you from injuring your feet. Always look in your shoes before you put them on to be sure there are no objects inside.  Do not cross your legs. This may decrease the blood flow to your feet.  If you find a minor scrape,  cut, or break in the skin on your feet, keep it and the skin around it clean and dry. These areas may be cleansed with mild soap and water. Do not cleanse the area with peroxide, alcohol, or iodine.  When you remove an adhesive bandage, be sure not to damage the skin around it.  If you have a wound, look at it several times a day to make sure it is healing.  Do not use heating pads or hot water bottles. They may burn your skin. If you have lost feeling in your feet or legs, you may not know it is happening until it is too late.  Make sure your health care provider performs a complete foot exam at least annually or more often if you have foot problems. Report any cuts, sores, or bruises to your health care provider immediately. SEEK MEDICAL CARE IF:   You have an injury that is not healing.  You have cuts or breaks in the skin.  You have an ingrown nail.  You notice redness on your legs or feet.  You feel burning or tingling in your legs or feet.  You have pain or cramps in your legs and feet.  Your legs or feet are numb.  Your feet always feel cold. SEEK IMMEDIATE MEDICAL CARE IF:   There is increasing redness,   swelling, or pain in or around a wound.  There is a red line that goes up your leg.  Pus is coming from a wound.  You develop a fever or as directed by your health care provider.  You notice a bad smell coming from an ulcer or wound.   This information is not intended to replace advice given to you by your health care provider. Make sure you discuss any questions you have with your health care provider.   Document Released: 06/24/2000 Document Revised: 02/27/2013 Document Reviewed: 12/04/2012 Elsevier Interactive Patient Education 2016 Elsevier Inc.  

## 2015-11-25 ENCOUNTER — Encounter: Payer: Self-pay | Admitting: Podiatry

## 2015-12-14 DIAGNOSIS — E785 Hyperlipidemia, unspecified: Secondary | ICD-10-CM | POA: Diagnosis not present

## 2016-01-04 ENCOUNTER — Ambulatory Visit (INDEPENDENT_AMBULATORY_CARE_PROVIDER_SITE_OTHER): Payer: Medicare Other | Admitting: Family Medicine

## 2016-01-04 ENCOUNTER — Encounter: Payer: Self-pay | Admitting: Family Medicine

## 2016-01-04 VITALS — BP 142/88 | HR 83 | Temp 98.7°F | Ht 75.0 in | Wt 288.6 lb

## 2016-01-04 DIAGNOSIS — R160 Hepatomegaly, not elsewhere classified: Secondary | ICD-10-CM

## 2016-01-04 DIAGNOSIS — E119 Type 2 diabetes mellitus without complications: Secondary | ICD-10-CM | POA: Diagnosis not present

## 2016-01-04 LAB — POCT GLYCOSYLATED HEMOGLOBIN (HGB A1C): Hemoglobin A1C: 7.7

## 2016-01-04 MED ORDER — GLIPIZIDE 5 MG PO TABS: 5.0000 mg | ORAL_TABLET | Freq: Every day | ORAL | Status: DC

## 2016-01-04 NOTE — Patient Instructions (Addendum)
If you feel dizzy, check your blood sugar.  If it is <60.  Skip the glipizide.  Otherwise, take it daily.  We are increasing your dose to 1 tablet (5mg ) daily.  We will plan to recheck your sugar in September.    Additionally, I encourage you to reconsider getting the MRI of your liver done.  As we discussed before, we could be missing a malignancy, meaning a cancer that could be life threatening.

## 2016-01-04 NOTE — Progress Notes (Signed)
    Subjective: CC: DM2 HPI: Chris Vaughn is a 60 y.o. male presenting to clinic today for follow up. Concerns today include:  1. Diabetes:  High at home: 240s Low at home: 90-100 Taking medications: Glipizide 2.5mg .  Notes that he occ misses doses of Glipizide.   Side effects: occ dizziness ROS: Endorses: polyuria, polydipsia.  Denies fever, chills, LOC, numbness or tingling in extremities or chest pain. Last eye exam: 07/2015, no retinopathy Last foot exam: 08/2015 Last A1c: 7.1 (09/2015) Nephropathy screen indicated?: on ACE-I Last flu, zoster and/or pneumovax: UTD  2. Liver lesion Found incidentally on renal u/s.  Patient not amenable to MRI, citing that he does not feel comfortable going into machine.  He has been offered anti anxiety medication but still does not wish to pursue imaging.  No abdominal pain, nausea, vomiting.  Social History Reviewed: non smoker. FamHx and MedHx reviewed.  Please see EMR. Health Maintenance: colonoscopy due  ROS: Per HPI  Objective: Office vital signs reviewed. BP 151/86 mmHg  Pulse 83  Temp(Src) 98.7 F (37.1 C) (Oral)  Ht 6\' 3"  (1.905 m)  Wt 288 lb 9.6 oz (130.908 kg)  BMI 36.07 kg/m2  Physical Examination:  General: Awake, alert, well nourished, No acute distress HEENT: Normal, sclera white/ anicteric Cardio: regular rate and rhythm, S1S2 heard Pulm: clear to auscultation bilaterally, no wheezes, rhonchi or rales, normal WOB on room air  Results for orders placed or performed in visit on 01/04/16 (from the past 24 hour(s))  HgB A1c     Status: None   Collection Time: 01/04/16  2:40 PM  Result Value Ref Range   Hemoglobin A1C 7.7     Assessment/ Plan: 60 y.o. male   1. Type 2 diabetes mellitus without complication, without long-term current use of insulin (HCC).  A1c worse than previous at 7.7 today. - Reviewed A1c correlation with average BG.  Handout provided for reference. - Recommended that patient check BG if  feeling dizzy or sweaty. - Glipizide increased to 5mg  daily today. - HgB A1c - glipiZIDE (GLUCOTROL) 5 MG tablet; Take 1 tablet (5 mg total) by mouth daily before breakfast.  Dispense: 30 tablet; Refill: 3  2. Liver mass - Patient still not amenable to MRI of liver - Discussed possible implications of not investigating lesion, including death/ malignancy.  Please see AVS - Will continue to offer imaging study.   Raliegh IpAshly M Kierria Feigenbaum, DO PGY-2, [facility]

## 2016-01-22 DIAGNOSIS — N183 Chronic kidney disease, stage 3 (moderate): Secondary | ICD-10-CM | POA: Diagnosis not present

## 2016-02-06 ENCOUNTER — Encounter (HOSPITAL_COMMUNITY): Payer: Self-pay | Admitting: Emergency Medicine

## 2016-02-06 ENCOUNTER — Emergency Department (HOSPITAL_COMMUNITY)
Admission: EM | Admit: 2016-02-06 | Discharge: 2016-02-06 | Disposition: A | Discharge status: Home / Self Care | Payer: Medicare Other | Attending: Emergency Medicine | Admitting: Emergency Medicine

## 2016-02-06 ENCOUNTER — Emergency Department (HOSPITAL_COMMUNITY): Payer: Medicare Other

## 2016-02-06 DIAGNOSIS — R079 Chest pain, unspecified: Secondary | ICD-10-CM

## 2016-02-06 DIAGNOSIS — Y939 Activity, unspecified: Secondary | ICD-10-CM | POA: Diagnosis not present

## 2016-02-06 DIAGNOSIS — X58XXXA Exposure to other specified factors, initial encounter: Secondary | ICD-10-CM | POA: Insufficient documentation

## 2016-02-06 DIAGNOSIS — Y999 Unspecified external cause status: Secondary | ICD-10-CM | POA: Diagnosis not present

## 2016-02-06 DIAGNOSIS — Y929 Unspecified place or not applicable: Secondary | ICD-10-CM | POA: Diagnosis not present

## 2016-02-06 DIAGNOSIS — S199XXA Unspecified injury of neck, initial encounter: Secondary | ICD-10-CM | POA: Diagnosis present

## 2016-02-06 DIAGNOSIS — Z8673 Personal history of transient ischemic attack (TIA), and cerebral infarction without residual deficits: Secondary | ICD-10-CM | POA: Insufficient documentation

## 2016-02-06 DIAGNOSIS — R0789 Other chest pain: Secondary | ICD-10-CM | POA: Insufficient documentation

## 2016-02-06 DIAGNOSIS — E1122 Type 2 diabetes mellitus with diabetic chronic kidney disease: Secondary | ICD-10-CM | POA: Insufficient documentation

## 2016-02-06 DIAGNOSIS — S161XXA Strain of muscle, fascia and tendon at neck level, initial encounter: Secondary | ICD-10-CM | POA: Insufficient documentation

## 2016-02-06 DIAGNOSIS — I129 Hypertensive chronic kidney disease with stage 1 through stage 4 chronic kidney disease, or unspecified chronic kidney disease: Secondary | ICD-10-CM | POA: Diagnosis not present

## 2016-02-06 DIAGNOSIS — N183 Chronic kidney disease, stage 3 (moderate): Secondary | ICD-10-CM | POA: Diagnosis not present

## 2016-02-06 LAB — BASIC METABOLIC PANEL
Anion gap: 9 (ref 5–15)
BUN: 20 mg/dL (ref 6–20)
CALCIUM: 9.6 mg/dL (ref 8.9–10.3)
CO2: 24 mmol/L (ref 22–32)
CREATININE: 1.77 mg/dL — AB (ref 0.61–1.24)
Chloride: 100 mmol/L — ABNORMAL LOW (ref 101–111)
GFR calc Af Amer: 46 mL/min — ABNORMAL LOW (ref >60)
GFR, EST NON AFRICAN AMERICAN: 39 mL/min — AB (ref >60)
GLUCOSE: 147 mg/dL — AB (ref 65–99)
Potassium: 3.7 mmol/L (ref 3.5–5.1)
Sodium: 133 mmol/L — ABNORMAL LOW (ref 135–145)

## 2016-02-06 LAB — CBC
HCT: 46.5 % (ref 39.0–52.0)
Hemoglobin: 15.7 g/dL (ref 13.0–17.0)
MCH: 28 pg (ref 26.0–34.0)
MCHC: 33.8 g/dL (ref 30.0–36.0)
MCV: 83 fL (ref 78.0–100.0)
Platelets: 284 10*3/uL (ref 150–400)
RBC: 5.6 MIL/uL (ref 4.22–5.81)
RDW: 13.5 % (ref 11.5–15.5)
WBC: 4.9 10*3/uL (ref 4.0–10.5)

## 2016-02-06 LAB — I-STAT TROPONIN, ED
TROPONIN I, POC: 0 ng/mL (ref 0.00–0.08)
Troponin i, poc: 0 ng/mL (ref 0.00–0.08)

## 2016-02-06 NOTE — ED Triage Notes (Signed)
Pt presents with c/o chest pain and neck pain; CP started Thu, neck pn 10 days ago; separate from each other.  Pn interrupts sleep.

## 2016-02-06 NOTE — Discharge Instructions (Signed)

## 2016-02-06 NOTE — ED Notes (Signed)
Pt is in stable condition upon d/c and ambulates from ED. 

## 2016-02-06 NOTE — ED Provider Notes (Signed)
Emergency Department Provider Note   I have reviewed the triage vital signs and the nursing notes.   HISTORY  Chief Complaint Chest Pain and Neck Pain   HPI Chris Vaughn is a 60 y.o. male with PMH of DM, HLD, HTN, h/o CVA presents to the ED for evaluation of intermittent chest discomfort for the last 4 days and neck discomfort for the last 10 days. The patient's neck discomfort is primarily in the right side of his neck made worse with turning to the left. He reports a tightness in that area. Denies any vertigo, vision changes, balance problems. He denies any known trauma. He woke up one morning with a stiffness there that is not resolved. He is been taking Tylenol and applying an ice pack occasionally with mild relief in symptoms. He is unable to take Motrin because of chronic kidney disease.  Patient states he's most concerning this chest discomfort which began 4 days ago [place] driving. He reports being under some stress recently. He was driving on the road and had approximately 1 minute of sharp left-sided chest discomfort that was not radiating. Since that time he has intermittently had a dull aching sensation in the same general distribution of his initial sharp pain. Pain lasts for a few seconds and then goes away. The pain is not brought on by exertion, twisting, or lifting. No history of AMI.    Past Medical History:  Diagnosis Date  . Depression   . Diabetes mellitus without complication (HCC)   . Hyperlipidemia   . Hypertension   . Stroke [facility])     Patient Active Problem List   Diagnosis Date Noted  . Liver mass 08/17/2015  . Glaucoma 08/17/2015  . Constipation 07/10/2015  . Angina pectoris (HCC) 04/22/2015  . Chest pain 04/22/2015  . Esophageal reflux   . Essential hypertension, benign   . Type 2 diabetes mellitus without complication, without Tilmon Wisehart-term current use of insulin (HCC)   . Mood disorder of depressed type 11/11/2014  . Rectal bleeding 04/01/2014    . Adjustment disorder with mixed anxiety and depressed mood 11/28/2013  . Hyperlipidemia 11/13/2013  . CKD (chronic kidney disease) stage 3, GFR 30-59 ml/min 11/13/2013  . CVA (cerebral infarction) 10/16/2013  . Mild depression 06/27/2012  . HTN, goal below 130/80 06/27/2012    Past Surgical History:  Procedure Laterality Date  . BACK SURGERY      Current Outpatient Rx  . Order #: [id number] Class: Normal  . Order #: [id number] Class: Normal  . Order #: [id number] Class: No Print  . Order #: [id number] Class: Normal  . Order #: [id number] Class: Normal  . Order #: [id number] Class: Normal  . Order #: [id number] Class: Historical Med  . Order #: [id number] Class: Normal  . Order #: [id number] Class: Normal  . Order #: [id number] Class: Normal  . Order #: [id number] Class: Historical Med  . Order #: [id number] Class: Normal  . Order #: [id number] Class: Normal  . Order #: [id number] Class: No Print  . Order #: [id number] Class: Historical Med    Allergies Cymbalta [duloxetine hcl]  Family History  Problem Relation Age of Onset  . Diabetes Mother   . Heart disease Mother   . Hypertension Father     Social History Social History  Substance Use Topics  . Smoking status: Never Smoker  . Smokeless tobacco: Never Used  . Alcohol use No    Review of Systems  Constitutional: No fever/chills Eyes: No visual changes. ENT: No sore throat. Positive neck pain.  Cardiovascular: Positive chest pain. Respiratory:  Denies shortness of breath. Gastrointestinal: No abdominal pain.  No nausea, no vomiting.  No diarrhea.  No constipation. Genitourinary: Negative for dysuria. Musculoskeletal: Negative for back pain. Skin: Negative for rash. Neurological: Negative for headaches, focal weakness or numbness.  10-point ROS otherwise negative.  ____________________________________________   PHYSICAL EXAM:  VITAL SIGNS: ED Triage Vitals  Enc Vitals Group     BP 02/06/16 1332 (!) 141/104     Pulse Rate  02/06/16 1332 92     Resp 02/06/16 1332 20     Temp 02/06/16 1332 99 F (37.2 C)     Temp Source 02/06/16 1332 Oral     SpO2 02/06/16 1332 93 %     Pain Score 02/06/16 1334 6   Constitutional: Alert and oriented. Well appearing and in no acute distress. Eyes: Conjunctivae are normal. PERRL. EOMI. Head: Atraumatic. Nose: No congestion/rhinnorhea. Mouth/Throat: Mucous membranes are moist.  Oropharynx non-erythematous. Neck: No stridor.  No meningeal signs. No cervical spine tenderness to palpation. Right lateral neck tenderness and muscle tightness extending down to the shoulder.  Cardiovascular: Normal rate, regular rhythm. Good peripheral circulation. Grossly normal heart sounds.   Respiratory: Normal respiratory effort.  No retractions. Lungs CTAB. Gastrointestinal: Soft and nontender. No distention.  Musculoskeletal: No lower extremity tenderness nor edema. No gross deformities of extremities. Neurologic:  Normal speech and language. No gross focal neurologic deficits are appreciated.  Skin:  Skin is warm, dry and intact. No rash noted. Psychiatric: Mood and affect are normal. Speech and behavior are normal.  ____________________________________________   LABS (all labs ordered are listed, but only abnormal results are displayed)  Labs Reviewed  BASIC METABOLIC PANEL - Abnormal; Notable for the following:       Result Value   Sodium 133 (*)    Chloride 100 (*)    Glucose, Bld 147 (*)    Creatinine, Ser 1.77 (*)    GFR calc non Af Amer 39 (*)    GFR calc Af Amer 46 (*)    All other components within normal limits  CBC  I-STAT TROPOININ, ED  I-STAT TROPOININ, ED   ____________________________________________  EKG  Reviewed in MUSE. No STEMI.  ____________________________________________  RADIOLOGY  Dg Chest 2 View  Result Date: 02/06/2016 CLINICAL DATA:  Left-sided chest pain for 3 days. EXAM: CHEST  2 VIEW COMPARISON:  04/21/2015 FINDINGS: Heart and mediastinal  contours are within normal limits. No focal opacities or effusions. No acute bony abnormality. IMPRESSION: No active cardiopulmonary disease. Electronically Signed   By: Charlett Nose M.D.   On: 02/06/2016 14:35   ____________________________________________   PROCEDURES  Procedure(s) performed:   Procedures  None ____________________________________________   INITIAL IMPRESSION / ASSESSMENT AND PLAN / ED COURSE  Pertinent labs & imaging results that were available during my care of the patient were reviewed by me and considered in my medical decision making (see chart for details).  Patient presents to the emergency department for evaluation of 4 days of intermittent chest discomfort and 10 days of right lateral neck discomfort. The patient's neck discomfort appears to be musculoskeletal in etiology. He has muscle tightness extending laterally over his shoulder and upper back. Neurologically intact. Very low suspicion for bony injury or vascular injury. Discussed applying heat, using Tylenol and over-the-counter lidocaine patch as needed. Patient is unable to take Motrin secondary to his chronic kidney disease.  The patient's chest discomfort is somewhat atypical however he has significant medical comorbidities that put him at increased risk of ACS. Last  chest pain was earlier this morning. Currently chest pain-free. Initial troponin negative. Plan for repeat troponin and referral to cardiology for outpatient management and further risk stratification.   06:07 PM Second troponin negative. Patient with no additional chest pain since this morning. Discussed at length my impression and plan for him to follow up with both his primary care physician and a cardiologist for possible provocative testing as an outpatient. He will return to the emergency department if his chest pain returns.  ____________________________________________  FINAL CLINICAL IMPRESSION(S) / ED DIAGNOSES  Final  diagnoses:  Nonspecific chest pain  Cervical strain, acute, initial encounter     MEDICATIONS GIVEN DURING THIS VISIT:  None  NEW OUTPATIENT MEDICATIONS STARTED DURING THIS VISIT:  None   Note:  This document was prepared using Dragon voice recognition software and may include unintentional dictation errors.  Alona Bene, MD Emergency Medicine   Maia Plan, MD 02/06/16 [phone]

## 2016-02-16 DIAGNOSIS — F330 Major depressive disorder, recurrent, mild: Secondary | ICD-10-CM | POA: Diagnosis not present

## 2016-02-16 DIAGNOSIS — Z79899 Other long term (current) drug therapy: Secondary | ICD-10-CM | POA: Diagnosis not present

## 2016-02-26 ENCOUNTER — Ambulatory Visit: Payer: Medicare Other | Admitting: Podiatry

## 2016-03-02 ENCOUNTER — Ambulatory Visit: Payer: Medicare Other | Admitting: Family Medicine

## 2016-03-02 DIAGNOSIS — E1122 Type 2 diabetes mellitus with diabetic chronic kidney disease: Secondary | ICD-10-CM | POA: Diagnosis not present

## 2016-03-02 DIAGNOSIS — N2581 Secondary hyperparathyroidism of renal origin: Secondary | ICD-10-CM | POA: Diagnosis not present

## 2016-03-02 DIAGNOSIS — N183 Chronic kidney disease, stage 3 (moderate): Secondary | ICD-10-CM | POA: Diagnosis not present

## 2016-03-02 DIAGNOSIS — E785 Hyperlipidemia, unspecified: Secondary | ICD-10-CM | POA: Diagnosis not present

## 2016-03-10 ENCOUNTER — Ambulatory Visit (INDEPENDENT_AMBULATORY_CARE_PROVIDER_SITE_OTHER): Payer: Medicare Other | Admitting: Family Medicine

## 2016-03-10 ENCOUNTER — Encounter: Payer: Self-pay | Admitting: Family Medicine

## 2016-03-10 VITALS — BP 144/87 | HR 98 | Temp 98.9°F | Ht 75.0 in | Wt 290.8 lb

## 2016-03-10 DIAGNOSIS — L720 Epidermal cyst: Secondary | ICD-10-CM | POA: Diagnosis not present

## 2016-03-10 DIAGNOSIS — R079 Chest pain, unspecified: Secondary | ICD-10-CM | POA: Diagnosis not present

## 2016-03-10 NOTE — Progress Notes (Signed)
    Subjective: CC: chest pain HPI: Chris Vaughn is a 60 y.o. male presenting to clinic today for office visit. Concerns today include:  1. Chest pain Patient seen in ED about 2 weeks ago for chest pain.  Has a negative ED workup.  He was recommended to follow up with a cardiologist for possible outpatient stress test.  He reports no chest pain since ED visit.  He reports occ indigestion and anxiety.  Denies nausea, vomiting, diaphoresis.  Occasional SOB w/ activity.  No history of heart disease/ MI.  Does have a h/o CVA in 2015.  Patient reports a family h/o cardiac disease (mom and maternal grandma).  No MI that he knows of in his family.  2. ?Boil on back Patient reports that he has a possible boil on his mid lower back that has been present for about 2-3 weeks.  He notes tenderness.  He notes that the texture changes.  Denies exudate or bleeding.  Denies fevers, chills, recent insect bites.  Social History Reviewed: non smoker. FamHx and MedHx reviewed.  Please see EMR. Health Maintenance: flu shot  ROS: Per HPI  Objective: Office vital signs reviewed. BP (!) 144/87   Pulse 98   Temp 98.9 F (37.2 C) (Oral)   Ht 6\' 3"  (1.905 m)   Wt 290 lb 12.8 oz (131.9 kg)   BMI 36.35 kg/m   Physical Examination:  General: Awake, alert, well nourished, No acute distress Cardio: regular rate and rhythm, S1S2 heard, no murmurs appreciated Pulm: clear to auscultation bilaterally, no wheezes, rhonchi or rales Skin: dry, intact, 2 cm x 2cm well circumscribed rounded mass consistent with a cyst along the left thoracolumbar paraspinal area.  Area is mildly tender. No fluctuance/ induration/ increased heat/ erythema.  A punctum appreciated at the base.  Lesion is nonbleeding. No exudate able to be expressed.  Assessment/ Plan: 60 y.o. male   1. Chest pain, unspecified chest pain type. Resolved.  I wonder how much his anxiety plays a role.  He does have risk factors for cardiac disease  including HLD, DM2 and HTN.  Patient would benefit from stress test. - Return precautions reviewed - Ambulatory referral to Cardiology - Continue statin at 1/2 dose per renal. - Will plan to repeat CK at next visit.  2. Epidermal inclusion cyst - reassurance - Discussed referral for removal. - Patient declines at this time  Follow up as scheduled for routine care.   Raliegh IpAshly M Wynn Alldredge, DO PGY-3, [facility]

## 2016-03-10 NOTE — Patient Instructions (Signed)
I have placed a referral to cardiology for you for stress testing.  You should receive a call for an appointment soon.  I think that the spot on your back is a cyst.  It is benign.  We can refer you to dermatology for removal if it bothers you in the future.  Epidermal Cyst An epidermal cyst is sometimes called a sebaceous cyst, epidermal inclusion cyst, or infundibular cyst. These cysts usually contain a substance that looks "pasty" or "cheesy" and may have a bad smell. This substance is a protein called keratin. Epidermal cysts are usually found on the face, neck, or trunk. They may also occur in the vaginal area or other parts of the genitalia of both men and women. Epidermal cysts are usually small, painless, slow-growing bumps or lumps that move freely under the skin. It is important not to try to pop them. This may cause an infection and lead to tenderness and swelling. CAUSES  Epidermal cysts may be caused by a deep penetrating injury to the skin or a plugged hair follicle, often associated with acne. SYMPTOMS  Epidermal cysts can become inflamed and cause:  Redness.  Tenderness.  Increased temperature of the skin over the bumps or lumps.  Grayish-white, bad smelling material that drains from the bump or lump. DIAGNOSIS  Epidermal cysts are easily diagnosed by your caregiver during an exam. Rarely, a tissue sample (biopsy) may be taken to rule out other conditions that may resemble epidermal cysts. TREATMENT   Epidermal cysts often get better and disappear on their own. They are rarely ever cancerous.  If a cyst becomes infected, it may become inflamed and tender. This may require opening and draining the cyst. Treatment with antibiotics may be necessary. When the infection is gone, the cyst may be removed with minor surgery.  Small, inflamed cysts can often be treated with antibiotics or by injecting steroid medicines.  Sometimes, epidermal cysts become large and bothersome. If this  happens, surgical removal in your caregiver's office may be necessary. HOME CARE INSTRUCTIONS  Only take over-the-counter or prescription medicines as directed by your caregiver.  Take your antibiotics as directed. Finish them even if you start to feel better. SEEK MEDICAL CARE IF:   Your cyst becomes tender, red, or swollen.  Your condition is not improving or is getting worse.  You have any other questions or concerns. MAKE SURE YOU:  Understand these instructions.  Will watch your condition.  Will get help right away if you are not doing well or get worse.   This information is not intended to replace advice given to you by your health care provider. Make sure you discuss any questions you have with your health care provider.   Document Released: 05/28/2004 Document Revised: 09/19/2011 Document Reviewed: 01/03/2011 Elsevier Interactive Patient Education [employer] Elsevier Inc.

## 2016-03-11 ENCOUNTER — Other Ambulatory Visit: Payer: Self-pay | Admitting: Family Medicine

## 2016-03-31 ENCOUNTER — Encounter: Payer: Self-pay | Admitting: Internal Medicine

## 2016-04-01 ENCOUNTER — Other Ambulatory Visit: Payer: Self-pay | Admitting: Family Medicine

## 2016-04-01 ENCOUNTER — Telehealth: Payer: Self-pay | Admitting: *Deleted

## 2016-04-01 MED ORDER — GLUCOSE BLOOD VI STRP: ORAL_STRIP | 12 refills | Status: AC

## 2016-04-01 NOTE — Telephone Encounter (Signed)
done

## 2016-04-01 NOTE — Telephone Encounter (Signed)
Pt informed. Deseree Blount, CMA  

## 2016-04-01 NOTE — Telephone Encounter (Signed)
Pt calling in wanting some test strips sent in for his one touch ultra mini meter. Deseree Bruna PotterBlount, CMA

## 2016-04-15 ENCOUNTER — Ambulatory Visit: Payer: Medicare Other | Admitting: Internal Medicine

## 2016-05-09 ENCOUNTER — Other Ambulatory Visit: Payer: Self-pay | Admitting: *Deleted

## 2016-05-09 DIAGNOSIS — E119 Type 2 diabetes mellitus without complications: Secondary | ICD-10-CM

## 2016-05-09 MED ORDER — GLIPIZIDE 5 MG PO TABS: 5.0000 mg | ORAL_TABLET | Freq: Every day | ORAL | 12 refills | Status: AC

## 2016-05-09 NOTE — Telephone Encounter (Signed)
Patient's insurance requires a 90 day supply.  Martin, Tamika L, RN  

## 2016-05-10 ENCOUNTER — Other Ambulatory Visit: Payer: Self-pay | Admitting: *Deleted

## 2016-05-10 MED ORDER — ATORVASTATIN CALCIUM 40 MG PO TABS: 20.0000 mg | ORAL_TABLET | Freq: Every day | ORAL | 11 refills | Status: DC

## 2016-05-10 NOTE — Telephone Encounter (Signed)
Patients is requiring a 90 day supply of atorvastatin.  Please send in new Rx for Atorvastatin 20 mg.  Clovis PuMartin, Sean Macwilliams L, RN

## 2016-05-11 ENCOUNTER — Encounter: Payer: Self-pay | Admitting: *Deleted

## 2016-05-14 ENCOUNTER — Other Ambulatory Visit: Payer: Self-pay | Admitting: Student

## 2016-05-14 ENCOUNTER — Other Ambulatory Visit: Payer: Self-pay | Admitting: Family Medicine

## 2016-05-14 DIAGNOSIS — I10 Essential (primary) hypertension: Secondary | ICD-10-CM

## 2016-05-16 NOTE — Telephone Encounter (Signed)
Already refilled 10/31 for 1 year

## 2016-05-23 DIAGNOSIS — Z79899 Other long term (current) drug therapy: Secondary | ICD-10-CM | POA: Diagnosis not present

## 2016-05-23 DIAGNOSIS — F330 Major depressive disorder, recurrent, mild: Secondary | ICD-10-CM | POA: Diagnosis not present

## 2016-05-26 ENCOUNTER — Ambulatory Visit (INDEPENDENT_AMBULATORY_CARE_PROVIDER_SITE_OTHER): Payer: Medicare Other | Admitting: Internal Medicine

## 2016-05-26 ENCOUNTER — Encounter: Payer: Self-pay | Admitting: Internal Medicine

## 2016-05-26 VITALS — BP 132/96 | HR 76 | Ht 75.0 in | Wt 296.8 lb

## 2016-05-26 DIAGNOSIS — I10 Essential (primary) hypertension: Secondary | ICD-10-CM

## 2016-05-26 DIAGNOSIS — E785 Hyperlipidemia, unspecified: Secondary | ICD-10-CM | POA: Diagnosis not present

## 2016-05-26 DIAGNOSIS — R079 Chest pain, unspecified: Secondary | ICD-10-CM | POA: Diagnosis not present

## 2016-05-26 LAB — LIPID PANEL
Cholesterol: 142 mg/dL (ref <200)
HDL: 43 mg/dL (ref >40)
LDL CALC: 66 mg/dL (ref <100)
Total CHOL/HDL Ratio: 3.3 Ratio (ref <5.0)
Triglycerides: 165 mg/dL — ABNORMAL HIGH (ref <150)
VLDL: 33 mg/dL — AB (ref <30)

## 2016-05-26 LAB — LDL CHOLESTEROL, DIRECT: LDL DIRECT: 71 mg/dL (ref <130)

## 2016-05-26 LAB — ALT: ALT: 20 U/L (ref 9–46)

## 2016-05-26 MED ORDER — CARVEDILOL 25 MG PO TABS: 25.0000 mg | ORAL_TABLET | Freq: Two times a day (BID) | ORAL | 1 refills | Status: AC

## 2016-05-26 NOTE — Progress Notes (Signed)
New Outpatient Visit Date: 05/26/2016  Referring Provider: [name], DO [address] [place], [place] [address]  Chief Complaint: Chest pain  HPI:  Mr. Mcgaugh is a 60 y.o. year-old male with history of CVA (2015), type 2 diabetes mellitus, hypertension hyperlipidemia, and depression, who has been referred by Dr. Nadine Counts for evaluation of chest pain. The patient reports an episode of sharp, left-sided chest pain in 01/2016. It occurred while he was driving and lasted "a few minutes." Its maximal intensity was 7-8/10 and was without radiation. The pain resolved on its own without intervention. Mr. Vanderwall denies associated symptoms including shortness of breath, nausea, diaphoresis, and lightheadedness. He has had sporadic similar episodes both before and after July, though they have never been this severe. The most recent episode was this morning and lasted only a couple of minutes. He has attributed this to anxiety and indigestion. The patient is not particularly active due to generalized weakness following his prior stroke. He has not had any exertional pain when doing chores around the house but at times gets out of breath.  The patient endorses occasional orthostatic lightheadedness, which has resolved since he has been more careful about getting up too quickly. He denies orthopnea, PND, and palpitations.  Mr. Cratty denies a history of heart disease. He believes he underwent a cardiac stress test more than 10 years ago, which he states was normal. He was advised to cut atorvastatin and half a few months ago due to elevated muscle enzymes. He also stopped taking aspirin recently after developing ringing in his years. The ringing has since stopped, though he remains off aspirin.  --------------------------------------------------------------------------------------------------  Cardiovascular History & Procedures: Cardiovascular Problems:  Chest pain  Stroke  Risk  Factors:  Stroke, DM, hypertension, hyperlipidemia, male gender, and age >39  Cath/PCI:  None  CV Surgery:  None  EP Procedures and Devices:  None  Non-Invasive Evaluation(s):  Transthoracic echocardiogram (10/17/13): Normal LV size with mild LVH. LVEF 60-65% with normal wall motion. Mild left atrial enlargement. Normal RV size and function. No significant valvular abnormalities.  Carotid Doppler (10/16/13): Mild stenosis 1-39% narrowing involving the left and right internal carotid arteries. Right vertebral artery with atypical but antegrade flow. Left vertebral artery was normal antegrade flow.  Recent CV Pertinent Labs: Lab Results  Component Value Date   CHOL 140 04/22/2015   HDL 43 04/22/2015   LDLCALC 64 04/22/2015   LDLDIRECT 132 (H) 09/24/2013   TRIG 165 (H) 04/22/2015   CHOLHDL 3.3 04/22/2015   INR 0.99 10/16/2013   BNP 9.6 09/24/2014   K 3.7 02/06/2016   MG 2.0 02/19/2014   BUN 20 02/06/2016   CREATININE 1.77 (H) 02/06/2016   CREATININE 1.76 (H) 07/10/2015    --------------------------------------------------------------------------------------------------  Past Medical History:  Diagnosis Date  . Depression   . Diabetes mellitus without complication (HCC)   . Hyperlipidemia   . Hypertension   . Stroke [facility])     Past Surgical History:  Procedure Laterality Date  . BACK SURGERY      Outpatient Encounter Prescriptions as of 05/26/2016  Medication Sig  . amLODipine (NORVASC) 10 MG tablet Take 1 tablet (10 mg total) by mouth daily.  Marland Kitchen aspirin EC 81 MG EC tablet Take 1 tablet (81 mg total) by mouth daily.  Marland Kitchen atorvastatin (LIPITOR) 40 MG tablet Take 0.5 tablets (20 mg total) by mouth daily at 6 PM.  . benazepril (LOTENSIN) 40 MG tablet TAKE ONE TABLET BY MOUTH ONCE DAILY  .  carvedilol (COREG) 12.5 MG tablet TAKE ONE TABLET BY MOUTH TWICE DAILY WITH MEALS  . clotrimazole (LOTRIMIN) 1 % cream Apply 1 application topically 2 (two) times daily.  . diazepam  (VALIUM) 2 MG tablet Take 1 tablet by mouth 3 (three) times daily.  Marland Kitchen. doxycycline (MONODOX) 100 MG capsule Take 1 capsule (100 mg total) by mouth 2 (two) times daily. (Patient not taking: Reported on 11/23/2015)  . glipiZIDE (GLUCOTROL) 5 MG tablet Take 1 tablet (5 mg total) by mouth daily before breakfast.  . glucose blood test strip Test blood glucose one to four times daily as directed.  . hydrochlorothiazide (HYDRODIURIL) 25 MG tablet Take 1 tablet (25 mg total) by mouth daily.  Marland Kitchen. PARoxetine (PAXIL) 20 MG tablet Take 1 tablet by mouth daily.  . polyethylene glycol powder (GLYCOLAX/MIRALAX) powder Take 17 g by mouth 2 (two) times daily as needed.  . Tdap (BOOSTRIX) 5-2.5-18.5 LF-MCG/0.5 injection Inject 0.5 mLs into the muscle once.  . timolol (BETIMOL) 0.5 % ophthalmic solution Place 1 drop into both eyes daily. Prescribed by opthalmologist  . TRAVATAN Z 0.004 % SOLN ophthalmic solution Place 1 drop into both eyes at bedtime.   No facility-administered encounter medications on file as of 05/26/2016.     Allergies: Cymbalta [duloxetine hcl]  Social History   Social History  . Marital status: Married    Spouse name: N/A  . Number of children: N/A  . Years of education: N/A   Occupational History  . Not on file.   Social History Main Topics  . Smoking status: Never Smoker  . Smokeless tobacco: Never Used  . Alcohol use No  . Drug use: No  . Sexual activity: Not on file   Other Topics Concern  . Not on file   Social History Narrative  . No narrative on file    Family History  Problem Relation Age of Onset  . Diabetes Mother   . Heart disease Mother   . Hypertension Father     Review of Systems: Review of Systems  Constitutional: Negative.   HENT: Negative.   Eyes: Positive for blurred vision.  Respiratory: Negative.   Cardiovascular: Positive for chest pain.  Gastrointestinal: Negative.   Genitourinary: Negative.   Musculoskeletal: Negative.   Skin: Negative.    Neurological: Positive for dizziness.  Endo/Heme/Allergies: Negative.   Psychiatric/Behavioral: Positive for depression. The patient is nervous/anxious.    --------------------------------------------------------------------------------------------------  Physical Exam: BP (!) 132/96   Pulse 76   Ht 6\' 3"  (1.905 m)   Wt 296 lb 12.8 oz (134.6 kg)   BMI 37.10 kg/m   General:  Obese man, seated comfortably in the exam room. He is accompanied by his wife. HEENT: No conjunctival pallor or scleral icterus.  Moist mucous membranes.  OP clear. Neck: Supple without lymphadenopathy, thyromegaly, JVD, or HJR.  No carotid bruit. Lungs: Normal work of breathing.  Clear to auscultation bilaterally without wheezes or crackles. Heart: Regular rate and rhythm without murmurs, rubs, or gallops.  Non-displaced PMI. Abd: Bowel sounds present.  Soft, NT/ND without hepatosplenomegaly Ext: No lower extremity edema.  Radial, PT, and DP pulses are 2+ bilaterally Skin: warm and dry without rash Neuro: CNIII-XII intact.  Strength and fine-touch sensation intact in upper and lower extremities bilaterally. Psych: Normal mood and affect.  EKG:  Normal sinus rhythm without significant abnormalities.  Lab Results  Component Value Date   WBC 4.9 02/06/2016   HGB 15.7 02/06/2016   HCT 46.5 02/06/2016   MCV 83.0  02/06/2016   PLT 284 02/06/2016    Lab Results  Component Value Date   NA 133 (L) 02/06/2016   K 3.7 02/06/2016   CL 100 (L) 02/06/2016   CO2 24 02/06/2016   BUN 20 02/06/2016   CREATININE 1.77 (H) 02/06/2016   GLUCOSE 147 (H) 02/06/2016   ALT 30 06/27/2015    Lab Results  Component Value Date   CHOL 140 04/22/2015   HDL 43 04/22/2015   LDLCALC 64 04/22/2015   LDLDIRECT 132 (H) 09/24/2013   TRIG 165 (H) 04/22/2015   CHOLHDL 3.3 04/22/2015    --------------------------------------------------------------------------------------------------  ASSESSMENT AND PLAN: Atypical chest  pain Mr. Evonnie PatVanstory's pain is atypical, given that it is brief, sharp, and not exertional. However, he also endorses exertional shortness of breath and has multiple cardiovascular risk factors, including prior stroke, diabetes mellitus, hypertension, hyperlipidemia, age>55, and male gender. We have therefore agreed to proceed with pharmacologic myocardial perfusion stress test. I have asked the patient to restart low-dose aspirin. If he has return of tinnitus, we will consider switching him to clopidogrel instead, given his history of stroke. We will increase carvedilol to 25 mg twice a day, for improved blood pressure control and antianginal therapy.  Hypertension Blood pressure is elevated, given that his goal is less than 130/80. We will increase carvedilol to 25 mg twice a day.  Hyperlipidemia The patient reports that atorvastatin was recently decreased due to elevated muscle enzymes. Given his multiple risk factors and history of stroke, the patient should be on high intensity statin therapy, if tolerated. We will check a lipid panel today as well as ALT and creatine kinase. Assuming his ALT and CK are normal, we will consider increasing atorvastatin back to a target dose of 40-80 mg daily.  Follow-up: Return to clinic in 3 months  Yvonne Kendallhristopher Corliss Coggeshall, MD 05/26/2016 8:26 AM

## 2016-05-26 NOTE — Patient Instructions (Addendum)
Medication Instructions:  Take aspirin 81mg  daily.  Increase coreg (carvedilol) to 25mg  two times a day. You can take 2 of your 12.5mg  tablets two times a day and use your current supply.  Labwork: Lipid profile/ALT/CK today  Testing/Procedures: Your physician has requested that you have a lexiscan myoview. For further information please visit [email or web address]. Please follow instruction sheet, as given.    Follow-Up: Your physician recommends that you schedule a follow-up appointment in: 3 months with Dr End.          If you need a refill on your cardiac medications before your next appointment, please call your pharmacy.

## 2016-05-27 LAB — CK: CK TOTAL: 561 U/L — AB (ref 7–232)

## 2016-05-30 ENCOUNTER — Other Ambulatory Visit: Payer: Self-pay | Admitting: *Deleted

## 2016-05-30 DIAGNOSIS — R748 Abnormal levels of other serum enzymes: Secondary | ICD-10-CM

## 2016-06-09 ENCOUNTER — Telehealth (HOSPITAL_COMMUNITY): Payer: Self-pay | Admitting: *Deleted

## 2016-06-09 NOTE — Telephone Encounter (Signed)
Left message on voicemail per DPR in reference to upcoming appointment scheduled on 06/14/16 at 1230 with detailed instructions given per Myocardial Perfusion Study Information Sheet for the test. LM to arrive 15 minutes early, and that it is imperative to arrive on time for appointment to keep from having the test rescheduled. If you need to cancel or reschedule your appointment, please call the office within 24 hours of your appointment. Failure to do so may result in a cancellation of your appointment, and a $50 no show fee. Phone number given for call back for any questions.

## 2016-06-14 ENCOUNTER — Other Ambulatory Visit: Payer: Medicare Other

## 2016-06-14 ENCOUNTER — Ambulatory Visit (HOSPITAL_COMMUNITY): Payer: Medicare Other

## 2016-06-15 ENCOUNTER — Ambulatory Visit (HOSPITAL_COMMUNITY): Payer: Medicare Other

## 2016-07-13 ENCOUNTER — Telehealth (HOSPITAL_COMMUNITY): Payer: Self-pay | Admitting: *Deleted

## 2016-07-13 NOTE — Telephone Encounter (Signed)
Patient given detailed instructions per Myocardial Perfusion Study Information Sheet for the test on 07/18/16 at 1230. Patient notified to arrive 15 minutes early and that it is imperative to arrive on time for appointment to keep from having the test rescheduled.  If you need to cancel or reschedule your appointment, please call the office within 24 hours of your appointment. Failure to do so may result in a cancellation of your appointment, and a $50 no show fee. Patient verbalized understanding.Chris Vaughn W    

## 2016-07-18 ENCOUNTER — Ambulatory Visit (HOSPITAL_COMMUNITY): Payer: Medicare Other

## 2016-07-18 ENCOUNTER — Other Ambulatory Visit: Payer: Medicare Other

## 2016-07-19 ENCOUNTER — Ambulatory Visit (HOSPITAL_COMMUNITY): Payer: Medicare Other

## 2016-08-02 ENCOUNTER — Telehealth: Payer: Self-pay | Admitting: Internal Medicine

## 2016-08-02 NOTE — Telephone Encounter (Signed)
LM for pt to c/b to discuss where his insurance will authorize him to have his nuclear test and cardiologists that are on his [employer] plan.  We do not accept his current [employer] plan.  Pt would be self pay if he chooses to still have us be his cardiologist.

## 2016-08-03 NOTE — Telephone Encounter (Signed)
Left vm to call us concerning scheduling him at an "in network" provider/testing facility for his myoview.  I advised I had spoken w/[employer] and had obtained a few choices that pt may want to see w/i his network.  I spoke w/[employer], [place], [phone].  Based on pt's plan and zip code, she gave me a few MD's pt can see - Dr. Wonda Oldsavid Bohle or Abelardo DieselJeffrey Clevenger, both at [phone] and Dr. Orpah CobbAjay Kadakia [phone].  They are in network w/his plan.  Pt can call them back or look on [email or web address] should he want to see more options.

## 2016-08-03 NOTE — Telephone Encounter (Signed)
Spoke w/pt and advised I had obtained a few MDs from his insurance company that were in network.  He was w/his son who was having a procedure but he would call me back either today or tomorrow to discuss.

## 2016-08-11 ENCOUNTER — Other Ambulatory Visit: Payer: Self-pay | Admitting: Family Medicine

## 2016-08-11 DIAGNOSIS — I10 Essential (primary) hypertension: Secondary | ICD-10-CM

## 2016-08-30 ENCOUNTER — Telehealth: Payer: Self-pay | Admitting: Internal Medicine

## 2016-08-30 NOTE — Telephone Encounter (Signed)
08-30-16 Called [name] to see if he still wanted to keep the appointment to see Dr. Okey DupreEnd on 09-01-16.     See telephone note from Charmaine on 08-02-16 regarding patient insurance.   [name] was to call us back to let us know what his decision on his future appointment.  He wanted me to cancel the appointment due to his son is still in the hospital and he was out of town.

## 2016-09-01 ENCOUNTER — Ambulatory Visit: Payer: Medicare Other | Admitting: Internal Medicine

## 2016-09-01 NOTE — Telephone Encounter (Signed)
That is reasonable. I would like for him to have some cardiology follow-up, whether it is with us or another provider in his insurance network.  Chris Kendallhristopher Mabeline Varas, MD [facility] Pager: [phone]

## 2016-09-05 ENCOUNTER — Other Ambulatory Visit: Payer: Self-pay | Admitting: Family Medicine

## 2016-09-05 DIAGNOSIS — I10 Essential (primary) hypertension: Secondary | ICD-10-CM

## 2016-11-14 ENCOUNTER — Other Ambulatory Visit: Payer: Self-pay | Admitting: Family Medicine

## 2016-11-14 NOTE — Telephone Encounter (Signed)
Please call patient and make sure he is picking up the Carvedilol 25mg  tablets that was prescribed by his Cardiologist in November.  He should no longer be on 12.5mg  tablets.  Carvedilol 25mg  was filled in November for 3 month supply with 1 refill.

## 2016-11-17 NOTE — Telephone Encounter (Signed)
Contacted pt and he said that he was changed to the 25mg  due to a test that was going to be run and got cancelled and he wasn't sure if he should continue the 25 mg or not.  Told pt to contact cardiologist to confirm and also told him that there was one refill on the 25 mg if he found out he should be taking that dosage.  Pt stated he would contact them and let them know. Chris Vaughn, [place] Student D, [place]

## 2016-12-17 IMAGING — DX DG CHEST 2V
2 series · 2 of 2 positions shown · non-contrast
Comparison: None.

CLINICAL DATA: Right-sided chest pain for several days radiating
into right neck region. One day history of shortness of breath

EXAM:
CHEST  2 VIEW

[chest pa]
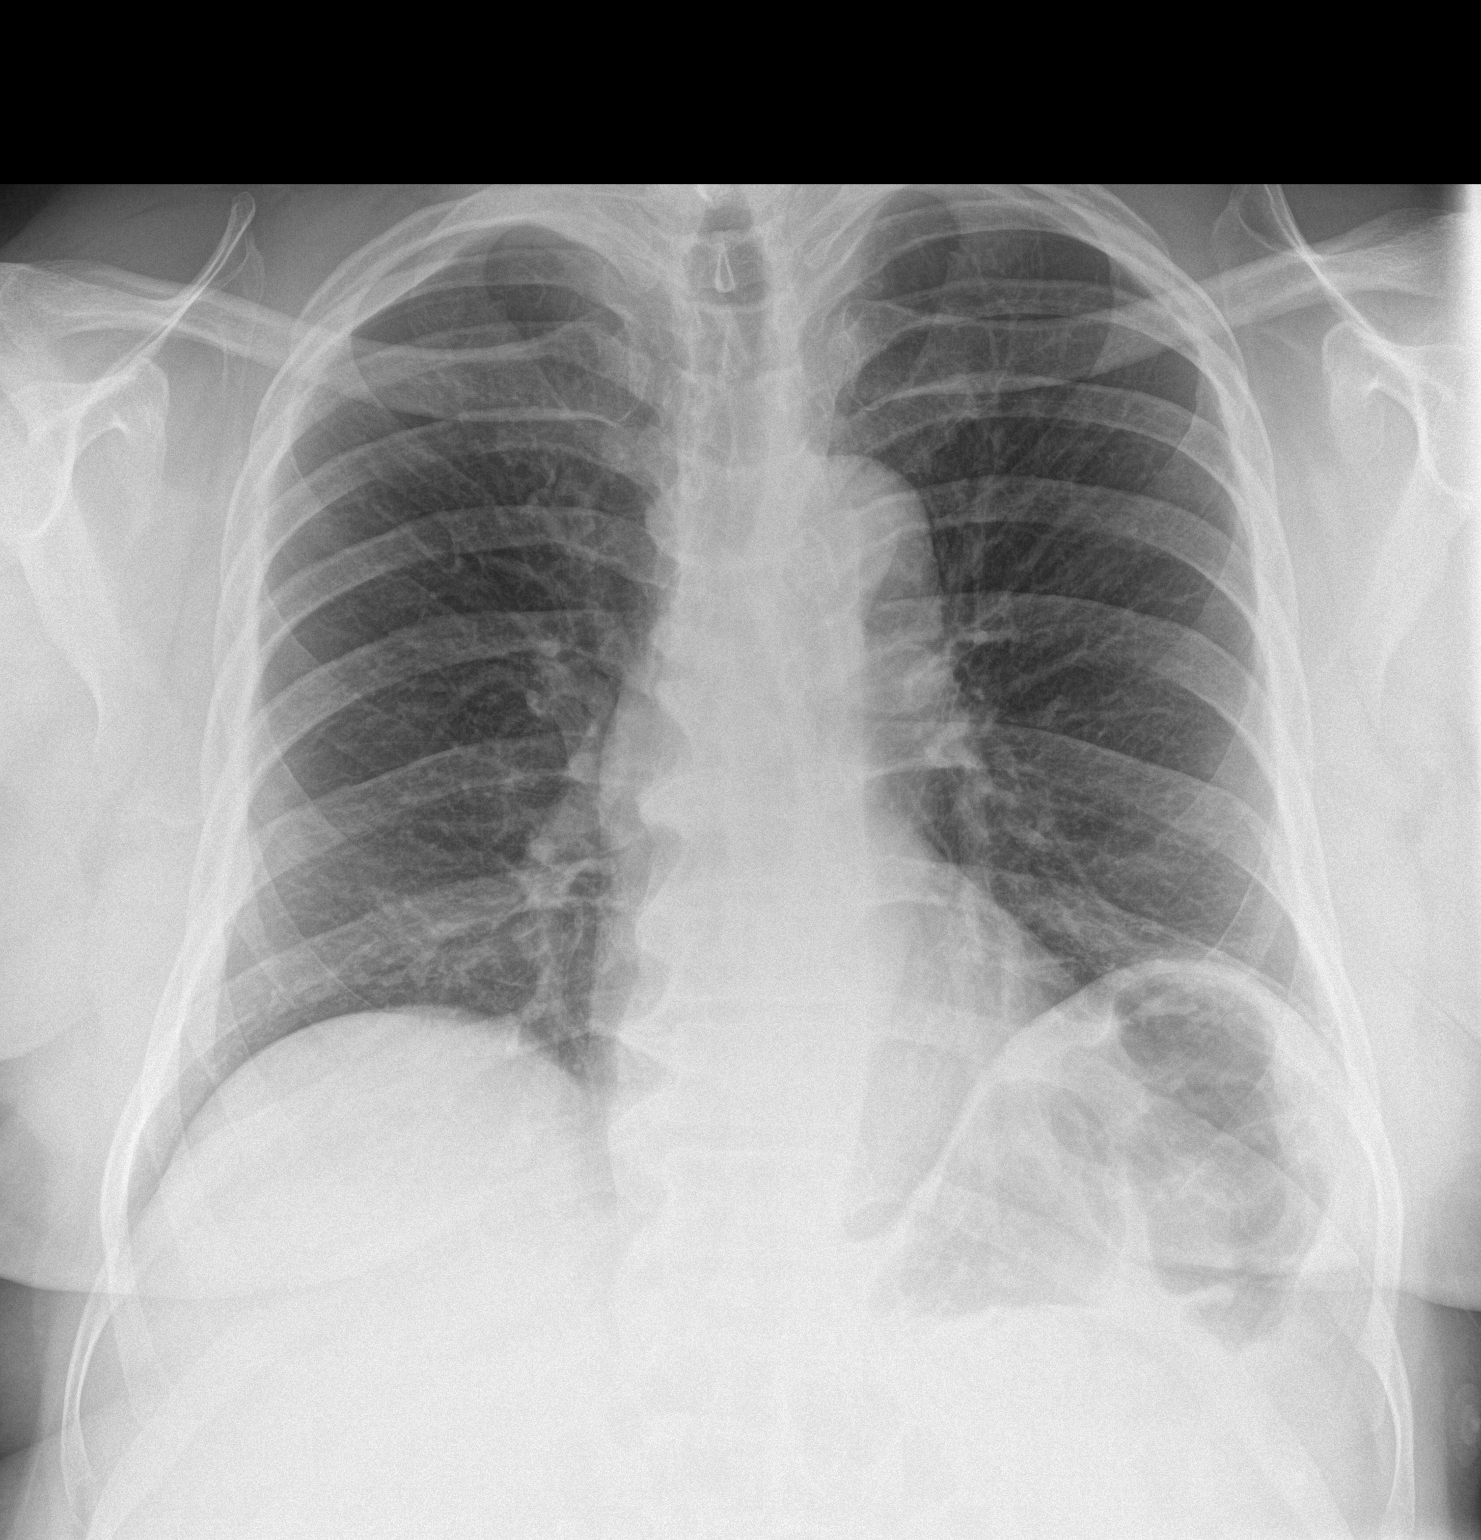

[chest lat]
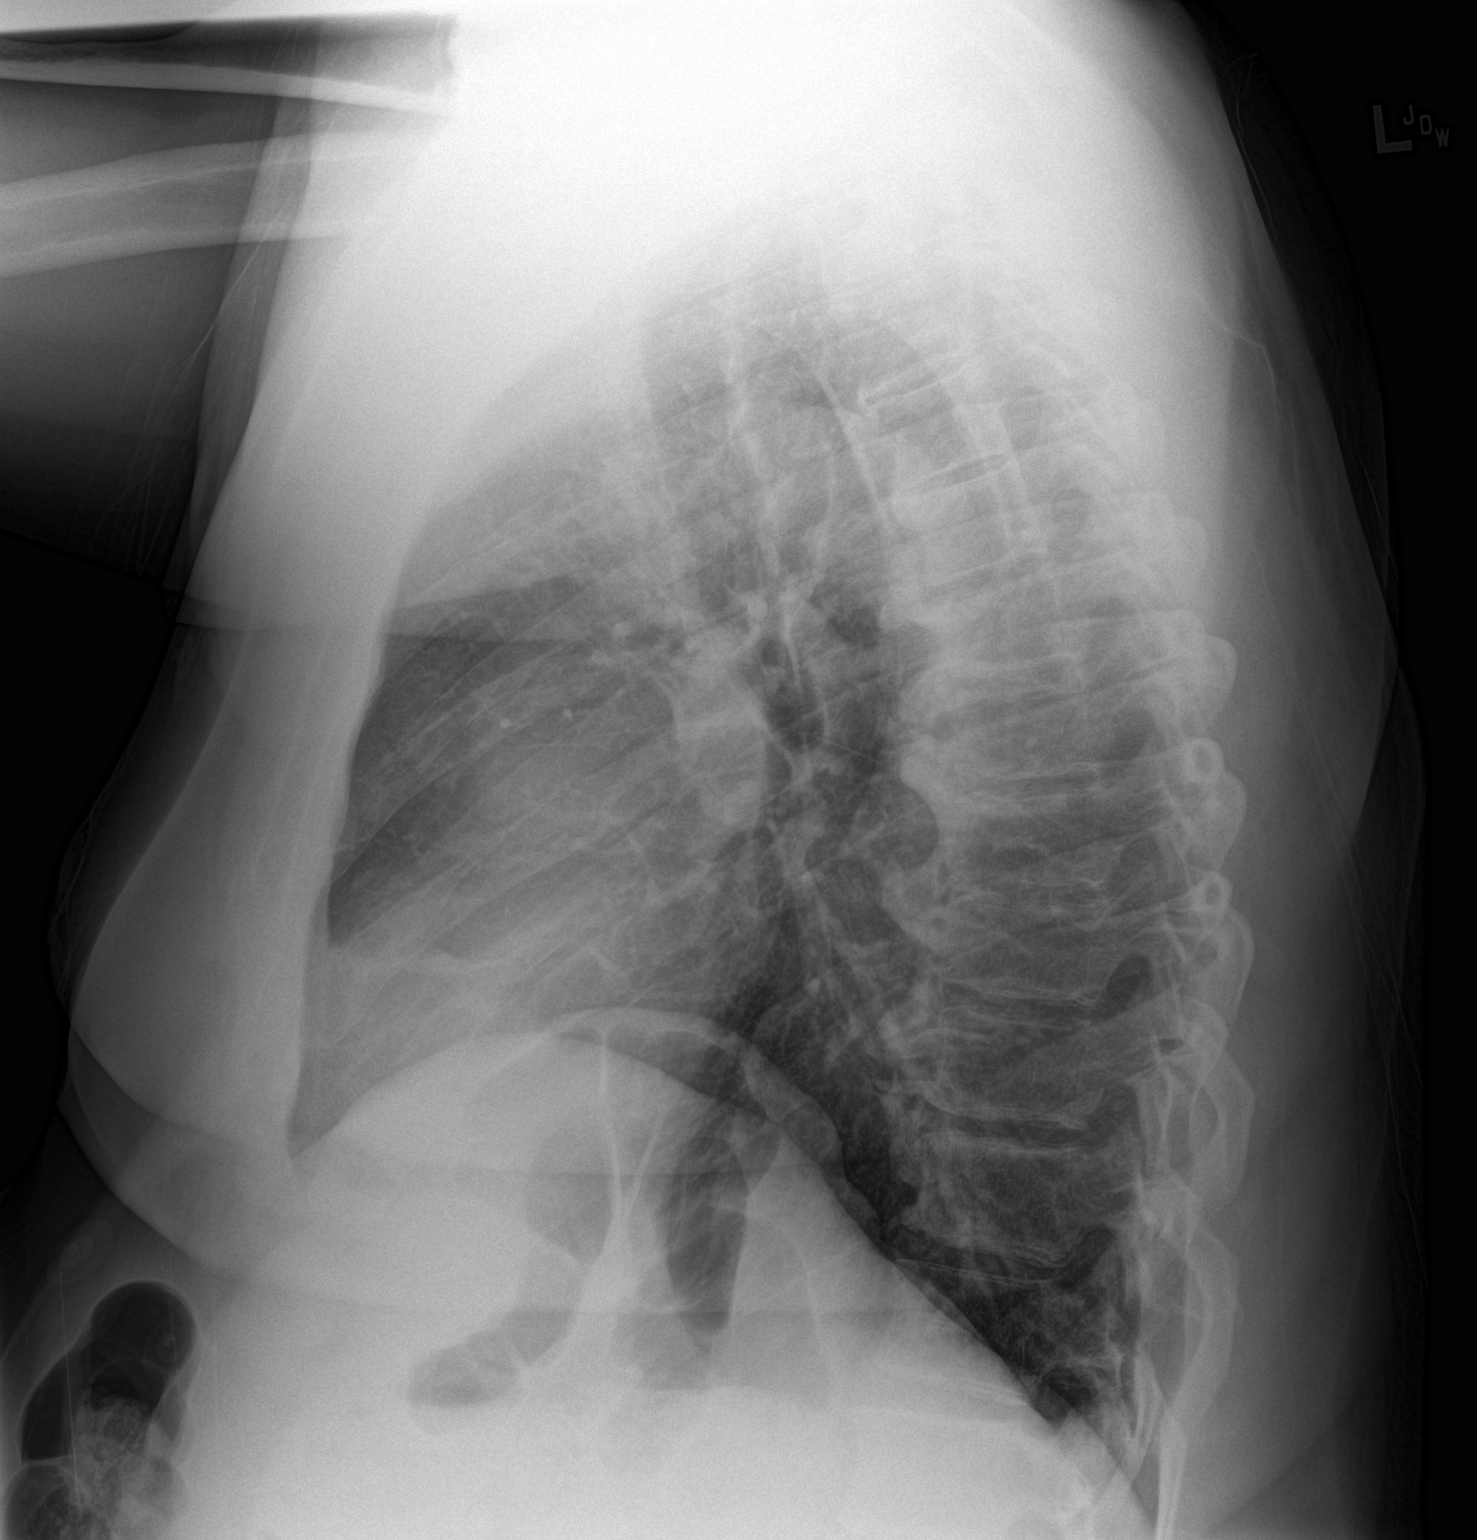

[2 of 2 positions shown; findings below may reference images not displayed]

FINDINGS: There is no edema or consolidation. Heart size and pulmonary
vascularity are normal. No adenopathy. No pneumothorax. There is
degenerative change in the thoracic spine with diffuse idiopathic
skeletal hyperostosis.
IMPRESSION: No edema or consolidation.

## 2017-02-06 ENCOUNTER — Ambulatory Visit: Payer: Medicare Other | Admitting: Dietician

## 2017-06-25 ENCOUNTER — Other Ambulatory Visit: Payer: Self-pay

## 2017-06-25 ENCOUNTER — Emergency Department (HOSPITAL_COMMUNITY): Payer: Medicare Other

## 2017-06-25 ENCOUNTER — Inpatient Hospital Stay (HOSPITAL_COMMUNITY)
Admission: EM | Admit: 2017-06-25 | Discharge: 2017-06-29 | DRG: 176 | Disposition: A | Discharge status: Home Health | Payer: Medicare Other | Attending: Internal Medicine | Admitting: Internal Medicine

## 2017-06-25 ENCOUNTER — Encounter (HOSPITAL_COMMUNITY): Payer: Self-pay | Admitting: Emergency Medicine

## 2017-06-25 DIAGNOSIS — R0602 Shortness of breath: Secondary | ICD-10-CM | POA: Diagnosis present

## 2017-06-25 DIAGNOSIS — I130 Hypertensive heart and chronic kidney disease with heart failure and stage 1 through stage 4 chronic kidney disease, or unspecified chronic kidney disease: Secondary | ICD-10-CM | POA: Diagnosis present

## 2017-06-25 DIAGNOSIS — R000 Tachycardia, unspecified: Secondary | ICD-10-CM | POA: Diagnosis not present

## 2017-06-25 DIAGNOSIS — Z8673 Personal history of transient ischemic attack (TIA), and cerebral infarction without residual deficits: Secondary | ICD-10-CM

## 2017-06-25 DIAGNOSIS — I82441 Acute embolism and thrombosis of right tibial vein: Secondary | ICD-10-CM | POA: Diagnosis present

## 2017-06-25 DIAGNOSIS — E785 Hyperlipidemia, unspecified: Secondary | ICD-10-CM | POA: Diagnosis not present

## 2017-06-25 DIAGNOSIS — I214 Non-ST elevation (NSTEMI) myocardial infarction: Secondary | ICD-10-CM

## 2017-06-25 DIAGNOSIS — I5032 Chronic diastolic (congestive) heart failure: Secondary | ICD-10-CM | POA: Diagnosis present

## 2017-06-25 DIAGNOSIS — R0902 Hypoxemia: Secondary | ICD-10-CM | POA: Diagnosis present

## 2017-06-25 DIAGNOSIS — I82411 Acute embolism and thrombosis of right femoral vein: Secondary | ICD-10-CM | POA: Diagnosis present

## 2017-06-25 DIAGNOSIS — I10 Essential (primary) hypertension: Secondary | ICD-10-CM | POA: Diagnosis present

## 2017-06-25 DIAGNOSIS — Z8249 Family history of ischemic heart disease and other diseases of the circulatory system: Secondary | ICD-10-CM

## 2017-06-25 DIAGNOSIS — Z7984 Long term (current) use of oral hypoglycemic drugs: Secondary | ICD-10-CM

## 2017-06-25 DIAGNOSIS — E119 Type 2 diabetes mellitus without complications: Secondary | ICD-10-CM | POA: Diagnosis not present

## 2017-06-25 DIAGNOSIS — I824Z3 Acute embolism and thrombosis of unspecified deep veins of distal lower extremity, bilateral: Secondary | ICD-10-CM | POA: Diagnosis present

## 2017-06-25 DIAGNOSIS — I2699 Other pulmonary embolism without acute cor pulmonale: Secondary | ICD-10-CM | POA: Diagnosis not present

## 2017-06-25 DIAGNOSIS — N183 Chronic kidney disease, stage 3 unspecified: Secondary | ICD-10-CM | POA: Diagnosis present

## 2017-06-25 DIAGNOSIS — F419 Anxiety disorder, unspecified: Secondary | ICD-10-CM | POA: Diagnosis present

## 2017-06-25 DIAGNOSIS — R778 Other specified abnormalities of plasma proteins: Secondary | ICD-10-CM | POA: Diagnosis present

## 2017-06-25 DIAGNOSIS — Z833 Family history of diabetes mellitus: Secondary | ICD-10-CM

## 2017-06-25 DIAGNOSIS — E1122 Type 2 diabetes mellitus with diabetic chronic kidney disease: Secondary | ICD-10-CM | POA: Diagnosis present

## 2017-06-25 DIAGNOSIS — R7989 Other specified abnormal findings of blood chemistry: Secondary | ICD-10-CM | POA: Diagnosis not present

## 2017-06-25 DIAGNOSIS — I82431 Acute embolism and thrombosis of right popliteal vein: Secondary | ICD-10-CM | POA: Diagnosis present

## 2017-06-25 DIAGNOSIS — Z841 Family history of disorders of kidney and ureter: Secondary | ICD-10-CM

## 2017-06-25 DIAGNOSIS — K219 Gastro-esophageal reflux disease without esophagitis: Secondary | ICD-10-CM | POA: Diagnosis present

## 2017-06-25 DIAGNOSIS — E871 Hypo-osmolality and hyponatremia: Secondary | ICD-10-CM | POA: Diagnosis present

## 2017-06-25 DIAGNOSIS — R748 Abnormal levels of other serum enzymes: Secondary | ICD-10-CM | POA: Diagnosis not present

## 2017-06-25 LAB — CBC
HCT: 44.6 % (ref 39.0–52.0)
Hemoglobin: 15.2 g/dL (ref 13.0–17.0)
MCH: 28.1 pg (ref 26.0–34.0)
MCHC: 34.1 g/dL (ref 30.0–36.0)
MCV: 82.6 fL (ref 78.0–100.0)
Platelets: 275 10*3/uL (ref 150–400)
RBC: 5.4 MIL/uL (ref 4.22–5.81)
RDW: 14.5 % (ref 11.5–15.5)
WBC: 8.2 10*3/uL (ref 4.0–10.5)

## 2017-06-25 LAB — I-STAT TROPONIN, ED: TROPONIN I, POC: 0.78 ng/mL — AB (ref 0.00–0.08)

## 2017-06-25 LAB — BASIC METABOLIC PANEL
Anion gap: 11 (ref 5–15)
BUN: 17 mg/dL (ref 6–20)
CALCIUM: 9.1 mg/dL (ref 8.9–10.3)
CO2: 23 mmol/L (ref 22–32)
CREATININE: 1.71 mg/dL — AB (ref 0.61–1.24)
Chloride: 98 mmol/L — ABNORMAL LOW (ref 101–111)
GFR calc Af Amer: 47 mL/min — ABNORMAL LOW (ref >60)
GFR, EST NON AFRICAN AMERICAN: 41 mL/min — AB (ref >60)
GLUCOSE: 215 mg/dL — AB (ref 65–99)
Potassium: 3.7 mmol/L (ref 3.5–5.1)
Sodium: 132 mmol/L — ABNORMAL LOW (ref 135–145)

## 2017-06-25 LAB — TROPONIN I
Troponin I: 0.57 ng/mL (ref <0.03)
Troponin I: 0.46 ng/mL (ref <0.03)

## 2017-06-25 LAB — CBG MONITORING, ED: Glucose-Capillary: 171 mg/dL — ABNORMAL HIGH (ref 65–99)

## 2017-06-25 LAB — D-DIMER, QUANTITATIVE
D-Dimer, Quant: 19.76 ug/mL-FEU — ABNORMAL HIGH (ref 0.00–0.50)
D-Dimer, Quant: >20 ug/mL-FEU — ABNORMAL HIGH (ref 0.00–0.50)

## 2017-06-25 LAB — GLUCOSE, CAPILLARY
GLUCOSE-CAPILLARY: 174 mg/dL — AB (ref 65–99)
Glucose-Capillary: 220 mg/dL — ABNORMAL HIGH (ref 65–99)

## 2017-06-25 LAB — BRAIN NATRIURETIC PEPTIDE: B Natriuretic Peptide: 336 pg/mL — ABNORMAL HIGH (ref 0.0–100.0)

## 2017-06-25 MED ORDER — INSULIN ASPART 100 UNIT/ML ~~LOC~~ SOLN
0.0000 [IU] | Freq: Three times a day (TID) | SUBCUTANEOUS | Status: DC
  Administered 2017-06-25 – 2017-06-26 (×2): 3 [IU] via SUBCUTANEOUS
  Administered 2017-06-26: 5 [IU] via SUBCUTANEOUS
  Administered 2017-06-26 – 2017-06-27 (×3): 2 [IU] via SUBCUTANEOUS
  Administered 2017-06-27: 3 [IU] via SUBCUTANEOUS
  Administered 2017-06-28: 2 [IU] via SUBCUTANEOUS
  Administered 2017-06-28 – 2017-06-29 (×2): 3 [IU] via SUBCUTANEOUS
  Filled 2017-06-25: qty 1

## 2017-06-25 MED ORDER — ASPIRIN EC 325 MG PO TBEC
325.0000 mg | DELAYED_RELEASE_TABLET | Freq: Once | ORAL | Status: AC
  Administered 2017-06-25: 325 mg via ORAL
  Filled 2017-06-25: qty 1

## 2017-06-25 MED ORDER — ENOXAPARIN SODIUM 120 MG/0.8ML ~~LOC~~ SOLN
120.0000 mg | Freq: Once | SUBCUTANEOUS | Status: AC
  Administered 2017-06-25: 120 mg via SUBCUTANEOUS
  Filled 2017-06-25: qty 0.8

## 2017-06-25 MED ORDER — ASPIRIN 81 MG PO CHEW: 324.0000 mg | CHEWABLE_TABLET | Freq: Once | ORAL | Status: DC

## 2017-06-25 MED ORDER — ACETAMINOPHEN 325 MG PO TABS
650.0000 mg | ORAL_TABLET | Freq: Four times a day (QID) | ORAL | Status: DC | PRN
Start: 2017-06-25 — End: 2017-06-29
  Administered 2017-06-26 – 2017-06-27 (×2): 650 mg via ORAL
  Filled 2017-06-25 (×2): qty 2

## 2017-06-25 MED ORDER — AMLODIPINE BESYLATE 5 MG PO TABS
5.0000 mg | ORAL_TABLET | Freq: Every day | ORAL | Status: DC
  Administered 2017-06-26 – 2017-06-29 (×4): 5 mg via ORAL
  Filled 2017-06-25 (×4): qty 1

## 2017-06-25 MED ORDER — ONDANSETRON HCL 4 MG/2ML IJ SOLN: 4.0000 mg | Freq: Four times a day (QID) | INTRAMUSCULAR | Status: DC | PRN

## 2017-06-25 MED ORDER — TECHNETIUM TC 99M DIETHYLENETRIAME-PENTAACETIC ACID
32.2000 | Freq: Once | INTRAVENOUS | Status: AC | PRN
  Administered 2017-06-25: 32.2 via RESPIRATORY_TRACT

## 2017-06-25 MED ORDER — ONDANSETRON HCL 4 MG PO TABS: 4.0000 mg | ORAL_TABLET | Freq: Four times a day (QID) | ORAL | Status: DC | PRN

## 2017-06-25 MED ORDER — TECHNETIUM TO 99M ALBUMIN AGGREGATED
4.2800 | Freq: Once | INTRAVENOUS | Status: AC | PRN
  Administered 2017-06-25: 4.28 via INTRAVENOUS

## 2017-06-25 MED ORDER — CARVEDILOL 25 MG PO TABS
25.0000 mg | ORAL_TABLET | Freq: Two times a day (BID) | ORAL | Status: DC
  Administered 2017-06-25 – 2017-06-29 (×6): 25 mg via ORAL
  Filled 2017-06-25 (×2): qty 1
  Filled 2017-06-25: qty 2
  Filled 2017-06-25 (×6): qty 1

## 2017-06-25 MED ORDER — ACETAMINOPHEN 650 MG RE SUPP: 650.0000 mg | Freq: Four times a day (QID) | RECTAL | Status: DC | PRN

## 2017-06-25 MED ORDER — SENNOSIDES-DOCUSATE SODIUM 8.6-50 MG PO TABS: 1.0000 | ORAL_TABLET | Freq: Every evening | ORAL | Status: DC | PRN

## 2017-06-25 MED ORDER — DIAZEPAM 2 MG PO TABS
2.0000 mg | ORAL_TABLET | Freq: Three times a day (TID) | ORAL | Status: DC | PRN
  Administered 2017-06-25 – 2017-06-27 (×4): 2 mg via ORAL
  Filled 2017-06-25 (×6): qty 1

## 2017-06-25 MED ORDER — SODIUM CHLORIDE 0.9% FLUSH
3.0000 mL | Freq: Two times a day (BID) | INTRAVENOUS | Status: DC
  Administered 2017-06-25 – 2017-06-29 (×8): 3 mL via INTRAVENOUS

## 2017-06-25 MED ORDER — ENOXAPARIN SODIUM 40 MG/0.4ML ~~LOC~~ SOLN
40.0000 mg | SUBCUTANEOUS | Status: DC
  Filled 2017-06-25: qty 0.4

## 2017-06-25 MED ORDER — SODIUM CHLORIDE 0.9 % IV SOLN
INTRAVENOUS | Status: DC
  Administered 2017-06-25 (×2): via INTRAVENOUS

## 2017-06-25 MED ORDER — ENOXAPARIN SODIUM 120 MG/0.8ML ~~LOC~~ SOLN
120.0000 mg | Freq: Two times a day (BID) | SUBCUTANEOUS | Status: DC
  Administered 2017-06-26 – 2017-06-28 (×5): 120 mg via SUBCUTANEOUS
  Filled 2017-06-25 (×5): qty 0.8

## 2017-06-25 MED ORDER — GLIPIZIDE 5 MG PO TABS
5.0000 mg | ORAL_TABLET | Freq: Every day | ORAL | Status: DC
  Administered 2017-06-26 – 2017-06-29 (×4): 5 mg via ORAL
  Filled 2017-06-25 (×4): qty 1

## 2017-06-25 NOTE — H&P (Signed)
History and Physical  Chris Manuelnthony T Mersereau ZOX:[id number] DOB: September 06, 1955 DOA: 06/25/2017  Referring physician: Dr Freida BusmanAllen, ED physician PCP: Ralene OkMoreira, Roy, MD  Outpatient Specialists: none  Patient Coming From: home  Chief Complaint: Shortness of breath  HPI: Chris Vaughn is a 61 y.o. male with a history of chronic kidney disease stage III, diabetes, anxiety, history of stroke, GERD.  Patient seen for shortness of breath that started yesterday it has been gradually worsening.  Shortness of breath worse with exertion and improved at rest.  Slight dry cough, no wheezing.  Had a brief episode of chest pain, which she described as burning and tightness, but denies chest pressure.  No other palliating or provoking factors.  Patient came to the emergency department for evaluation as pain has not been improving.  He admits to mild nausea, but no diaphoresis.  Cardiac risk factors include hypertension, hyperlipidemia, diabetes.  Patient not a smoker.  The patient's son just recently had a MI (his son has a history of liver failure and is a liver transplant patient)  Emergency Department Course: D-dimer elevated to 18.  VQ scan done as patient has chronic kidney disease stage III.  VQ scan shows low probability of PE.  Troponin slightly elevated at 0.78.   Review of Systems:   Pt denies any fevers, chills, nausea, vomiting, diarrhea, constipation, abdominal pain, shortness of breath, dyspnea on exertion, orthopnea, cough, wheezing, palpitations, headache, vision changes, lightheadedness, dizziness, melena, rectal bleeding.  Review of systems are otherwise negative  Past Medical History:  Diagnosis Date  . Chronic kidney disease   . Depression   . Diabetes mellitus without complication (HCC)   . Hyperlipidemia   . Hypertension   . Stroke [facility](HCC)    Past Surgical History:  Procedure Laterality Date  . BACK SURGERY     Social History:  reports that  has never smoked. he has never used  smokeless tobacco. He reports that he does not drink alcohol or use drugs. Patient lives at home  Allergies  Allergen Reactions  . Cymbalta [Duloxetine Hcl] Other (See Comments)    Disoriented, bad feelings, doesn't seem to work for patient    Family History  Problem Relation Age of Onset  . Diabetes Mother   . Heart disease Mother   . Kidney disease Mother   . Hypertension Father   . Heart disease Brother       Prior to Admission medications   Medication Sig Start Date End Date Taking? Authorizing Provider  amLODipine (NORVASC) 5 MG tablet Take 5 mg by mouth daily. 06/20/17  Yes [provider]  benazepril (LOTENSIN) 40 MG tablet TAKE ONE TABLET BY MOUTH ONCE DAILY 05/16/16  Yes Delynn FlavinGottschalk, Ashly M, DO  carvedilol (COREG) 25 MG tablet Take 1 tablet (25 mg total) by mouth 2 (two) times daily. Patient taking differently: Take 25 mg by mouth 2 (two) times daily. Patient taking one-half tablet (12.5mg ) 05/26/16 06/25/17 Yes End, Cristal Deerhristopher, MD  diazepam (VALIUM) 2 MG tablet Take 1 tablet by mouth 3 (three) times daily. 09/01/15  Yes Dolores FrameHeaden, Kenneth, MD  glipiZIDE (GLUCOTROL) 5 MG tablet Take 1 tablet (5 mg total) by mouth daily before breakfast. 05/09/16  Yes Gottschalk, Ashly M, DO  hydrochlorothiazide (HYDRODIURIL) 25 MG tablet TAKE ONE TABLET BY MOUTH ONCE DAILY 08/11/16  Yes Delynn FlavinGottschalk, Ashly M, DO  clotrimazole (LOTRIMIN) 1 % cream Apply 1 application topically 2 (two) times daily. Patient not taking: Reported on 06/25/2017 07/10/15   Raliegh IpGottschalk, Ashly M, DO  glucose blood test strip Test blood glucose one to four times daily as directed. 04/01/16   [name], DO  polyethylene glycol powder (GLYCOLAX/MIRALAX) powder Take 17 g by mouth 2 (two) times daily as needed. Patient not taking: Reported on 06/25/2017 07/10/15   [name], DO  Tdap (BOOSTRIX) 5-2.5-18.5 LF-MCG/0.5 injection Inject 0.5 mLs into the muscle once. 07/10/15   [name], DO     Physical Exam: BP (!) 151/104   Pulse (!) 105   Temp 98 F (36.7 C) (Oral)   Resp (!) 23   Ht 6\' 3"  (1.905 m)   Wt 122.5 kg (270 lb)   SpO2 99%   BMI 33.75 kg/m   General: Elderly black male. Awake and alert and oriented x3. No acute cardiopulmonary distress.  Somewhat anxious in appearance. HEENT: Normocephalic atraumatic.  Right and left ears normal in appearance.  Pupils equal, round, reactive to light. Extraocular muscles are intact. Sclerae anicteric and noninjected.  Moist mucosal membranes. No mucosal lesions.  Neck: Neck supple without lymphadenopathy. No carotid bruits. No masses palpated.  Cardiovascular: Regular rate with normal S1-S2 sounds. No murmurs, rubs, gallops auscultated. No JVD.  Respiratory: Good respiratory effort with no wheezes, rales, rhonchi. Lungs clear to auscultation bilaterally.  No accessory muscle use. Abdomen: Soft, nontender, nondistended. Active bowel sounds. No masses or hepatosplenomegaly  Skin: No rashes, lesions, or ulcerations.  Dry, warm to touch. 2+ dorsalis pedis and radial pulses. Musculoskeletal: No calf or leg pain. All major joints not erythematous nontender.  No upper or lower joint deformation.  Good ROM.  No contractures  Psychiatric: Intact judgment and insight. Pleasant and cooperative. Neurologic: No focal neurological deficits. Strength is 5/5 and symmetric in upper and lower extremities.  Cranial nerves II through XII are grossly intact.           Labs on Admission: I have personally reviewed following labs and imaging studies  CBC: Recent Labs  Lab 06/25/17 0936  WBC 8.2  HGB 15.2  HCT 44.6  MCV 82.6  PLT 275   Basic Metabolic Panel: Recent Labs  Lab 06/25/17 0936  NA 132*  K 3.7  CL 98*  CO2 23  GLUCOSE 215*  BUN 17  CREATININE 1.71*  CALCIUM 9.1   GFR: Estimated Creatinine Clearance: 62.4 mL/min (A) (by C-G formula based on SCr of 1.71 mg/dL (H)). Liver Function Tests: No results for input(s): AST,  ALT, ALKPHOS, BILITOT, PROT, ALBUMIN in the last 168 hours. No results for input(s): LIPASE, AMYLASE in the last 168 hours. No results for input(s): AMMONIA in the last 168 hours. Coagulation Profile: No results for input(s): INR, PROTIME in the last 168 hours. Cardiac Enzymes: No results for input(s): CKTOTAL, CKMB, CKMBINDEX, TROPONINI in the last 168 hours. BNP (last 3 results) No results for input(s): PROBNP in the last 8760 hours. HbA1C: No results for input(s): HGBA1C in the last 72 hours. CBG: No results for input(s): GLUCAP in the last 168 hours. Lipid Profile: No results for input(s): CHOL, HDL, LDLCALC, TRIG, CHOLHDL, LDLDIRECT in the last 72 hours. Thyroid Function Tests: No results for input(s): TSH, T4TOTAL, FREET4, T3FREE, THYROIDAB in the last 72 hours. Anemia Panel: No results for input(s): VITAMINB12, FOLATE, FERRITIN, TIBC, IRON, RETICCTPCT in the last 72 hours. Urine analysis:    Component Value Date/Time   COLORURINE YELLOW 06/27/2015 2024   APPEARANCEUR CLEAR 06/27/2015 2024   LABSPEC 1.005 06/27/2015 2024   PHURINE 6.5 06/27/2015 2024   GLUCOSEU 250 (A)  06/27/2015 2024   HGBUR SMALL (A) 06/27/2015 2024   BILIRUBINUR NEGATIVE 06/27/2015 2024   BILIRUBINUR SMALL 09/24/2013 1450   KETONESUR NEGATIVE 06/27/2015 2024   PROTEINUR NEGATIVE 06/27/2015 2024   UROBILINOGEN 0.2 10/16/2013 1242   NITRITE NEGATIVE 06/27/2015 2024   LEUKOCYTESUR NEGATIVE 06/27/2015 2024   Sepsis Labs: @LABRCNTIP (procalcitonin:4,lacticidven:4) )No results found for this or any previous visit (from the past 240 hour(s)).   Radiological Exams on Admission: Dg Chest 2 View  Result Date: 06/25/2017 CLINICAL DATA:  Shortness of breath.  Cough and dizziness. EXAM: CHEST  2 VIEW COMPARISON:  02/06/2016 FINDINGS: The heart size and mediastinal contours are within normal limits. Both lungs are clear. The visualized skeletal structures are unremarkable. IMPRESSION: No active cardiopulmonary  disease. Electronically Signed   By: Signa Kellaylor  Stroud M.D.   On: 06/25/2017 11:12   Nm Pulmonary Vent And Perf (v/q Scan)  Result Date: 06/25/2017 CLINICAL DATA:  61 year old male with nonproductive cough, right lower extremity swelling, tachycardia. EXAM: NUCLEAR MEDICINE VENTILATION - PERFUSION LUNG SCAN TECHNIQUE: Ventilation images were obtained in multiple projections using inhaled aerosol Tc-7170m DTPA. Perfusion images were obtained in multiple projections after intravenous injection of Tc-4670m MAA. RADIOPHARMACEUTICALS:  32.2 mCi Technetium-6070m DTPA aerosol inhalation and 4.3 mCi Technetium-3570m MAA IV COMPARISON:  Chest radiographs 0941 hours today. FINDINGS: Ventilation: Mildly asymmetric ventilation radiotracer distribution, more so into the left lung. Eventration or elevation of the hemidiaphragms again noted. No ventilation defect. Perfusion: More homogeneous radiotracer activity in both lungs. Smooth linear photopenic area present across the left lung. No other perfusion defect identified. IMPRESSION: Low probability of acute pulmonary embolus. Electronically Signed   By: Odessa FlemingH  Hall M.D.   On: 06/25/2017 13:23    EKG: Independently reviewed.  Sinus tachycardia with LVH.  Sloped ST segment.  No inverted T waves  Assessment/Plan: Active Problems:   Hyperlipidemia   CKD (chronic kidney disease) stage 3, GFR 30-59 ml/min (HCC)   Essential hypertension, benign   Type 2 diabetes mellitus without complication, without long-term current use of insulin (HCC)   SOB (shortness of breath)   Elevated troponin   Elevated d-dimer    This patient was discussed with the ED physician, including pertinent vitals, physical exam findings, labs, and imaging.  We also discussed care given by the ED provider.  1.  Shortness of breath on exertion  VQ scan low probability of PE  Uncertain of significance of elevated d-dimer in the setting  I discussed with the patient possibility of hydrating with IV fluids  for 6-12 hours, then doing CTA to definitively rule out PE as even with a low probability VQ scan, there is a 6% risk of having a false negative study.  Patient declines CTA at this point due to fears of contrast mediated nephropathy  I discussed that PE is a serious condition that could cause irreversible  2.  Elevated troponin  Question demand ischemia versus ACS  Repeat troponin and will follow trend  Cardiology to consult 3.  Elevated d-dimer  Question significance  Exam negative for evidence of DVT and VQ scan low probability of PE.  Will repeat ddimer 4.  Chronic kidney disease stage III  Check creatinine in the morning  Hold ACE inhibitor 5.  Type 2 diabetes  Patient has not taken his glyburide for the past month, will restart  Sliding scale insulin and CBGs before meals and nightly  Carb modified diet 6.  Hypertension  Continue antihypertensives 7.  Hyperlipidemia  Continue statin therapy  DVT  prophylaxis: Lovenox Consultants: Cardiology to see Code Status: Full code Family Communication: None Disposition Plan: Patient to return home following hospital stay   Levie Heritage, DO [facility] Pager [phone]  If 7PM-7AM, please contact night-coverage [email or web address] Password [email or web address]

## 2017-06-25 NOTE — ED Notes (Signed)
Patient transported to [facility] for V/Q scan.

## 2017-06-25 NOTE — ED Notes (Signed)
Patient returned to room and hooked back up to monitor.

## 2017-06-25 NOTE — Progress Notes (Addendum)
Patient Name: Chris Vaughn, male   DOB: 01-08-1956, 61 y.o.  MRN: [id number]  Repeat ddimer test elevated. Will place on therapeutic dose lovenox. Discussed test result with patient - pt amenable to CTA tomorrow after 12 hours IVF, which should mitigate contrast induced nephropathy.  Levie HeritageJacob J Antonio Creswell, DO 4:58 PM 06/25/2017

## 2017-06-25 NOTE — ED Triage Notes (Signed)
Pt states 2 days of dry cough with shortness of breath, also reports swelling to right ankle and right knee for 1 month. Denies any chest pain or pressure. Tachycardic at 124.

## 2017-06-25 NOTE — ED Notes (Signed)
Hospitalist at bedside. MD states okay for patient to eat/drink - provided graham crackers, peanut butter, and sprite.

## 2017-06-25 NOTE — ED Provider Notes (Signed)
Chris Vaughn [facility] EMERGENCY DEPARTMENT Provider Note   CSN: [id number] Arrival date & time: 06/25/17  0911     History   Chief Complaint Chief Complaint  Patient presents with  . Shortness of Breath  . Cough    HPI MASE DHONDT is a 61 y.o. male.  61 year old male presents with cough and shortness of breath times 3 days.  Patient thought that it was due to his exposure to Lysol chemical.  He states is difficult for him to sleep at night.  He is unsure if he has orthopnea.  He has had some increased dyspnea on exertion.  Denies any anginal type chest pain.  Has had some lower extremity edema.  No prior history of CHF but does have a history of chronic kidney disease.  No fever or chills.  Symptoms have been progressively worse and no treatment used prior to arrival      Past Medical History:  Diagnosis Date  . Chronic kidney disease   . Depression   . Diabetes mellitus without complication (HCC)   . Hyperlipidemia   . Hypertension   . Stroke [facility])     Patient Active Problem List   Diagnosis Date Noted  . Liver mass 08/17/2015  . Glaucoma 08/17/2015  . Constipation 07/10/2015  . Angina pectoris (HCC) 04/22/2015  . Chest pain 04/22/2015  . Esophageal reflux   . Essential hypertension, benign   . Type 2 diabetes mellitus without complication, without long-term current use of insulin (HCC)   . Mood disorder of depressed type 11/11/2014  . Rectal bleeding 04/01/2014  . Adjustment disorder with mixed anxiety and depressed mood 11/28/2013  . Hyperlipidemia 11/13/2013  . CKD (chronic kidney disease) stage 3, GFR 30-59 ml/min (HCC) 11/13/2013  . CVA (cerebral infarction) 10/16/2013  . Mild depression (HCC) 06/27/2012  . HTN, goal below 130/80 06/27/2012    Past Surgical History:  Procedure Laterality Date  . BACK SURGERY         Home Medications    Prior to Admission medications   Medication Sig Start Date End Date Taking? Authorizing  Provider  amLODipine (NORVASC) 10 MG tablet TAKE ONE TABLET BY MOUTH ONCE DAILY 09/05/16   Delynn Flavin M, DO  aspirin 81 MG EC tablet Take 1 tablet (81 mg total) by mouth daily. 05/26/16   End, Cristal Deer, MD  benazepril (LOTENSIN) 40 MG tablet TAKE ONE TABLET BY MOUTH ONCE DAILY 05/16/16   Delynn Flavin M, DO  carvedilol (COREG) 25 MG tablet Take 1 tablet (25 mg total) by mouth 2 (two) times daily. 05/26/16 08/24/16  End, Cristal Deer, MD  clotrimazole (LOTRIMIN) 1 % cream Apply 1 application topically 2 (two) times daily. 07/10/15   [name], DO  diazepam (VALIUM) 2 MG tablet Take 1 tablet by mouth 3 (three) times daily. 09/01/15   Dolores Frame, MD  glipiZIDE (GLUCOTROL) 5 MG tablet Take 1 tablet (5 mg total) by mouth daily before breakfast. 05/09/16   Delynn Flavin M, DO  glucose blood test strip Test blood glucose one to four times daily as directed. 04/01/16   Delynn Flavin M, DO  hydrochlorothiazide (HYDRODIURIL) 25 MG tablet TAKE ONE TABLET BY MOUTH ONCE DAILY 08/11/16   Delynn Flavin M, DO  PARoxetine (PAXIL) 20 MG tablet Take 1 tablet by mouth daily. 09/01/15   Dolores Frame, MD  polyethylene glycol powder (GLYCOLAX/MIRALAX) powder Take 17 g by mouth 2 (two) times daily as needed. 07/10/15   [name], DO  Tdap (  BOOSTRIX) 5-2.5-18.5 LF-MCG/0.5 injection Inject 0.5 mLs into the muscle once. 07/10/15   Raliegh IpGottschalk, Ashly M, DO    Family History Family History  Problem Relation Age of Onset  . Diabetes Mother   . Heart disease Mother   . Kidney disease Mother   . Hypertension Father   . Heart disease Brother     Social History Social History   Tobacco Use  . Smoking status: Never Smoker  . Smokeless tobacco: Never Used  Substance Use Topics  . Alcohol use: No  . Drug use: No     Allergies   Cymbalta [duloxetine hcl]   Review of Systems Review of Systems  All other systems reviewed and are negative.    Physical Exam Updated  Vital Signs BP (!) 180/110 (BP Location: Left Arm)   Pulse (!) 124   Temp 98 F (36.7 C) (Oral)   Resp 18   Ht 1.905 m (6\' 3" )   Wt 122.5 kg (270 lb)   SpO2 99%   BMI 33.75 kg/m   Physical Exam  Constitutional: He is oriented to person, place, and time. He appears well-developed and well-nourished.  Non-toxic appearance. No distress.  HENT:  Head: Normocephalic and atraumatic.  Eyes: Conjunctivae, EOM and lids are normal. Pupils are equal, round, and reactive to light.  Neck: Normal range of motion. Neck supple. No tracheal deviation present. No thyroid mass present.  Cardiovascular: Regular rhythm and normal heart sounds. Tachycardia present. Exam reveals no gallop.  No murmur heard. Pulmonary/Chest: Effort normal. No stridor. No respiratory distress. He has decreased breath sounds in the right lower field and the left lower field. He has no wheezes. He has no rhonchi. He has no rales.  Abdominal: Soft. Normal appearance and bowel sounds are normal. He exhibits no distension. There is no tenderness. There is no rebound and no CVA tenderness.  Musculoskeletal: Normal range of motion. He exhibits no edema or tenderness.  Neurological: He is alert and oriented to person, place, and time. He has normal strength. No cranial nerve deficit or sensory deficit. GCS eye subscore is 4. GCS verbal subscore is 5. GCS motor subscore is 6.  Skin: Skin is warm and dry. No abrasion and no rash noted.  Psychiatric: He has a normal mood and affect. His speech is normal and behavior is normal.  Nursing note and vitals reviewed.    ED Treatments / Results  Labs (all labs ordered are listed, but only abnormal results are displayed) Labs Reviewed  BASIC METABOLIC PANEL  CBC  D-DIMER, QUANTITATIVE (NOT AT [facility])  BRAIN NATRIURETIC PEPTIDE  I-STAT TROPONIN, ED    EKG  EKG Interpretation  Date/Time:  Sunday June 25 2017 09:16:14 EST Ventricular Rate:  120 PR Interval:  158 QRS  Duration: 92 QT Interval:  338 QTC Calculation: 477 R Axis:   -23 Text Interpretation:  Sinus tachycardia Left ventricular hypertrophy with repolarization abnormality Abnormal ECG Confirmed by Lorre NickAllen, [place] ([id number]) on 06/25/2017 9:40:32 AM       Radiology No results found.  Procedures Procedures (including critical care time)  Medications Ordered in ED Medications - No data to display   Initial Impression / Assessment and Plan / ED Course  I have reviewed the triage vital signs and the nursing notes.  Pertinent labs & imaging results that were available during my care of the patient were reviewed by me and considered in my medical decision making (see chart for details).     Patient presented initially short of  breath and concern for PE versus CHF.  D-dimer significantly elevated at 19.76.  Evidence of renal insufficiency and subsequently had a VQ scan which was negative for PE.  Does have elevated troponin and given aspirin.  He does not have any chest pain at this time.  Discussed with cardiology on call who will see patient in consultation.  Will admit to the hospital for further management.  Final Clinical Impressions(s) / ED Diagnoses   Final diagnoses:  None    ED Discharge Orders    None       Lorre NickAllen, Jimmy, MD 06/25/17 1407

## 2017-06-25 NOTE — Progress Notes (Addendum)
CRITICAL VALUE ALERT  Critical Value: Troponin 0.57  Date & Time Notied:  1623  Provider Notified: Dr. Adrian BlackwaterStinson  Orders Received/Actions taken: No orders added at this time.

## 2017-06-25 NOTE — ED Notes (Signed)
Patient transported to X-ray 

## 2017-06-25 NOTE — Progress Notes (Signed)
ANTICOAGULATION CONSULT NOTE   Pharmacy Consult for lovenox Indication: r/o DVT  Allergies  Allergen Reactions  . Cymbalta [Duloxetine Hcl] Other (See Comments)    Disoriented, bad feelings, doesn't seem to work for patient    Patient Measurements: Height: 6\' 3"  (190.5 cm) Weight: 263 lb 12.8 oz (119.7 kg) IBW/kg (Calculated) : 84.5   Vital Signs: Temp: 98.2 F (36.8 C) (12/16 1616) Temp Source: Oral (12/16 1616) BP: 137/92 (12/16 1616) Pulse Rate: 103 (12/16 1616)  Labs: Recent Labs    06/25/17 0936 06/25/17 1502  HGB 15.2  --   HCT 44.6  --   PLT 275  --   CREATININE 1.71*  --   TROPONINI  --  0.57*    Estimated Creatinine Clearance: 61.7 mL/min (A) (by C-G formula based on SCr of 1.71 mg/dL (H)).   Medical History: Past Medical History:  Diagnosis Date  . Chronic kidney disease   . Depression   . Diabetes mellitus without complication (HCC)   . Hyperlipidemia   . Hypertension   . Stroke [facility](HCC)     Medications:  Medications Prior to Admission  Medication Sig Dispense Refill Last Dose  . amLODipine (NORVASC) 5 MG tablet Take 5 mg by mouth daily.   06/25/2017 at Unknown time  . benazepril (LOTENSIN) 40 MG tablet TAKE ONE TABLET BY MOUTH ONCE DAILY 90 tablet 3 06/25/2017 at Unknown time  . carvedilol (COREG) 25 MG tablet Take 1 tablet (25 mg total) by mouth 2 (two) times daily. (Patient taking differently: Take 25 mg by mouth 2 (two) times daily. Patient taking one-half tablet (12.5mg )) 180 tablet 1 06/25/2017 at 0930  . diazepam (VALIUM) 2 MG tablet Take 1 tablet by mouth 3 (three) times daily.   06/24/2017  . glipiZIDE (GLUCOTROL) 5 MG tablet Take 1 tablet (5 mg total) by mouth daily before breakfast. 30 tablet 12 Past Month at Unknown time  . hydrochlorothiazide (HYDRODIURIL) 25 MG tablet TAKE ONE TABLET BY MOUTH ONCE DAILY 90 tablet 3 06/25/2017 at Unknown time  . clotrimazole (LOTRIMIN) 1 % cream Apply 1 application topically 2 (two) times daily. (Patient  not taking: Reported on 06/25/2017) 30 g 0 Not Taking at Unknown time  . glucose blood test strip Test blood glucose one to four times daily as directed. 100 each 12 Taking  . polyethylene glycol powder (GLYCOLAX/MIRALAX) powder Take 17 g by mouth 2 (two) times daily as needed. (Patient not taking: Reported on 06/25/2017) 3350 g 1 Not Taking at Unknown time  . Tdap (BOOSTRIX) 5-2.5-18.5 LF-MCG/0.5 injection Inject 0.5 mLs into the muscle once. 0.5 mL 0 Taking   Scheduled:  . [START ON 06/26/2017] amLODipine  5 mg Oral Daily  . carvedilol  25 mg Oral BID  . [START ON 06/26/2017] glipiZIDE  5 mg Oral QAC breakfast  . insulin aspart  0-15 Units Subcutaneous TID WC  . sodium chloride flush  3 mL Intravenous Q12H    Assessment: 61 yo male here with SOB and VQ with low probability of PE but D-dimer > 20. Pharmacy consulted for lovenox for r/o DVT -Hg= 15.2, SCr= 1.71 (close to baseline), CrCl ~ 60  Goal of Therapy:  Monitor platelets by anticoagulation protocol: Yes   Plan: -Lovenox 120mg  [place] q12h -CBC every 3 days  Harland GermanAndrew Monroe Toure, Pharm D 06/25/2017 5:03 PM

## 2017-06-26 ENCOUNTER — Observation Stay (HOSPITAL_COMMUNITY): Payer: Medicare Other

## 2017-06-26 DIAGNOSIS — E785 Hyperlipidemia, unspecified: Secondary | ICD-10-CM | POA: Diagnosis present

## 2017-06-26 DIAGNOSIS — F419 Anxiety disorder, unspecified: Secondary | ICD-10-CM | POA: Diagnosis present

## 2017-06-26 DIAGNOSIS — Z8249 Family history of ischemic heart disease and other diseases of the circulatory system: Secondary | ICD-10-CM | POA: Diagnosis not present

## 2017-06-26 DIAGNOSIS — R7989 Other specified abnormal findings of blood chemistry: Secondary | ICD-10-CM | POA: Diagnosis not present

## 2017-06-26 DIAGNOSIS — I82411 Acute embolism and thrombosis of right femoral vein: Secondary | ICD-10-CM | POA: Diagnosis present

## 2017-06-26 DIAGNOSIS — R748 Abnormal levels of other serum enzymes: Secondary | ICD-10-CM | POA: Diagnosis not present

## 2017-06-26 DIAGNOSIS — I82431 Acute embolism and thrombosis of right popliteal vein: Secondary | ICD-10-CM | POA: Diagnosis present

## 2017-06-26 DIAGNOSIS — Z841 Family history of disorders of kidney and ureter: Secondary | ICD-10-CM | POA: Diagnosis not present

## 2017-06-26 DIAGNOSIS — I82441 Acute embolism and thrombosis of right tibial vein: Secondary | ICD-10-CM | POA: Diagnosis present

## 2017-06-26 DIAGNOSIS — Z8673 Personal history of transient ischemic attack (TIA), and cerebral infarction without residual deficits: Secondary | ICD-10-CM | POA: Diagnosis not present

## 2017-06-26 DIAGNOSIS — I5032 Chronic diastolic (congestive) heart failure: Secondary | ICD-10-CM | POA: Diagnosis present

## 2017-06-26 DIAGNOSIS — I10 Essential (primary) hypertension: Secondary | ICD-10-CM | POA: Diagnosis not present

## 2017-06-26 DIAGNOSIS — Z7984 Long term (current) use of oral hypoglycemic drugs: Secondary | ICD-10-CM | POA: Diagnosis not present

## 2017-06-26 DIAGNOSIS — R0602 Shortness of breath: Secondary | ICD-10-CM | POA: Diagnosis not present

## 2017-06-26 DIAGNOSIS — N183 Chronic kidney disease, stage 3 (moderate): Secondary | ICD-10-CM

## 2017-06-26 DIAGNOSIS — R072 Precordial pain: Secondary | ICD-10-CM | POA: Diagnosis not present

## 2017-06-26 DIAGNOSIS — R000 Tachycardia, unspecified: Secondary | ICD-10-CM | POA: Diagnosis present

## 2017-06-26 DIAGNOSIS — E1122 Type 2 diabetes mellitus with diabetic chronic kidney disease: Secondary | ICD-10-CM | POA: Diagnosis present

## 2017-06-26 DIAGNOSIS — I214 Non-ST elevation (NSTEMI) myocardial infarction: Secondary | ICD-10-CM | POA: Diagnosis present

## 2017-06-26 DIAGNOSIS — R0902 Hypoxemia: Secondary | ICD-10-CM | POA: Diagnosis present

## 2017-06-26 DIAGNOSIS — I82403 Acute embolism and thrombosis of unspecified deep veins of lower extremity, bilateral: Secondary | ICD-10-CM | POA: Diagnosis not present

## 2017-06-26 DIAGNOSIS — I824Z3 Acute embolism and thrombosis of unspecified deep veins of distal lower extremity, bilateral: Secondary | ICD-10-CM | POA: Diagnosis present

## 2017-06-26 DIAGNOSIS — E871 Hypo-osmolality and hyponatremia: Secondary | ICD-10-CM | POA: Diagnosis present

## 2017-06-26 DIAGNOSIS — K219 Gastro-esophageal reflux disease without esophagitis: Secondary | ICD-10-CM | POA: Diagnosis present

## 2017-06-26 DIAGNOSIS — M7989 Other specified soft tissue disorders: Secondary | ICD-10-CM | POA: Diagnosis not present

## 2017-06-26 DIAGNOSIS — E119 Type 2 diabetes mellitus without complications: Secondary | ICD-10-CM | POA: Diagnosis not present

## 2017-06-26 DIAGNOSIS — Z833 Family history of diabetes mellitus: Secondary | ICD-10-CM | POA: Diagnosis not present

## 2017-06-26 DIAGNOSIS — I130 Hypertensive heart and chronic kidney disease with heart failure and stage 1 through stage 4 chronic kidney disease, or unspecified chronic kidney disease: Secondary | ICD-10-CM | POA: Diagnosis present

## 2017-06-26 DIAGNOSIS — I2699 Other pulmonary embolism without acute cor pulmonale: Secondary | ICD-10-CM | POA: Diagnosis present

## 2017-06-26 DIAGNOSIS — R0600 Dyspnea, unspecified: Secondary | ICD-10-CM | POA: Diagnosis not present

## 2017-06-26 LAB — HEPATIC FUNCTION PANEL
ALBUMIN: 3.3 g/dL — AB (ref 3.5–5.0)
ALK PHOS: 60 U/L (ref 38–126)
ALT: 36 U/L (ref 17–63)
AST: 42 U/L — ABNORMAL HIGH (ref 15–41)
BILIRUBIN INDIRECT: 0.7 mg/dL (ref 0.3–0.9)
BILIRUBIN TOTAL: 0.8 mg/dL (ref 0.3–1.2)
Bilirubin, Direct: 0.1 mg/dL (ref 0.1–0.5)
TOTAL PROTEIN: 9.1 g/dL — AB (ref 6.5–8.1)

## 2017-06-26 LAB — CBC
HCT: 40.9 % (ref 39.0–52.0)
Hemoglobin: 13.9 g/dL (ref 13.0–17.0)
MCH: 27.7 pg (ref 26.0–34.0)
MCHC: 34 g/dL (ref 30.0–36.0)
MCV: 81.5 fL (ref 78.0–100.0)
Platelets: 245 10*3/uL (ref 150–400)
RBC: 5.02 MIL/uL (ref 4.22–5.81)
RDW: 14.6 % (ref 11.5–15.5)
WBC: 9 10*3/uL (ref 4.0–10.5)

## 2017-06-26 LAB — BASIC METABOLIC PANEL
Anion gap: 9 (ref 5–15)
BUN: 23 mg/dL — AB (ref 6–20)
CALCIUM: 8.6 mg/dL — AB (ref 8.9–10.3)
CO2: 20 mmol/L — ABNORMAL LOW (ref 22–32)
CREATININE: 1.85 mg/dL — AB (ref 0.61–1.24)
Chloride: 101 mmol/L (ref 101–111)
GFR, EST AFRICAN AMERICAN: 43 mL/min — AB (ref >60)
GFR, EST NON AFRICAN AMERICAN: 37 mL/min — AB (ref >60)
Glucose, Bld: 224 mg/dL — ABNORMAL HIGH (ref 65–99)
Potassium: 3.9 mmol/L (ref 3.5–5.1)
SODIUM: 130 mmol/L — AB (ref 135–145)

## 2017-06-26 LAB — GLUCOSE, CAPILLARY
GLUCOSE-CAPILLARY: 124 mg/dL — AB (ref 65–99)
GLUCOSE-CAPILLARY: 152 mg/dL — AB (ref 65–99)
Glucose-Capillary: 215 mg/dL — ABNORMAL HIGH (ref 65–99)
Glucose-Capillary: 168 mg/dL — ABNORMAL HIGH (ref 65–99)

## 2017-06-26 LAB — HIV ANTIBODY (ROUTINE TESTING W REFLEX): HIV SCREEN 4TH GENERATION: NONREACTIVE

## 2017-06-26 LAB — TROPONIN I
TROPONIN I: 0.43 ng/mL — AB (ref <0.03)
Troponin I: 0.44 ng/mL (ref <0.03)

## 2017-06-26 MED ORDER — GUAIFENESIN-DM 100-10 MG/5ML PO SYRP
5.0000 mL | ORAL_SOLUTION | ORAL | Status: DC | PRN
  Administered 2017-06-26 – 2017-06-29 (×9): 5 mL via ORAL
  Filled 2017-06-26 (×9): qty 5

## 2017-06-26 NOTE — Progress Notes (Signed)
PROGRESS NOTE    Chris Manuelnthony T Cowell  ZOX:[id number] DOB: 1955/12/11 DOA: 06/25/2017 PCP: Ralene OkMoreira, Roy, MD   Outpatient Specialists:     Brief Narrative:  Chris Vaughn is a 61 y.o. male with a history of chronic kidney disease stage III, diabetes, anxiety, history of stroke, GERD.  Patient seen for shortness of breath that started yesterday it has been gradually worsening.  Shortness of breath worse with exertion and improved at rest.  Slight dry cough, no wheezing.  Had a brief episode of chest pain, which she described as burning and tightness, but denies chest pressure.  No other palliating or provoking factors.  Patient came to the emergency department for evaluation as pain has not been improving.  He admits to mild nausea, but no diaphoresis.      Assessment & Plan:   Active Problems:   Hyperlipidemia   CKD (chronic kidney disease) stage 3, GFR 30-59 ml/min (HCC)   Essential hypertension, benign   Type 2 diabetes mellitus without complication, without long-term current use of insulin (HCC)   SOB (shortness of breath)   Elevated troponin   Elevated d-dimer   DVT with presumed PE -will not get CTA as Cr elevated -heparin gtt -start PO in AM  Elevated troponin -suspect due to PE -echo pending  DM -SSI  CKD stage 3 -stable  SOB -due to presumed PE  Hyponatremia -unsure cause -BMP in AM     DVT prophylaxis:  Lovenox   Code Status: Full Code   Family Communication:   Disposition Plan:     Consultants:    Subjective: Has been fairly sedentary  Objective: Vitals:   06/26/17 0005 06/26/17 0656 06/26/17 1144 06/26/17 1625  BP: 109/80 119/86 (!) 118/92 109/85  Pulse: 100 (!) 102 100 95  Resp: 18 18    Temp:  97.9 F (36.6 C)    TempSrc:  Oral    SpO2: 97% 94% 97% 98%  Weight:  121.3 kg (267 lb 6.4 oz)    Height:        Intake/Output Summary (Last 24 hours) at 06/26/2017 1634 Last data filed at 06/26/2017 0827 Gross per 24  hour  Intake 100 ml  Output 150 ml  Net -50 ml   Filed Weights   06/25/17 0925 06/25/17 1616 06/26/17 0656  Weight: 122.5 kg (270 lb) 119.7 kg (263 lb 12.8 oz) 121.3 kg (267 lb 6.4 oz)    Examination:  General exam: Appears calm and comfortable  Respiratory system: Clear to auscultation. Respiratory effort normal. Cardiovascular system: S1 & S2 heard, RRR. No JVD, murmurs, rubs, gallops or clicks. No pedal edema. Gastrointestinal system: Abdomen is nondistended, soft and nontender. No organomegaly or masses felt. Normal bowel sounds heard. Central nervous system: Alert and oriented. No focal neurological deficits. Extremities: Symmetric 5 x 5 power. Skin: No rashes, lesions or ulcers Psychiatry: Judgement and insight appear normal. Mood & affect appropriate.     Data Reviewed: I have personally reviewed following labs and imaging studies  CBC: Recent Labs  Lab 06/25/17 0936 06/26/17 0319  WBC 8.2 9.0  HGB 15.2 13.9  HCT 44.6 40.9  MCV 82.6 81.5  PLT 275 245   Basic Metabolic Panel: Recent Labs  Lab 06/25/17 0936 06/26/17 0319  NA 132* 130*  K 3.7 3.9  CL 98* 101  CO2 23 20*  GLUCOSE 215* 224*  BUN 17 23*  CREATININE 1.71* 1.85*  CALCIUM 9.1 8.6*   GFR: Estimated Creatinine Clearance: 57.3 mL/min (A) (by  C-G formula based on SCr of 1.85 mg/dL (H)). Liver Function Tests: Recent Labs  Lab 06/26/17 0319  AST 42*  ALT 36  ALKPHOS 60  BILITOT 0.8  PROT 9.1*  ALBUMIN 3.3*   No results for input(s): LIPASE, AMYLASE in the last 168 hours. No results for input(s): AMMONIA in the last 168 hours. Coagulation Profile: No results for input(s): INR, PROTIME in the last 168 hours. Cardiac Enzymes: Recent Labs  Lab 06/25/17 1502 06/25/17 2217 06/26/17 0319 06/26/17 0800  TROPONINI 0.57* 0.46* 0.44* 0.43*   BNP (last 3 results) No results for input(s): PROBNP in the last 8760 hours. HbA1C: No results for input(s): HGBA1C in the last 72 hours. CBG: Recent  Labs  Lab 06/25/17 1628 06/25/17 2107 06/26/17 0746 06/26/17 1143 06/26/17 1628  GLUCAP 174* 220* 215* 168* 124*   Lipid Profile: No results for input(s): CHOL, HDL, LDLCALC, TRIG, CHOLHDL, LDLDIRECT in the last 72 hours. Thyroid Function Tests: No results for input(s): TSH, T4TOTAL, FREET4, T3FREE, THYROIDAB in the last 72 hours. Anemia Panel: No results for input(s): VITAMINB12, FOLATE, FERRITIN, TIBC, IRON, RETICCTPCT in the last 72 hours. Urine analysis:    Component Value Date/Time   COLORURINE YELLOW 06/27/2015 2024   APPEARANCEUR CLEAR 06/27/2015 2024   LABSPEC 1.005 06/27/2015 2024   PHURINE 6.5 06/27/2015 2024   GLUCOSEU 250 (A) 06/27/2015 2024   HGBUR SMALL (A) 06/27/2015 2024   BILIRUBINUR NEGATIVE 06/27/2015 2024   BILIRUBINUR SMALL 09/24/2013 1450   KETONESUR NEGATIVE 06/27/2015 2024   PROTEINUR NEGATIVE 06/27/2015 2024   UROBILINOGEN 0.2 10/16/2013 1242   NITRITE NEGATIVE 06/27/2015 2024   LEUKOCYTESUR NEGATIVE 06/27/2015 2024     )No results found for this or any previous visit (from the past 240 hour(s)).    Anti-infectives (From admission, onward)   None       Radiology Studies: Dg Chest 2 View  Result Date: 06/25/2017 CLINICAL DATA:  Shortness of breath.  Cough and dizziness. EXAM: CHEST  2 VIEW COMPARISON:  02/06/2016 FINDINGS: The heart size and mediastinal contours are within normal limits. Both lungs are clear. The visualized skeletal structures are unremarkable. IMPRESSION: No active cardiopulmonary disease. Electronically Signed   By: Signa Kellaylor  Stroud M.D.   On: 06/25/2017 11:12   Nm Pulmonary Vent And Perf (v/q Scan)  Result Date: 06/25/2017 CLINICAL DATA:  61 year old male with nonproductive cough, right lower extremity swelling, tachycardia. EXAM: NUCLEAR MEDICINE VENTILATION - PERFUSION LUNG SCAN TECHNIQUE: Ventilation images were obtained in multiple projections using inhaled aerosol Tc-6247m DTPA. Perfusion images were obtained in multiple  projections after intravenous injection of Tc-4347m MAA. RADIOPHARMACEUTICALS:  32.2 mCi Technetium-6047m DTPA aerosol inhalation and 4.3 mCi Technetium-6547m MAA IV COMPARISON:  Chest radiographs 0941 hours today. FINDINGS: Ventilation: Mildly asymmetric ventilation radiotracer distribution, more so into the left lung. Eventration or elevation of the hemidiaphragms again noted. No ventilation defect. Perfusion: More homogeneous radiotracer activity in both lungs. Smooth linear photopenic area present across the left lung. No other perfusion defect identified. IMPRESSION: Low probability of acute pulmonary embolus. Electronically Signed   By: Odessa FlemingH  Hall M.D.   On: 06/25/2017 13:23        Scheduled Meds: . amLODipine  5 mg Oral Daily  . carvedilol  25 mg Oral BID  . enoxaparin (LOVENOX) injection  120 mg Subcutaneous Q12H  . glipiZIDE  5 mg Oral QAC breakfast  . insulin aspart  0-15 Units Subcutaneous TID WC  . sodium chloride flush  3 mL Intravenous Q12H   Continuous  Infusions:   LOS: 0 days    Time spent: 35 min    Joseph Art, DO [facility] Pager [phone]  If 7PM-7AM, please contact night-coverage [email or web address] Password [facility] 06/26/2017, 4:34 PM

## 2017-06-26 NOTE — Plan of Care (Signed)
  Progressing Activity: Risk for activity intolerance will decrease 06/26/2017 0235 - Progressing by Agnes LawrenceLackey, Kari Kerth, RN Nutrition: Adequate nutrition will be maintained 06/26/2017 0235 - Progressing by Agnes LawrenceLackey, Joal Eakle, RN Pain Managment: General experience of comfort will improve 06/26/2017 0235 - Progressing by Agnes LawrenceLackey, Zaiyah Sottile, RN Safety: Ability to remain free from injury will improve 06/26/2017 0235 - Progressing by Agnes LawrenceLackey, May Manrique, RN Skin Integrity: Risk for impaired skin integrity will decrease 06/26/2017 0235 - Progressing by Agnes LawrenceLackey, Yariana Hoaglund, RN

## 2017-06-26 NOTE — Progress Notes (Signed)
VASCULAR LAB PRELIMINARY  PRELIMINARY  PRELIMINARY  PRELIMINARY  Bilateral lower extremity venous duplex completed.    Preliminary report:  There is acute DVT noted in the right mid femoral, popliteal, peroneal, and posterior tibial veins and acute DVT noted in the left peroneal vein.  Attempted to call results, RN did not answer.11:08 12/17   Chris Vaughn, RVT 06/26/2017, 11:05 AM

## 2017-06-26 NOTE — Progress Notes (Signed)
No message left behind for vascular lab results.   MD paged regarding vascular U/S results after reading vas. lab note.

## 2017-06-27 ENCOUNTER — Other Ambulatory Visit (HOSPITAL_COMMUNITY): Payer: Medicare Other

## 2017-06-27 LAB — BASIC METABOLIC PANEL
ANION GAP: 10 (ref 5–15)
BUN: 30 mg/dL — ABNORMAL HIGH (ref 6–20)
CHLORIDE: 99 mmol/L — AB (ref 101–111)
CO2: 24 mmol/L (ref 22–32)
Calcium: 8.8 mg/dL — ABNORMAL LOW (ref 8.9–10.3)
Creatinine, Ser: 2.1 mg/dL — ABNORMAL HIGH (ref 0.61–1.24)
GFR calc non Af Amer: 32 mL/min — ABNORMAL LOW (ref >60)
GFR, EST AFRICAN AMERICAN: 37 mL/min — AB (ref >60)
GLUCOSE: 155 mg/dL — AB (ref 65–99)
Potassium: 3.8 mmol/L (ref 3.5–5.1)
Sodium: 133 mmol/L — ABNORMAL LOW (ref 135–145)

## 2017-06-27 LAB — CBC
HCT: 40.5 % (ref 39.0–52.0)
HEMOGLOBIN: 13.6 g/dL (ref 13.0–17.0)
MCH: 27.5 pg (ref 26.0–34.0)
MCHC: 33.6 g/dL (ref 30.0–36.0)
MCV: 81.8 fL (ref 78.0–100.0)
Platelets: 220 10*3/uL (ref 150–400)
RBC: 4.95 MIL/uL (ref 4.22–5.81)
RDW: 14.8 % (ref 11.5–15.5)
WBC: 9.1 10*3/uL (ref 4.0–10.5)

## 2017-06-27 LAB — GLUCOSE, CAPILLARY
GLUCOSE-CAPILLARY: 164 mg/dL — AB (ref 65–99)
GLUCOSE-CAPILLARY: 147 mg/dL — AB (ref 65–99)
Glucose-Capillary: 133 mg/dL — ABNORMAL HIGH (ref 65–99)
Glucose-Capillary: 147 mg/dL — ABNORMAL HIGH (ref 65–99)
Glucose-Capillary: 98 mg/dL (ref 65–99)

## 2017-06-27 LAB — HEMOGLOBIN A1C
HEMOGLOBIN A1C: 7.6 % — AB (ref 4.8–5.6)
MEAN PLASMA GLUCOSE: 171 mg/dL

## 2017-06-27 NOTE — Progress Notes (Signed)
PROGRESS NOTE    Chris Manuelnthony T Whack  ZOX:[id number] DOB: 1956-01-27 DOA: 06/25/2017 PCP: Ralene OkMoreira, Roy, MD   Outpatient Specialists:     Brief Narrative:  Chris Vaughn is a 61 y.o. male with a history of chronic kidney disease stage III, diabetes, anxiety, history of stroke, GERD.  Patient seen for shortness of breath that started yesterday it has been gradually worsening.  Shortness of breath worse with exertion and improved at rest.  Slight dry cough, no wheezing.  Had a brief episode of chest pain, which she described as burning and tightness, but denies chest pressure.  No other palliating or provoking factors.  Patient came to the emergency department for evaluation as pain has not been improving.  He admits to mild nausea, but no diaphoresis.      Assessment & Plan:   Active Problems:   Hyperlipidemia   CKD (chronic kidney disease) stage 3, GFR 30-59 ml/min (HCC)   Essential hypertension, benign   Type 2 diabetes mellitus without complication, without long-term current use of insulin (HCC)   SOB (shortness of breath)   Elevated troponin   Elevated d-dimer   DVT with presumed PE -will not get CTA as Cr elevated -lovenox tx dose -start PO if Cr stable in AM -echo still pending  Elevated troponin -suspect due to PE -echo pending  DM -SSI  CKD stage 3 -trending up -repeat in AM  SOB -due to presumed PE -wean off O2  Hyponatremia -unsure cause -BMP in AM     DVT prophylaxis:  Lovenox   Code Status: Full Code   Family Communication:   Disposition Plan:     Consultants:    Subjective: Still very SOB when he gets up Under a lot of stress at home-- cares for son   Objective: Vitals:   06/26/17 2202 06/27/17 0629 06/27/17 0700 06/27/17 1155  BP:  (!) 142/96  120/79  Pulse:    91  Resp:  20  18  Temp:  98.8 F (37.1 C)  98.1 F (36.7 C)  TempSrc:  Oral  Oral  SpO2: 95% 93%  99%  Weight:   121.7 kg (268 lb 6.4 oz)   Height:         Intake/Output Summary (Last 24 hours) at 06/27/2017 1247 Last data filed at 06/27/2017 0908 Gross per 24 hour  Intake 895 ml  Output 400 ml  Net 495 ml   Filed Weights   06/25/17 1616 06/26/17 0656 06/27/17 0700  Weight: 119.7 kg (263 lb 12.8 oz) 121.3 kg (267 lb 6.4 oz) 121.7 kg (268 lb 6.4 oz)    Examination:  General exam: in bed, NAD Respiratory system: clear Cardiovascular system: rrr Gastrointestinal system: +BS, soft Central nervous system: alert- tearful at times Extremities: moves all 4 ext Skin: no rash     Data Reviewed: I have personally reviewed following labs and imaging studies  CBC: Recent Labs  Lab 06/25/17 0936 06/26/17 0319 06/27/17 0623  WBC 8.2 9.0 9.1  HGB 15.2 13.9 13.6  HCT 44.6 40.9 40.5  MCV 82.6 81.5 81.8  PLT 275 245 220   Basic Metabolic Panel: Recent Labs  Lab 06/25/17 0936 06/26/17 0319 06/27/17 0623  NA 132* 130* 133*  K 3.7 3.9 3.8  CL 98* 101 99*  CO2 23 20* 24  GLUCOSE 215* 224* 155*  BUN 17 23* 30*  CREATININE 1.71* 1.85* 2.10*  CALCIUM 9.1 8.6* 8.8*   GFR: Estimated Creatinine Clearance: 50.6 mL/min (A) (by C-G formula  based on SCr of 2.1 mg/dL (H)). Liver Function Tests: Recent Labs  Lab 06/26/17 0319  AST 42*  ALT 36  ALKPHOS 60  BILITOT 0.8  PROT 9.1*  ALBUMIN 3.3*   No results for input(s): LIPASE, AMYLASE in the last 168 hours. No results for input(s): AMMONIA in the last 168 hours. Coagulation Profile: No results for input(s): INR, PROTIME in the last 168 hours. Cardiac Enzymes: Recent Labs  Lab 06/25/17 1502 06/25/17 2217 06/26/17 0319 06/26/17 0800  TROPONINI 0.57* 0.46* 0.44* 0.43*   BNP (last 3 results) No results for input(s): PROBNP in the last 8760 hours. HbA1C: Recent Labs    06/26/17 0319  HGBA1C 7.6*   CBG: Recent Labs  Lab 06/26/17 1143 06/26/17 1628 06/26/17 2154 06/27/17 0734 06/27/17 1133  GLUCAP 168* 124* 152* 133* 164*   Lipid Profile: No results for  input(s): CHOL, HDL, LDLCALC, TRIG, CHOLHDL, LDLDIRECT in the last 72 hours. Thyroid Function Tests: No results for input(s): TSH, T4TOTAL, FREET4, T3FREE, THYROIDAB in the last 72 hours. Anemia Panel: No results for input(s): VITAMINB12, FOLATE, FERRITIN, TIBC, IRON, RETICCTPCT in the last 72 hours. Urine analysis:    Component Value Date/Time   COLORURINE YELLOW 06/27/2015 2024   APPEARANCEUR CLEAR 06/27/2015 2024   LABSPEC 1.005 06/27/2015 2024   PHURINE 6.5 06/27/2015 2024   GLUCOSEU 250 (A) 06/27/2015 2024   HGBUR SMALL (A) 06/27/2015 2024   BILIRUBINUR NEGATIVE 06/27/2015 2024   BILIRUBINUR SMALL 09/24/2013 1450   KETONESUR NEGATIVE 06/27/2015 2024   PROTEINUR NEGATIVE 06/27/2015 2024   UROBILINOGEN 0.2 10/16/2013 1242   NITRITE NEGATIVE 06/27/2015 2024   LEUKOCYTESUR NEGATIVE 06/27/2015 2024     )No results found for this or any previous visit (from the past 240 hour(s)).    Anti-infectives (From admission, onward)   None       Radiology Studies: Nm Pulmonary Vent And Perf (v/q Scan)  Result Date: 06/25/2017 CLINICAL DATA:  61 year old male with nonproductive cough, right lower extremity swelling, tachycardia. EXAM: NUCLEAR MEDICINE VENTILATION - PERFUSION LUNG SCAN TECHNIQUE: Ventilation images were obtained in multiple projections using inhaled aerosol Tc-6563m DTPA. Perfusion images were obtained in multiple projections after intravenous injection of Tc-863m MAA. RADIOPHARMACEUTICALS:  32.2 mCi Technetium-5463m DTPA aerosol inhalation and 4.3 mCi Technetium-1963m MAA IV COMPARISON:  Chest radiographs 0941 hours today. FINDINGS: Ventilation: Mildly asymmetric ventilation radiotracer distribution, more so into the left lung. Eventration or elevation of the hemidiaphragms again noted. No ventilation defect. Perfusion: More homogeneous radiotracer activity in both lungs. Smooth linear photopenic area present across the left lung. No other perfusion defect identified. IMPRESSION:  Low probability of acute pulmonary embolus. Electronically Signed   By: Odessa FlemingH  Hall M.D.   On: 06/25/2017 13:23        Scheduled Meds: . amLODipine  5 mg Oral Daily  . carvedilol  25 mg Oral BID  . enoxaparin (LOVENOX) injection  120 mg Subcutaneous Q12H  . glipiZIDE  5 mg Oral QAC breakfast  . insulin aspart  0-15 Units Subcutaneous TID WC  . sodium chloride flush  3 mL Intravenous Q12H   Continuous Infusions:   LOS: 1 day    Time spent: 35 min    Joseph ArtJessica U Mickey Esguerra, DO [facility] Pager [phone]  If 7PM-7AM, please contact night-coverage [email or web address] Password [email or web address] 06/27/2017, 12:47 PM

## 2017-06-27 NOTE — Progress Notes (Addendum)
Benefit check in progress for Eliquis/Xaralto. Pollyann KennedyKim Christy Ehrsam, RN [phone])   RE: Benefit Check  Received: Today  Message Contents  Mardene SayerGreenlee, Dora, CMA        # 7. [facility] # [id number] [phone]    1. ELIQUIS  5 MG BID  COVER- YES  CO-PAY- $ 4.00  TIER- 4 DRUG  PRIOR APPROVAL- NO   2. XARELTO 20 MG DAILY  COVER- NOT COVER  PRIOR APPROVAL- YES #  [id number] OPT- 2   PREFERRED PHARMACY : [facility]

## 2017-06-28 ENCOUNTER — Inpatient Hospital Stay (HOSPITAL_COMMUNITY): Payer: Medicare Other

## 2017-06-28 DIAGNOSIS — R0600 Dyspnea, unspecified: Secondary | ICD-10-CM

## 2017-06-28 DIAGNOSIS — R0902 Hypoxemia: Secondary | ICD-10-CM

## 2017-06-28 LAB — CBC
HCT: 40.3 % (ref 39.0–52.0)
Hemoglobin: 13.7 g/dL (ref 13.0–17.0)
MCH: 27.7 pg (ref 26.0–34.0)
MCHC: 34 g/dL (ref 30.0–36.0)
MCV: 81.6 fL (ref 78.0–100.0)
PLATELETS: 253 10*3/uL (ref 150–400)
RBC: 4.94 MIL/uL (ref 4.22–5.81)
RDW: 14.7 % (ref 11.5–15.5)
WBC: 9.1 10*3/uL (ref 4.0–10.5)

## 2017-06-28 LAB — GLUCOSE, CAPILLARY
GLUCOSE-CAPILLARY: 120 mg/dL — AB (ref 65–99)
GLUCOSE-CAPILLARY: 186 mg/dL — AB (ref 65–99)
GLUCOSE-CAPILLARY: 109 mg/dL — AB (ref 65–99)
GLUCOSE-CAPILLARY: 223 mg/dL — AB (ref 65–99)
GLUCOSE-CAPILLARY: 187 mg/dL — AB (ref 65–99)
Glucose-Capillary: 127 mg/dL — ABNORMAL HIGH (ref 65–99)

## 2017-06-28 LAB — BASIC METABOLIC PANEL
Anion gap: 10 (ref 5–15)
BUN: 26 mg/dL — AB (ref 6–20)
CHLORIDE: 102 mmol/L (ref 101–111)
CO2: 23 mmol/L (ref 22–32)
CREATININE: 1.69 mg/dL — AB (ref 0.61–1.24)
Calcium: 8.7 mg/dL — ABNORMAL LOW (ref 8.9–10.3)
GFR, EST AFRICAN AMERICAN: 48 mL/min — AB (ref >60)
GFR, EST NON AFRICAN AMERICAN: 41 mL/min — AB (ref >60)
Glucose, Bld: 132 mg/dL — ABNORMAL HIGH (ref 65–99)
Potassium: 3.5 mmol/L (ref 3.5–5.1)
Sodium: 135 mmol/L (ref 135–145)

## 2017-06-28 LAB — ECHOCARDIOGRAM COMPLETE
Height: 75 in
WEIGHTICAEL: 4292.8 [oz_av]

## 2017-06-28 MED ORDER — ALBUTEROL SULFATE (2.5 MG/3ML) 0.083% IN NEBU: 2.5000 mg | INHALATION_SOLUTION | RESPIRATORY_TRACT | Status: DC | PRN

## 2017-06-28 MED ORDER — APIXABAN 5 MG PO TABS
10.0000 mg | ORAL_TABLET | Freq: Two times a day (BID) | ORAL | Status: DC
  Administered 2017-06-28 – 2017-06-29 (×2): 10 mg via ORAL
  Filled 2017-06-28 (×2): qty 2

## 2017-06-28 MED ORDER — APIXABAN 5 MG PO TABS: 5.0000 mg | ORAL_TABLET | Freq: Two times a day (BID) | ORAL | Status: DC

## 2017-06-28 MED ORDER — BENAZEPRIL HCL 40 MG PO TABS
40.0000 mg | ORAL_TABLET | Freq: Every day | ORAL | Status: DC
  Administered 2017-06-29: 40 mg via ORAL
  Filled 2017-06-28 (×3): qty 1

## 2017-06-28 MED ORDER — PERFLUTREN LIPID MICROSPHERE
1.0000 mL | INTRAVENOUS | Status: AC | PRN
  Administered 2017-06-28: 2 mL via INTRAVENOUS
  Filled 2017-06-28: qty 10

## 2017-06-28 NOTE — Progress Notes (Signed)
SATURATION QUALIFICATIONS: (This note is used to comply with regulatory documentation for home oxygen)  Patient Saturations on Room Air at Rest = 91%  Patient Saturations on Room Air while Ambulating = 86%  Patient Saturations on 3 Liters of oxygen while Ambulating = 97%  Please briefly explain why patient needs home oxygen: patient has shortness of breath with ambulation.  Mivaan Corbitt Lynnell DikeN Raynaldo Falco

## 2017-06-28 NOTE — Progress Notes (Addendum)
  Echocardiogram 2D Echocardiogram with definity has been performed.  Technically difficult study due to patient respiratory rate.   Chris Vaughn 06/28/2017, 3:47 PM

## 2017-06-28 NOTE — Progress Notes (Signed)
PROGRESS NOTE    Chris Vaughn  ZOX:[id number] DOB: [birth year] DOA: 06/25/2017 PCP: Ralene OkMoreira, Roy, MD   Outpatient Specialists:     Brief Narrative:  Chris Vaughn is a 61 y.o. male with a history of chronic kidney disease stage III, diabetes, anxiety, history of stroke, GERD.  Patient seen for shortness of breath that started yesterday it has been gradually worsening.  Shortness of breath worse with exertion and improved at rest.  Slight dry cough, no wheezing.  Had a brief episode of chest pain, which she described as burning and tightness, but denies chest pressure.  V/Q was low risk but D dimer was elevated.  Duplex of LE showed b/l DVTs     Assessment & Plan:   Active Problems:   Hyperlipidemia   CKD (chronic kidney disease) stage 3, GFR 30-59 ml/min (HCC)   Essential hypertension, benign   Type 2 diabetes mellitus without complication, without long-term current use of insulin (HCC)   SOB (shortness of breath)   Elevated troponin   Elevated d-dimer   DVT with presumed PE -will not get CTA as Cr elevated -lovenox tx dose change to PO eliquis -echo still pending  Elevated troponin -suspect due to PE -echo pending- may require a cardiology consult  DM -SSI  CKD stage 3 -monitor  SOB -due to presumed PE -wean off O2 if able -will repeat x ray in AM   Hyponatremia -resolved     DVT prophylaxis:  eliquis   Code Status: Full Code   Family Communication: none  Disposition Plan:     Consultants:    Subjective: Thinks breathing has improved slightly No lower leg cramping   Objective: Vitals:   06/28/17 0505 06/28/17 1000 06/28/17 1056 06/28/17 1148  BP: (!) 138/96 (!) 146/94  109/74  Pulse: 94   82  Resp: 18   20  Temp: 98.2 F (36.8 C)   99 F (37.2 C)  TempSrc: Oral   Oral  SpO2: 95% 96% 93% 91%  Weight: 121.7 kg (268 lb 4.8 oz)     Height:        Intake/Output Summary (Last 24 hours) at 06/28/2017 1614 Last data  filed at 06/28/2017 1405 Gross per 24 hour  Intake 1050 ml  Output 802 ml  Net 248 ml   Filed Weights   06/26/17 0656 06/27/17 0700 06/28/17 0505  Weight: 121.3 kg (267 lb 6.4 oz) 121.7 kg (268 lb 6.4 oz) 121.7 kg (268 lb 4.8 oz)    Examination:  General exam: no increase work of breathing, appears depressed Respiratory system: no wheezing Cardiovascular system: rrr Gastrointestinal system: +BS, soft Central nervous system: A+OX3 Extremities: no LE edema Skin: no lesion     Data Reviewed: I have personally reviewed following labs and imaging studies  CBC: Recent Labs  Lab 06/25/17 0936 06/26/17 0319 06/27/17 0623 06/28/17 0448  WBC 8.2 9.0 9.1 9.1  HGB 15.2 13.9 13.6 13.7  HCT 44.6 40.9 40.5 40.3  MCV 82.6 81.5 81.8 81.6  PLT 275 245 220 253   Basic Metabolic Panel: Recent Labs  Lab 06/25/17 0936 06/26/17 0319 06/27/17 0623 06/28/17 0448  NA 132* 130* 133* 135  K 3.7 3.9 3.8 3.5  CL 98* 101 99* 102  CO2 23 20* 24 23  GLUCOSE 215* 224* 155* 132*  BUN 17 23* 30* 26*  CREATININE 1.71* 1.85* 2.10* 1.69*  CALCIUM 9.1 8.6* 8.8* 8.7*   GFR: Estimated Creatinine Clearance: 62.9 mL/min (A) (by C-G formula based  on SCr of 1.69 mg/dL (H)). Liver Function Tests: Recent Labs  Lab 06/26/17 0319  AST 42*  ALT 36  ALKPHOS 60  BILITOT 0.8  PROT 9.1*  ALBUMIN 3.3*   No results for input(s): LIPASE, AMYLASE in the last 168 hours. No results for input(s): AMMONIA in the last 168 hours. Coagulation Profile: No results for input(s): INR, PROTIME in the last 168 hours. Cardiac Enzymes: Recent Labs  Lab 06/25/17 1502 06/25/17 2217 06/26/17 0319 06/26/17 0800  TROPONINI 0.57* 0.46* 0.44* 0.43*   BNP (last 3 results) No results for input(s): PROBNP in the last 8760 hours. HbA1C: Recent Labs    06/26/17 0319  HGBA1C 7.6*   CBG: Recent Labs  Lab 06/27/17 2148 06/28/17 0504 06/28/17 0730 06/28/17 1128 06/28/17 1355  GLUCAP 98 120* 186* 109* 223*    Lipid Profile: No results for input(s): CHOL, HDL, LDLCALC, TRIG, CHOLHDL, LDLDIRECT in the last 72 hours. Thyroid Function Tests: No results for input(s): TSH, T4TOTAL, FREET4, T3FREE, THYROIDAB in the last 72 hours. Anemia Panel: No results for input(s): VITAMINB12, FOLATE, FERRITIN, TIBC, IRON, RETICCTPCT in the last 72 hours. Urine analysis:    Component Value Date/Time   COLORURINE YELLOW 06/27/2015 2024   APPEARANCEUR CLEAR 06/27/2015 2024   LABSPEC 1.005 06/27/2015 2024   PHURINE 6.5 06/27/2015 2024   GLUCOSEU 250 (A) 06/27/2015 2024   HGBUR SMALL (A) 06/27/2015 2024   BILIRUBINUR NEGATIVE 06/27/2015 2024   BILIRUBINUR SMALL 09/24/2013 1450   KETONESUR NEGATIVE 06/27/2015 2024   PROTEINUR NEGATIVE 06/27/2015 2024   UROBILINOGEN 0.2 10/16/2013 1242   NITRITE NEGATIVE 06/27/2015 2024   LEUKOCYTESUR NEGATIVE 06/27/2015 2024     )No results found for this or any previous visit (from the past 240 hour(s)).    Anti-infectives (From admission, onward)   None       Radiology Studies: No results found.      Scheduled Meds: . amLODipine  5 mg Oral Daily  . apixaban  10 mg Oral Q12H   Followed by  . [START ON 07/05/2017] apixaban  5 mg Oral Q12H  . benazepril  40 mg Oral Daily  . carvedilol  25 mg Oral BID  . glipiZIDE  5 mg Oral QAC breakfast  . insulin aspart  0-15 Units Subcutaneous TID WC  . sodium chloride flush  3 mL Intravenous Q12H   Continuous Infusions:   LOS: 2 days    Time spent: 35 min    Joseph ArtJessica U Latravia Southgate, DO [facility] Pager [phone]  If 7PM-7AM, please contact night-coverage [email or web address] Password [email or web address] 06/28/2017, 4:14 PM

## 2017-06-29 ENCOUNTER — Inpatient Hospital Stay (HOSPITAL_COMMUNITY): Payer: Medicare Other

## 2017-06-29 DIAGNOSIS — R7989 Other specified abnormal findings of blood chemistry: Secondary | ICD-10-CM

## 2017-06-29 DIAGNOSIS — R072 Precordial pain: Secondary | ICD-10-CM

## 2017-06-29 DIAGNOSIS — R0602 Shortness of breath: Secondary | ICD-10-CM

## 2017-06-29 DIAGNOSIS — I10 Essential (primary) hypertension: Secondary | ICD-10-CM

## 2017-06-29 DIAGNOSIS — R748 Abnormal levels of other serum enzymes: Secondary | ICD-10-CM

## 2017-06-29 DIAGNOSIS — E119 Type 2 diabetes mellitus without complications: Secondary | ICD-10-CM

## 2017-06-29 LAB — GLUCOSE, CAPILLARY
GLUCOSE-CAPILLARY: 154 mg/dL — AB (ref 65–99)
Glucose-Capillary: 188 mg/dL — ABNORMAL HIGH (ref 65–99)

## 2017-06-29 LAB — BASIC METABOLIC PANEL
ANION GAP: 9 (ref 5–15)
BUN: 19 mg/dL (ref 6–20)
CALCIUM: 8.8 mg/dL — AB (ref 8.9–10.3)
CO2: 25 mmol/L (ref 22–32)
Chloride: 100 mmol/L — ABNORMAL LOW (ref 101–111)
Creatinine, Ser: 1.56 mg/dL — ABNORMAL HIGH (ref 0.61–1.24)
GFR calc Af Amer: 53 mL/min — ABNORMAL LOW (ref >60)
GFR, EST NON AFRICAN AMERICAN: 46 mL/min — AB (ref >60)
GLUCOSE: 156 mg/dL — AB (ref 65–99)
Potassium: 3.8 mmol/L (ref 3.5–5.1)
Sodium: 134 mmol/L — ABNORMAL LOW (ref 135–145)

## 2017-06-29 LAB — CBC
HCT: 40.6 % (ref 39.0–52.0)
HEMOGLOBIN: 13.8 g/dL (ref 13.0–17.0)
MCH: 28 pg (ref 26.0–34.0)
MCHC: 34 g/dL (ref 30.0–36.0)
MCV: 82.4 fL (ref 78.0–100.0)
PLATELETS: 268 10*3/uL (ref 150–400)
RBC: 4.93 MIL/uL (ref 4.22–5.81)
RDW: 14.7 % (ref 11.5–15.5)
WBC: 7.8 10*3/uL (ref 4.0–10.5)

## 2017-06-29 MED ORDER — APIXABAN 5 MG PO TABS: 5.0000 mg | ORAL_TABLET | Freq: Two times a day (BID) | ORAL | 0 refills | Status: AC

## 2017-06-29 MED ORDER — APIXABAN 5 MG PO TABS: 5.0000 mg | ORAL_TABLET | Freq: Two times a day (BID) | ORAL | 0 refills | Status: DC

## 2017-06-29 NOTE — Discharge Instructions (Signed)
Information on my medicine - ELIQUIS (apixaban)  Why was Eliquis prescribed for you? Eliquis was prescribed to treat blood clots that may have been found in the veins of your legs (deep vein thrombosis) or in your lungs (pulmonary embolism) and to reduce the risk of them occurring again.  What do You need to know about Eliquis ? The starting dose is 10 mg (two 5 mg tablets) taken TWICE daily for the FIRST SEVEN (7) DAYS, then on (enter date) 07/05/2017 in the afternoon the dose is reduced to ONE 5 mg tablet taken TWICE daily.  Eliquis may be taken with or without food.   Try to take the dose about the same time in the morning and in the evening. If you have difficulty swallowing the tablet whole please discuss with your pharmacist how to take the medication safely.  Take Eliquis exactly as prescribed and DO NOT stop taking Eliquis without talking to the doctor who prescribed the medication.  Stopping may increase your risk of developing a new blood clot.  Refill your prescription before you run out.  After discharge, you should have regular check-up appointments with your healthcare provider that is prescribing your Eliquis.    What do you do if you miss a dose? If a dose of ELIQUIS is not taken at the scheduled time, take it as soon as possible on the same day and twice-daily administration should be resumed. The dose should not be doubled to make up for a missed dose.  Important Safety Information A possible side effect of Eliquis is bleeding. You should call your healthcare provider right away if you experience any of the following: ? Bleeding from an injury or your nose that does not stop. ? Unusual colored urine (red or dark brown) or unusual colored stools (red or black). ? Unusual bruising for unknown reasons. ? A serious fall or if you hit your head (even if there is no bleeding).  Some medicines may interact with Eliquis and might increase your risk of bleeding or clotting  while on Eliquis. To help avoid this, consult your healthcare provider or pharmacist prior to using any new prescription or non-prescription medications, including herbals, vitamins, non-steroidal anti-inflammatory drugs (NSAIDs) and supplements.  This website has more information on Eliquis (apixaban): http://[email or web address].com/eliquis/home

## 2017-06-29 NOTE — Progress Notes (Signed)
[facility] PROGRESS NOTE  Chris Vaughn UJW:[id number] DOB: Dec 19, 1955 DOA: 06/25/2017 PCP: Ralene OkMoreira, Roy, MD  Brief summary   Chris Vaughn a 61 y.o.malewith a history of chronic kidney disease stage III, diabetes, anxiety, history of stroke, GERD. Patient seen for shortness of breath that started yesterday it has been gradually worsening. Shortness of breath worse with exertion and improved at rest. Slight dry cough, no wheezing. Had a brief episode of chest pain, which she described as burning and tightness, but denies chest pressure. V/Q was low risk but D dimer was elevated.  Duplex of LE showed b/l DVTs    Assessment/Plan:   Active Problems:   Hyperlipidemia   CKD (chronic kidney disease) stage 3, GFR 30-59 ml/min (HCC)   Essential hypertension, benign   Type 2 diabetes mellitus without complication, without long-term current use of insulin (HCC)   SOB (shortness of breath)   Elevated troponin   Elevated d-dimer   DVT. With questionable PE. However, VQ showed low probability of acute pulmonary embolus. Can't obtain CTA as Cr elevated. Received lovenox tx dose then changed to PO eliquis  Elevated troponin. Reported exertional cardiopulmonary symptoms at home. No recurrence. questionable due to PE but vq with low prob. -patient with risk factors for coronary artery disease.  Echo. LVEF 55-60%. Right ventricle: The cavity size was mildly dilated. EGC- high r wave v2, but not new. consulted cardiology consultation for stress test.   DM. Monitor on SSI  CKD stage 3. monitor  SOB. Acute hypoxia. Unclear etiology. Questionable due to presumed PE. Pulmonary exam is unremarkable. Still on low flow 2lpm oxygen. Will try to wean from oxygen.   Hyponatremia. resolved    Code Status: full Family Communication: d/w patient, RN (indicate person spoken with, relationship, and if by phone, the number) Disposition Plan: home 24-48 hrs     Consultants:  Cardiology   Procedures:  Echo   Antibiotics:  none (indicate start date, and stop date if known)  HPI/Subjective: Alert. Reports feeling better. Still requiring some low flow oxygen  Objective: Vitals:   06/28/17 2213 06/29/17 0612  BP: 118/85 (!) 154/108  Pulse: 90 90  Resp:  20  Temp:  98.3 F (36.8 C)  SpO2:  90%    Intake/Output Summary (Last 24 hours) at 06/29/2017 0754 Last data filed at 06/29/2017 0557 Gross per 24 hour  Intake 1170 ml  Output 802 ml  Net 368 ml   Filed Weights   06/27/17 0700 06/28/17 0505 06/29/17 0612  Weight: 121.7 kg (268 lb 6.4 oz) 121.7 kg (268 lb 4.8 oz) 123.6 kg (272 lb 8 oz)    Exam:   General:  Alert. No distress   Cardiovascular: s1,s2 rrr  Respiratory: CTA BL  Abdomen: soft, nt nd   Musculoskeletal: no leg edema    Data Reviewed: Basic Metabolic Panel: Recent Labs  Lab 06/25/17 0936 06/26/17 0319 06/27/17 0623 06/28/17 0448  NA 132* 130* 133* 135  K 3.7 3.9 3.8 3.5  CL 98* 101 99* 102  CO2 23 20* 24 23  GLUCOSE 215* 224* 155* 132*  BUN 17 23* 30* 26*  CREATININE 1.71* 1.85* 2.10* 1.69*  CALCIUM 9.1 8.6* 8.8* 8.7*   Liver Function Tests: Recent Labs  Lab 06/26/17 0319  AST 42*  ALT 36  ALKPHOS 60  BILITOT 0.8  PROT 9.1*  ALBUMIN 3.3*   No results for input(s): LIPASE, AMYLASE in the last 168 hours. No results for input(s): AMMONIA in the last 168  hours. CBC: Recent Labs  Lab 06/25/17 0936 06/26/17 0319 06/27/17 0623 06/28/17 0448  WBC 8.2 9.0 9.1 9.1  HGB 15.2 13.9 13.6 13.7  HCT 44.6 40.9 40.5 40.3  MCV 82.6 81.5 81.8 81.6  PLT 275 245 220 253   Cardiac Enzymes: Recent Labs  Lab 06/25/17 1502 06/25/17 2217 06/26/17 0319 06/26/17 0800  TROPONINI 0.57* 0.46* 0.44* 0.43*   BNP (last 3 results) Recent Labs    06/25/17 0936  BNP 336.0*    ProBNP (last 3 results) No results for input(s): PROBNP in the last 8760 hours.  CBG: Recent Labs  Lab  06/28/17 1128 06/28/17 1355 06/28/17 1636 06/28/17 2123 06/29/17 0739  GLUCAP 109* 223* 127* 187* 154*    No results found for this or any previous visit (from the past 240 hour(s)).   Studies: No results found.  Scheduled Meds: . amLODipine  5 mg Oral Daily  . apixaban  10 mg Oral Q12H   Followed by  . [START ON 07/05/2017] apixaban  5 mg Oral Q12H  . benazepril  40 mg Oral Daily  . carvedilol  25 mg Oral BID  . glipiZIDE  5 mg Oral QAC breakfast  . insulin aspart  0-15 Units Subcutaneous TID WC  . sodium chloride flush  3 mL Intravenous Q12H   Continuous Infusions:  Active Problems:   Hyperlipidemia   CKD (chronic kidney disease) stage 3, GFR 30-59 ml/min (HCC)   Essential hypertension, benign   Type 2 diabetes mellitus without complication, without long-term current use of insulin (HCC)   SOB (shortness of breath)   Elevated troponin   Elevated d-dimer    Time spent: >35 minutes     Chris Vaughn, [name]  [facility] Pager [phone]. If 7PM-7AM, please contact night-coverage at [email or web address], password [email or web address] 06/29/2017, 7:54 AM  LOS: 3 days

## 2017-06-29 NOTE — Consult Note (Signed)
Cardiology Consultation:   Patient ID: Chris Vaughn; [id number]; 08/15/1955   Admit date: 06/25/2017 Date of Consult: 06/29/2017  Primary Care Provider: Ralene Ok, MD Primary Cardiologist: Chris Vaughn Primary Electrophysiologist:     Patient Profile:   Chris Vaughn is a 61 y.o. male with a hx of CKD stage III, DM, anxiety, hx of stroke (2015), GERD, HTN, HLD, and depression who is being seen today for the evaluation of chest discomfort at the request of Dr Chris Vaughn.  History of Present Illness:   Chris Vaughn is known to this service and last saw Chris Vaughn 05/26/16. At that time, pt complained of intermittent chest burning that resolved spontaneously. He denied radiation of pain and associated symptoms. Chris Vaughn ordered a stress test, but this was not completed. The pt is not very active 2/ generalized weakness following his stroke in 2015. 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 He was started on ASA 81 mg and high intensity statin. The patient never followed up with Chris Vaughn because of insurance coverage. He also did not establish with another cardiologist.   He states that on Sat 06/24/17, he started feeling short of breath. On Sunday 06/25/17, he experienced chest burning without radiation and with shortness of breath, no other associated symptoms. He reported to the [facility] for further evaluation. He states he has not had another bout of chest burning since being admitted. He walked in the hall with PT today and states he had some chest discomfort and became hypoxic. SOB and chest discomfort were both relieved with placement of [place] O2.   D-dimer was elevated on admission and he was found to have a DVT, VQ scan with low probability for PE. Troponins 0.57 --> 0.46 --> 0.44 --> 0.43. EGK with ST.   Past Medical History:  Diagnosis Date  . Chronic kidney disease   . Depression   . Diabetes mellitus without complication (HCC)   .  Hyperlipidemia   . Hypertension   . Stroke [facility])     Past Surgical History:  Procedure Laterality Date  . BACK SURGERY       Home Medications:  Prior to Admission medications   Medication Sig Start Date End Date Taking? Authorizing Provider  amLODipine (NORVASC) 5 MG tablet Take 5 mg by mouth daily. 06/20/17  Yes [provider]  benazepril (LOTENSIN) 40 MG tablet TAKE ONE TABLET BY MOUTH ONCE DAILY 05/16/16  Yes Chris Vaughn  carvedilol (COREG) 25 MG tablet Take 1 tablet (25 mg total) by mouth 2 (two) times daily. Patient taking differently: Take 25 mg by mouth 2 (two) times daily. Patient taking one-half tablet (12.5mg ) 05/26/16 06/25/17 Yes End, Chris Deer, MD  diazepam (VALIUM) 2 MG tablet Take 1 tablet by mouth 3 (three) times daily. 09/01/15  Yes Chris Frame, MD  glipiZIDE (GLUCOTROL) 5 MG tablet Take 1 tablet (5 mg total) by mouth daily before breakfast. 05/09/16  Yes Chris Vaughn  hydrochlorothiazide (HYDRODIURIL) 25 MG tablet TAKE ONE TABLET BY MOUTH ONCE DAILY 08/11/16  Yes Chris Vaughn  clotrimazole (LOTRIMIN) 1 % cream Apply 1 application topically 2 (two) times daily. Patient not taking: Reported on 06/25/2017 07/10/15   Chris Vaughn  glucose blood test strip Test blood glucose one to four times daily as directed. 04/01/16   Chris Vaughn  polyethylene glycol powder (GLYCOLAX/MIRALAX) powder Take 17 g by mouth 2 (two) times  daily as needed. Patient not taking: Reported on 06/25/2017 07/10/15   Chris Vaughn  Tdap (BOOSTRIX) 5-2.5-18.5 LF-MCG/0.5 injection Inject 0.5 mLs into the muscle once. 07/10/15   Chris Vaughn    Inpatient Medications: Scheduled Meds: . amLODipine  5 mg Oral Daily  . apixaban  10 mg Oral Q12H   Followed by  . [START ON 07/05/2017] apixaban  5 mg Oral Q12H  . benazepril  40 mg Oral Daily  . carvedilol  25 mg Oral BID  . glipiZIDE  5 mg Oral QAC breakfast  .  insulin aspart  0-15 Units Subcutaneous TID WC  . sodium chloride flush  3 mL Intravenous Q12H   Continuous Infusions:  PRN Meds: acetaminophen **OR** acetaminophen, albuterol, diazepam, guaiFENesin-dextromethorphan, ondansetron **OR** ondansetron (ZOFRAN) IV, senna-docusate  Allergies:    Allergies  Allergen Reactions  . Cymbalta [Duloxetine Hcl] Other (See Comments)    Disoriented, bad feelings, doesn't seem to work for patient    Social History:   Social History   Socioeconomic History  . Marital status: Married    Spouse name: Not on file  . Number of children: Not on file  . Years of education: Not on file  . Highest education level: Not on file  Social Needs  . Financial resource strain: Not on file  . Food insecurity - worry: Not on file  . Food insecurity - inability: Not on file  . Transportation needs - medical: Not on file  . Transportation needs - non-medical: Not on file  Occupational History  . Not on file  Tobacco Use  . Smoking status: Never Smoker  . Smokeless tobacco: Never Used  Substance and Sexual Activity  . Alcohol use: No  . Drug use: No  . Sexual activity: Not on file  Other Topics Concern  . Not on file  Social History Narrative  . Not on file    Family History:    Family History  Problem Relation Age of Onset  . Diabetes Mother   . Heart disease Mother   . Kidney disease Mother   . Hypertension Father   . Heart disease Brother      ROS:  Please see the history of present illness.  ROS  All other ROS reviewed and negative.     Physical Exam/Data:   Vitals:   06/28/17 1944 06/28/17 2213 06/29/17 0612 06/29/17 0830  BP: (!) 136/94 118/85 (!) 154/108 (!) 148/100  Pulse: 93 90 90 87  Resp: 20  20   Temp: 98.6 F (37 C)  98.3 F (36.8 C)   TempSrc: Oral  Oral   SpO2: 95%  90% 98%  Weight:   272 lb 8 oz (123.6 kg)   Height:        Intake/Output Summary (Last 24 hours) at 06/29/2017 0911 Last data filed at 06/29/2017  [id number] Gross per 24 hour  Intake 933 ml  Output 801 ml  Net 132 ml   Filed Weights   06/27/17 0700 06/28/17 0505 06/29/17 0612  Weight: 268 lb 6.4 oz (121.7 kg) 268 lb 4.8 oz (121.7 kg) 272 lb 8 oz (123.6 kg)   Body mass index is 34.06 kg/Vaughn.  General:  Well nourished, well developed, in no acute distress HEENT: normal Neck: no JVD Vascular: No carotid bruits Cardiac:  normal S1, S2; RRR; no murmur Lungs:  clear to auscultation bilaterally, no wheezing, rhonchi or rales  Abd: soft, nontender, no hepatomegaly  Ext: no edema Musculoskeletal:  No  deformities, BUE and BLE strength normal and equal Skin: warm and dry  Neuro:  CNs 2-12 intact, no focal abnormalities noted Psych:  Normal affect   EKG:  The EKG was personally reviewed and demonstrates:  Sinus tachycardia Telemetry:  Telemetry was personally reviewed and demonstrates:  sinus  Relevant CV Studies:  Echo 06/28/17: Study Conclusions - Left ventricle: The cavity size was normal. Wall thickness was   increased in a pattern of mild LVH. Systolic function was normal.   The estimated ejection fraction was in the range of 55% to 60%.   Doppler parameters are consistent with abnormal left ventricular   relaxation (grade 1 diastolic dysfunction). - Aortic valve: Poorly visualized likely tri leaflet. Valve area   (VTI): 2.95 cm^2. Valve area (Vmax): 3.08 cm^2. Valve area   (Vmean): 3.11 cm^2. - Right ventricle: The cavity size was mildly dilated.  Laboratory Data:  Chemistry Recent Labs  Lab 06/26/17 0319 06/27/17 0623 06/28/17 0448  NA 130* 133* 135  K 3.9 3.8 3.5  CL 101 99* 102  CO2 20* 24 23  GLUCOSE 224* 155* 132*  BUN 23* 30* 26*  CREATININE 1.85* 2.10* 1.69*  CALCIUM 8.6* 8.8* 8.7*  GFRNONAA 37* 32* 41*  GFRAA 43* 37* 48*  ANIONGAP 9 10 10     Recent Labs  Lab 06/26/17 0319  PROT 9.1*  ALBUMIN 3.3*  AST 42*  ALT 36  ALKPHOS 60  BILITOT 0.8   Hematology Recent Labs  Lab 06/26/17 0319  06/27/17 0623 06/28/17 0448  WBC 9.0 9.1 9.1  RBC 5.02 4.95 4.94  HGB 13.9 13.6 13.7  HCT 40.9 40.5 40.3  MCV 81.5 81.8 81.6  MCH 27.7 27.5 27.7  MCHC 34.0 33.6 34.0  RDW 14.6 14.8 14.7  PLT 245 220 253   Cardiac Enzymes Recent Labs  Lab 06/25/17 1502 06/25/17 2217 06/26/17 0319 06/26/17 0800  TROPONINI 0.57* 0.46* 0.44* 0.43*    Recent Labs  Lab 06/25/17 0946  TROPIPOC 0.78*    BNP Recent Labs  Lab 06/25/17 0936  BNP 336.0*    DDimer  Recent Labs  Lab 06/25/17 0936 06/25/17 1520  DDIMER 19.76* >20.00*    Radiology/Studies:  Dg Chest 2 View  Result Date: 06/29/2017 CLINICAL DATA:  Hypoxia, diabetes, hypertension EXAM: CHEST  2 VIEW COMPARISON:  06/25/2017 FINDINGS: Minor basilar atelectasis. No significant airspace process, pneumonia, collapse or consolidation. Negative for edema, effusion or pneumothorax. Trachea is midline. Normal heart size and vascularity. Degenerative changes noted of the spine. IMPRESSION: Slight increased basilar atelectasis.  No other acute process. Electronically Signed   By: Judie Petit.  Shick Vaughn.D.   On: 06/29/2017 08:29   Dg Chest 2 View  Result Date: 06/25/2017 CLINICAL DATA:  Shortness of breath.  Cough and dizziness. EXAM: CHEST  2 VIEW COMPARISON:  02/06/2016 FINDINGS: The heart size and mediastinal contours are within normal limits. Both lungs are clear. The visualized skeletal structures are unremarkable. IMPRESSION: No active cardiopulmonary disease. Electronically Signed   By: Signa Kell Vaughn.D.   On: 06/25/2017 11:12   Nm Pulmonary Vent And Perf (v/q Scan)  Result Date: 06/25/2017 CLINICAL DATA:  61 year old male with nonproductive cough, right lower extremity swelling, tachycardia. EXAM: NUCLEAR MEDICINE VENTILATION - PERFUSION LUNG SCAN TECHNIQUE: Ventilation images were obtained in multiple projections using inhaled aerosol Tc-40m DTPA. Perfusion images were obtained in multiple projections after intravenous injection of Tc-1m  MAA. RADIOPHARMACEUTICALS:  32.2 mCi Technetium-19m DTPA aerosol inhalation and 4.3 mCi Technetium-13m MAA IV COMPARISON:  Chest radiographs  0941 hours today. FINDINGS: Ventilation: Mildly asymmetric ventilation radiotracer distribution, more so into the left lung. Eventration or elevation of the hemidiaphragms again noted. No ventilation defect. Perfusion: More homogeneous radiotracer activity in both lungs. Smooth linear photopenic area present across the left lung. No other perfusion defect identified. IMPRESSION: Low probability of acute pulmonary embolus. Electronically Signed   By: Odessa FlemingH  Hall Vaughn.D.   On: 06/25/2017 13:23    Assessment and Plan:   1. Chest pain, elevated troponin - Troponins 0.57 --> 0.46 --> 0.44 --> 0.43 - EKG without signs of acute ischemia - He has risk factors for ACS, including HTN, HLD, DM, and obesity. His chest pain sounds respiratory in nature as it occurred when he was walking and became hypoxic, which was relieved with oxygen supplementation. Will discuss with attending ischemic evaluation as OP after recovering from presumed PE.   2. Dyspnea - echo yesterday with normal EF and mild LVH, grade 1 DD - he denies lower extremity swelling - dyspnea, hypoxia, tachycardia, and chest pain are likely related to presumed PE   3. CKD stage III - sCr 1.69 (1.69), baseline appears to be 1.77   4. HTN - pressures 110-150s - continue to follow - continue norvasc, coreg, benazepril   5. DVT, suspected PE - on eliquis treatment  - D-dimer was > 20000    6. Chronic diastolic heart failure - pt may benefit from gentle lasix, given his respiratory status, although CXR without congestion or edema    For questions or updates, please contact [facility] Please consult [email or web address] for contact info under Cardiology/STEMI.   Signed, Roe Rutherfordngela Nicole Duke, PA  06/29/2017 9:11 AM  As above, patient seen and examined.  Briefly he is a 61 year old male with past medical  history of diabetes mellitus, hypertension, hyperlipidemia, prior CVA, chronic stage III kidney disease for evaluation of chest pain.  Patient states he typically does not have dyspnea on exertion or exertional chest pain.  [place] to take his son to physician appointments as he has had previous transplant.  Saturday he developed sudden onset of dyspnea and chest tightness.  The pain was not pleuritic and did not radiate.  He came to the hospital on Sunday to be evaluated.  He states his dyspnea on exertion has improved.  There is no orthopnea, PND, pedal edema and no further chest pain.  Troponin elevated at 0.78.  D-dimer 19.76.  Electrocardiogram showed sinus tachycardia with left ventricular hypertrophy.  Lower extremity venous Dopplers showed DVTs.  VQ scan low probability.  1 chest pain/dyspnea-patient presented with acute onset of dyspnea and chest pain.  His clinical presentation, laboratories and lower extremity ultrasound all support pulmonary embolus.  He has risk factor of [place] [facility] [place].  His d-dimer is markedly elevated.  His lower extremity venous Dopplers show DVT.  Minimal elevation in troponin also consistent with DVT.  There was note of sinus tachycardia on his ECG.  Echocardiogram also showed mild right ventricular enlargement.  Despite his VQ scan being low probability I agree that pulmonary embolus is the cause of his presentation.  I agree with anticoagulation.  I would not pursue CTA given baseline renal insufficiency.  I Vaughn not think his presentation is consistent with an acute coronary syndrome.  Given his risk factors we can plan an outpatient functional study in 3-6 months.  2 elevated troponin-likely secondary to a combination of pulmonary embolus and baseline renal insufficiency.  No clear trend.  3 hypertension-continue present medications and adjust regimen as needed.  4 hyperlipidemia-given baseline diabetes  mellitus patient should likely be on a statin long-term.  I will leave this to primary care.  5 diabetes mellitus-Management per primary service\  6 chronic stage 3 kidney disease-will avoid CTA  We will sign off; please call with questions; fu Dr End following DC for cardiac issues.  Olga Millers, MD

## 2017-06-29 NOTE — Discharge Summary (Signed)
Physician Discharge Summary  Chris Vaughn ZOX:[id number] DOB: May 23, 1956 DOA: 06/25/2017  PCP: Ralene OkMoreira, Roy, MD  Admit date: 06/25/2017 Discharge date: 06/29/2017  Time spent:>35 minutes  Recommendations for Outpatient Follow-up:  PCP in 3-7 days Cardiology in 2-3 weeks  Discharge Diagnoses:  Active Problems:   Hyperlipidemia   CKD (chronic kidney disease) stage 3, GFR 30-59 ml/min (HCC)   Essential hypertension, benign   Type 2 diabetes mellitus without complication, without long-term current use of insulin (HCC)   SOB (shortness of breath)   Elevated troponin   Elevated d-dimer   Discharge Condition: stable   Diet recommendation: low sodium. Carb modified   Filed Weights   06/27/17 0700 06/28/17 0505 06/29/17 0612  Weight: 121.7 kg (268 lb 6.4 oz) 121.7 kg (268 lb 4.8 oz) 123.6 kg (272 lb 8 oz)    History of present illness:   Chris Vaughn a 61 y.o.malewith a history of chronic kidney disease stage III, diabetes, anxiety, history of stroke, GERD. Patient seen for shortness of breath that started yesterday it has been gradually worsening. Shortness of breath worse with exertion and improved at rest. Slight dry cough, no wheezing. Had a brief episode of chest pain, which she described as burning and tightness, but denies chest pressure.V/Q was low risk but D dimer was elevated. Duplex of LE showed b/l DVTs    Hospital Course:   Active Problems: Hyperlipidemia CKD (chronic kidney disease) stage 3, GFR 30-59 ml/min (HCC) Essential hypertension, benign Type 2 diabetes mellitus without complication, without long-term current use of insulin (HCC) SOB (shortness of breath) Elevated troponin Elevated d-dimer   DVT. With questionable PE. However, VQ showed low probability of acute pulmonary embolus. Can't obtain CTA as Cr elevated. Received lovenox tx dosethen changed to PO eliquis. Recommend to f/u with PCP next week to repeat  labs, document stability of renal function while on apixaban   Elevated troponin. Reported exertional cardiopulmonary symptoms at home. No recurrence. questionable due to PE but vq with low prob. Echo. LVEF 55-60%. Right ventricle: The cavity size was mildly dilated. EGC- high r wave v2, but not new. consulted cardiology consultation who recommended outpatient stress test.   DM. Stable  CKD stage 3. Stable  SOB. Acute hypoxia. Unclear etiology. Questionable due to presumed PE. Pulmonary exam is unremarkable. Required low flow 2lpm oxygen.  Hyponatremia. resolved    Procedures:  Echo  (i.e. Studies not automatically included, echos, thoracentesis, etc; not x-rays)  Consultations:  Cardiology   Discharge Exam: Vitals:   06/29/17 1109 06/29/17 1210  BP: 119/85 112/77  Pulse:  81  Resp:    Temp:    SpO2: 94% 92%    General: alert. No distress  Cardiovascular: s1,s2 rrr Respiratory: CTA BL  Discharge Instructions  Discharge Instructions    Diet - low sodium heart healthy   Complete by:  As directed    Increase activity slowly   Complete by:  As directed      Allergies as of 06/29/2017      Reactions   Cymbalta [duloxetine Hcl] Other (See Comments)   Disoriented, bad feelings, doesn't seem to work for patient      Medication List    STOP taking these medications   Tdap 5-2.5-18.5 LF-MCG/0.5 injection Commonly known as:  BOOSTRIX     TAKE these medications   amLODipine 5 MG tablet Commonly known as:  NORVASC Take 5 mg by mouth daily.   apixaban 5 MG Tabs tablet Commonly known as:  ELIQUIS  Take 1 tablet (5 mg total) by mouth 2 (two) times daily. take apixaban 2 tablet twice a day for 7 days then take 1 tablet twice a day   benazepril 40 MG tablet Commonly known as:  LOTENSIN TAKE ONE TABLET BY MOUTH ONCE DAILY   carvedilol 25 MG tablet Commonly known as:  COREG Take 1 tablet (25 mg total) by mouth 2 (two) times daily. What changed:  additional  instructions   clotrimazole 1 % cream Commonly known as:  LOTRIMIN Apply 1 application topically 2 (two) times daily.   diazepam 2 MG tablet Commonly known as:  VALIUM Take 1 tablet by mouth 3 (three) times daily.   glipiZIDE 5 MG tablet Commonly known as:  GLUCOTROL Take 1 tablet (5 mg total) by mouth daily before breakfast.   glucose blood test strip Test blood glucose one to four times daily as directed.   hydrochlorothiazide 25 MG tablet Commonly known as:  HYDRODIURIL TAKE ONE TABLET BY MOUTH ONCE DAILY   polyethylene glycol powder powder Commonly known as:  GLYCOLAX/MIRALAX Take 17 g by mouth 2 (two) times daily as needed.            Durable Medical Equipment  (From admission, onward)        Start     Ordered   06/29/17 1421  DME Oxygen  Once    Question Answer Comment  Mode or (Route) Nasal cannula   Liters per Minute 2   Frequency Continuous (stationary and portable oxygen unit needed)   Oxygen delivery system Gas      06/29/17 1422   06/29/17 1338  For home use only DME Walker rolling  Once    Question:  Patient needs a walker to treat with the following condition  Answer:  DVT (deep vein thrombosis) in pregnancy (HCC)   06/29/17 1338   06/29/17 1113  For home use only DME oxygen  Once    Question Answer Comment  Mode or (Route) Nasal cannula   Liters per Minute 2   Frequency Continuous (stationary and portable oxygen unit needed)   Oxygen delivery system Gas      06/29/17 1112     Allergies  Allergen Reactions  . Cymbalta [Duloxetine Hcl] Other (See Comments)    Disoriented, bad feelings, doesn't seem to work for patient      The results of significant diagnostics from this hospitalization (including imaging, microbiology, ancillary and laboratory) are listed below for reference.    Significant Diagnostic Studies: Dg Chest 2 View  Result Date: 06/29/2017 CLINICAL DATA:  Hypoxia, diabetes, hypertension EXAM: CHEST  2 VIEW COMPARISON:   06/25/2017 FINDINGS: Minor basilar atelectasis. No significant airspace process, pneumonia, collapse or consolidation. Negative for edema, effusion or pneumothorax. Trachea is midline. Normal heart size and vascularity. Degenerative changes noted of the spine. IMPRESSION: Slight increased basilar atelectasis.  No other acute process. Electronically Signed   By: Judie Petit.  Shick M.D.   On: 06/29/2017 08:29   Dg Chest 2 View  Result Date: 06/25/2017 CLINICAL DATA:  Shortness of breath.  Cough and dizziness. EXAM: CHEST  2 VIEW COMPARISON:  02/06/2016 FINDINGS: The heart size and mediastinal contours are within normal limits. Both lungs are clear. The visualized skeletal structures are unremarkable. IMPRESSION: No active cardiopulmonary disease. Electronically Signed   By: Signa Kell M.D.   On: 06/25/2017 11:12   Nm Pulmonary Vent And Perf (v/q Scan)  Result Date: 06/25/2017 CLINICAL DATA:  61 year old male with nonproductive cough, right lower extremity  swelling, tachycardia. EXAM: NUCLEAR MEDICINE VENTILATION - PERFUSION LUNG SCAN TECHNIQUE: Ventilation images were obtained in multiple projections using inhaled aerosol Tc-5350m DTPA. Perfusion images were obtained in multiple projections after intravenous injection of Tc-6850m MAA. RADIOPHARMACEUTICALS:  32.2 mCi Technetium-3650m DTPA aerosol inhalation and 4.3 mCi Technetium-4950m MAA IV COMPARISON:  Chest radiographs 0941 hours today. FINDINGS: Ventilation: Mildly asymmetric ventilation radiotracer distribution, more so into the left lung. Eventration or elevation of the hemidiaphragms again noted. No ventilation defect. Perfusion: More homogeneous radiotracer activity in both lungs. Smooth linear photopenic area present across the left lung. No other perfusion defect identified. IMPRESSION: Low probability of acute pulmonary embolus. Electronically Signed   By: Odessa FlemingH  Hall M.D.   On: 06/25/2017 13:23    Microbiology: No results found for this or any previous visit  (from the past 240 hour(s)).   Labs: Basic Metabolic Panel: Recent Labs  Lab 06/25/17 0936 06/26/17 0319 06/27/17 0623 06/28/17 0448 06/29/17 0825  NA 132* 130* 133* 135 134*  K 3.7 3.9 3.8 3.5 3.8  CL 98* 101 99* 102 100*  CO2 23 20* 24 23 25   GLUCOSE 215* 224* 155* 132* 156*  BUN 17 23* 30* 26* 19  CREATININE 1.71* 1.85* 2.10* 1.69* 1.56*  CALCIUM 9.1 8.6* 8.8* 8.7* 8.8*   Liver Function Tests: Recent Labs  Lab 06/26/17 0319  AST 42*  ALT 36  ALKPHOS 60  BILITOT 0.8  PROT 9.1*  ALBUMIN 3.3*   No results for input(s): LIPASE, AMYLASE in the last 168 hours. No results for input(s): AMMONIA in the last 168 hours. CBC: Recent Labs  Lab 06/25/17 0936 06/26/17 0319 06/27/17 0623 06/28/17 0448 06/29/17 0825  WBC 8.2 9.0 9.1 9.1 7.8  HGB 15.2 13.9 13.6 13.7 13.8  HCT 44.6 40.9 40.5 40.3 40.6  MCV 82.6 81.5 81.8 81.6 82.4  PLT 275 245 220 253 268   Cardiac Enzymes: Recent Labs  Lab 06/25/17 1502 06/25/17 2217 06/26/17 0319 06/26/17 0800  TROPONINI 0.57* 0.46* 0.44* 0.43*   BNP: BNP (last 3 results) Recent Labs    06/25/17 0936  BNP 336.0*    ProBNP (last 3 results) No results for input(s): PROBNP in the last 8760 hours.  CBG: Recent Labs  Lab 06/28/17 1355 06/28/17 1636 06/28/17 2123 06/29/17 0739 06/29/17 1202  GLUCAP 223* 127* 187* 154* 188*       Signed:  Blessen Kimbrough N  [facility] 06/29/2017, 2:22 PM

## 2017-06-29 NOTE — Progress Notes (Signed)
Reviewed discharge instructions/medications with pt. Pt is stable and ready for discharge.  He understands how to take his Eliquis.  Oxygen has been delivered to pt's room for home oxygen use.  Pt is waiting on rolling walker to be delivered to the room before he leaves.

## 2017-06-29 NOTE — Progress Notes (Signed)
SATURATION QUALIFICATIONS: (This note is used to comply with regulatory documentation for home oxygen)  Patient Saturations on Room Air at Rest = 94%  Patient Saturations on Room Air while Ambulating = 87%  Patient Saturations on 96% on 2L of oxygen while ambulating.   Please briefly explain why patient needs home oxygen: Patient's oxygen saturation is dropping while ambulating on room air.

## 2017-06-29 NOTE — Progress Notes (Addendum)
Patient going home on home oxygen; dan with [facility] called for arrangements- oxygen to be delivered to the room today prior to going home.Eliquis coupon card given to patient with explanation of usage; all questions answered. Abelino DerrickB Magnum Lunde Okc[facility] Specialty HospitalRN,MHA,BSN [phone]  1:38 pm - Rolling walker ordered for home and to be delivered to the room today prior to going home. Abelino DerrickB Zamier Eggebrecht RN,MHA,BSN

## 2017-07-10 ENCOUNTER — Other Ambulatory Visit: Payer: Self-pay | Admitting: Family Medicine

## 2017-07-10 DIAGNOSIS — I10 Essential (primary) hypertension: Secondary | ICD-10-CM

## 2017-07-14 IMAGING — DX DG CHEST 2V
2 series · 2 of 2 positions shown · non-contrast
Comparison: 09/24/14

CLINICAL DATA: Central chest pain

EXAM:
CHEST - 2 VIEW

[w chest pa]
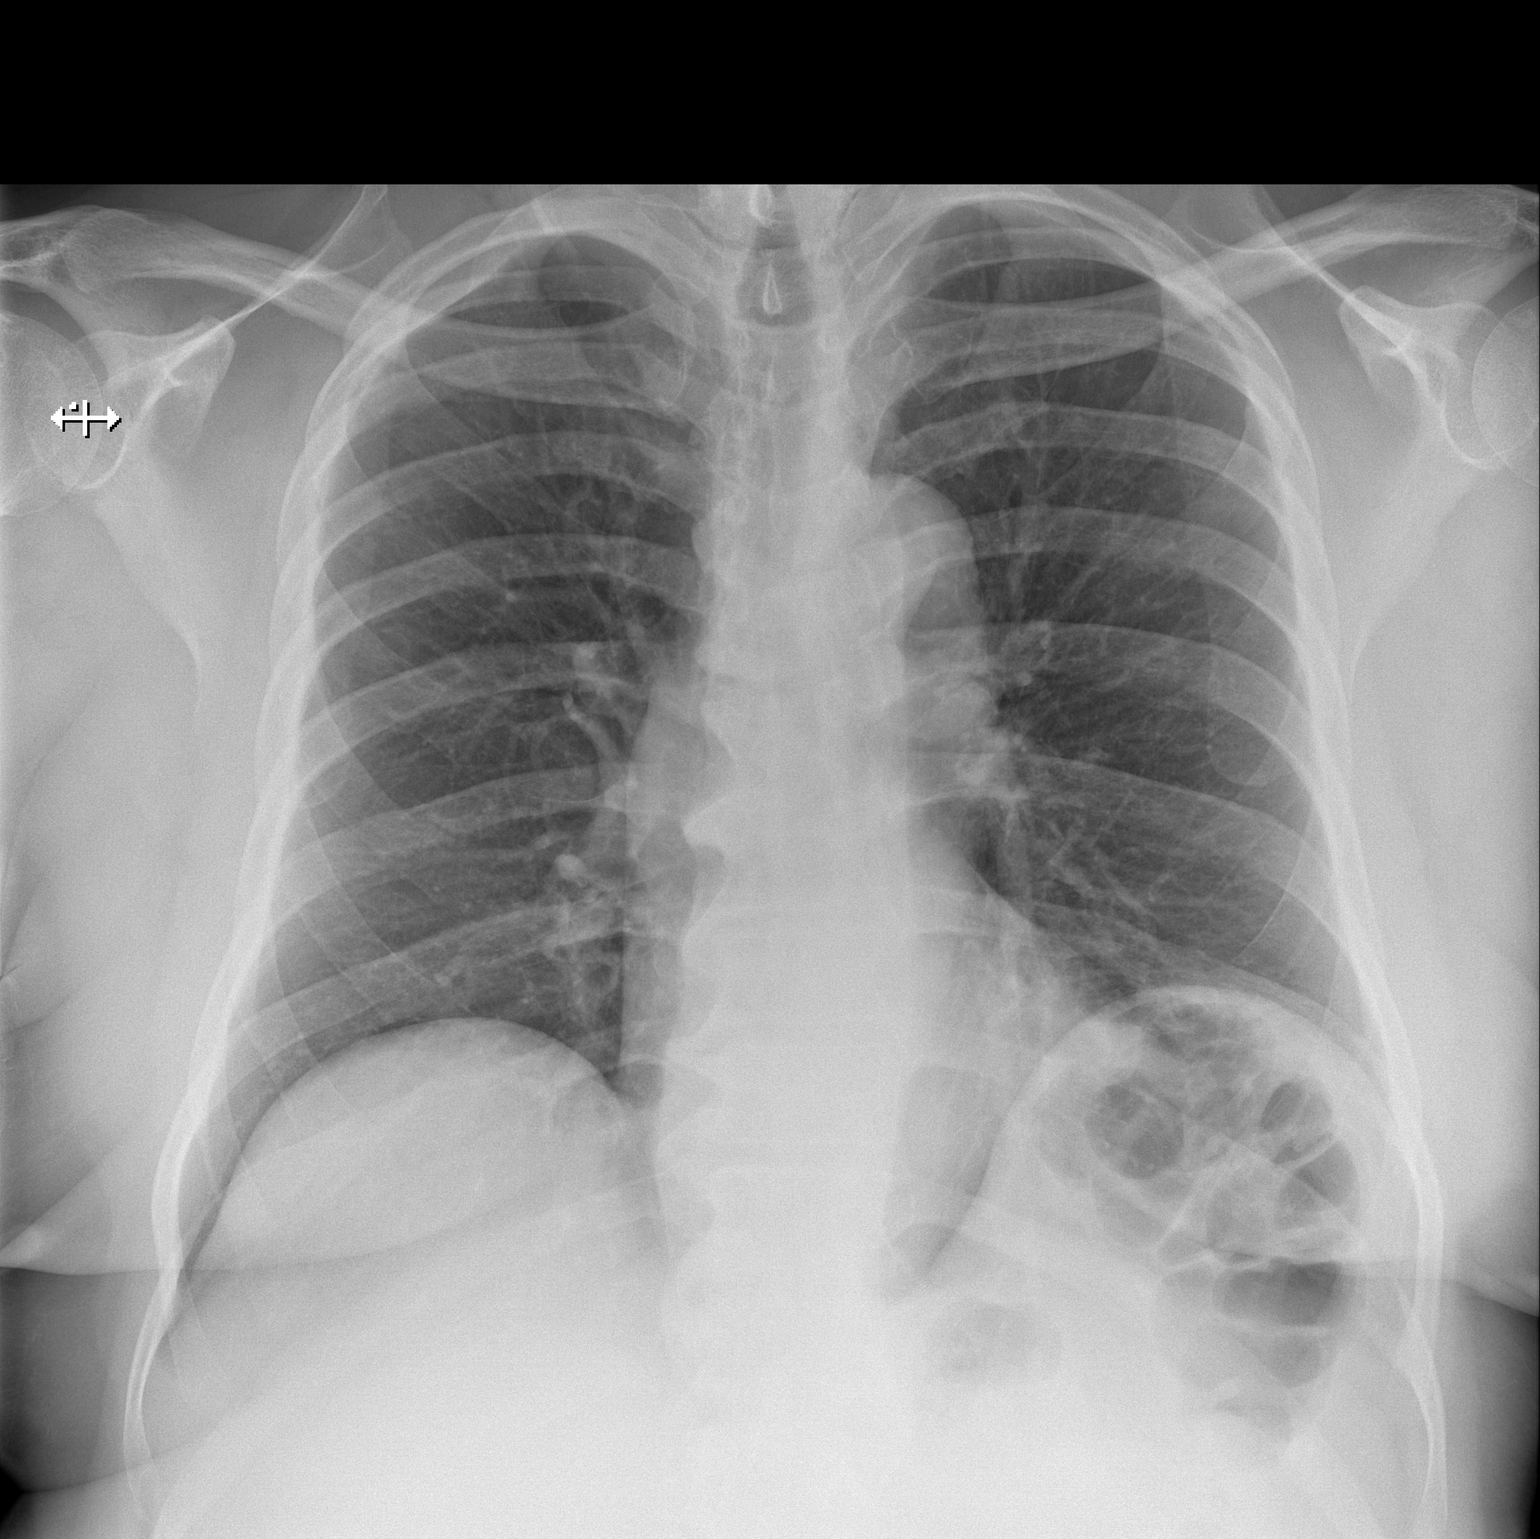

[w chest lat]
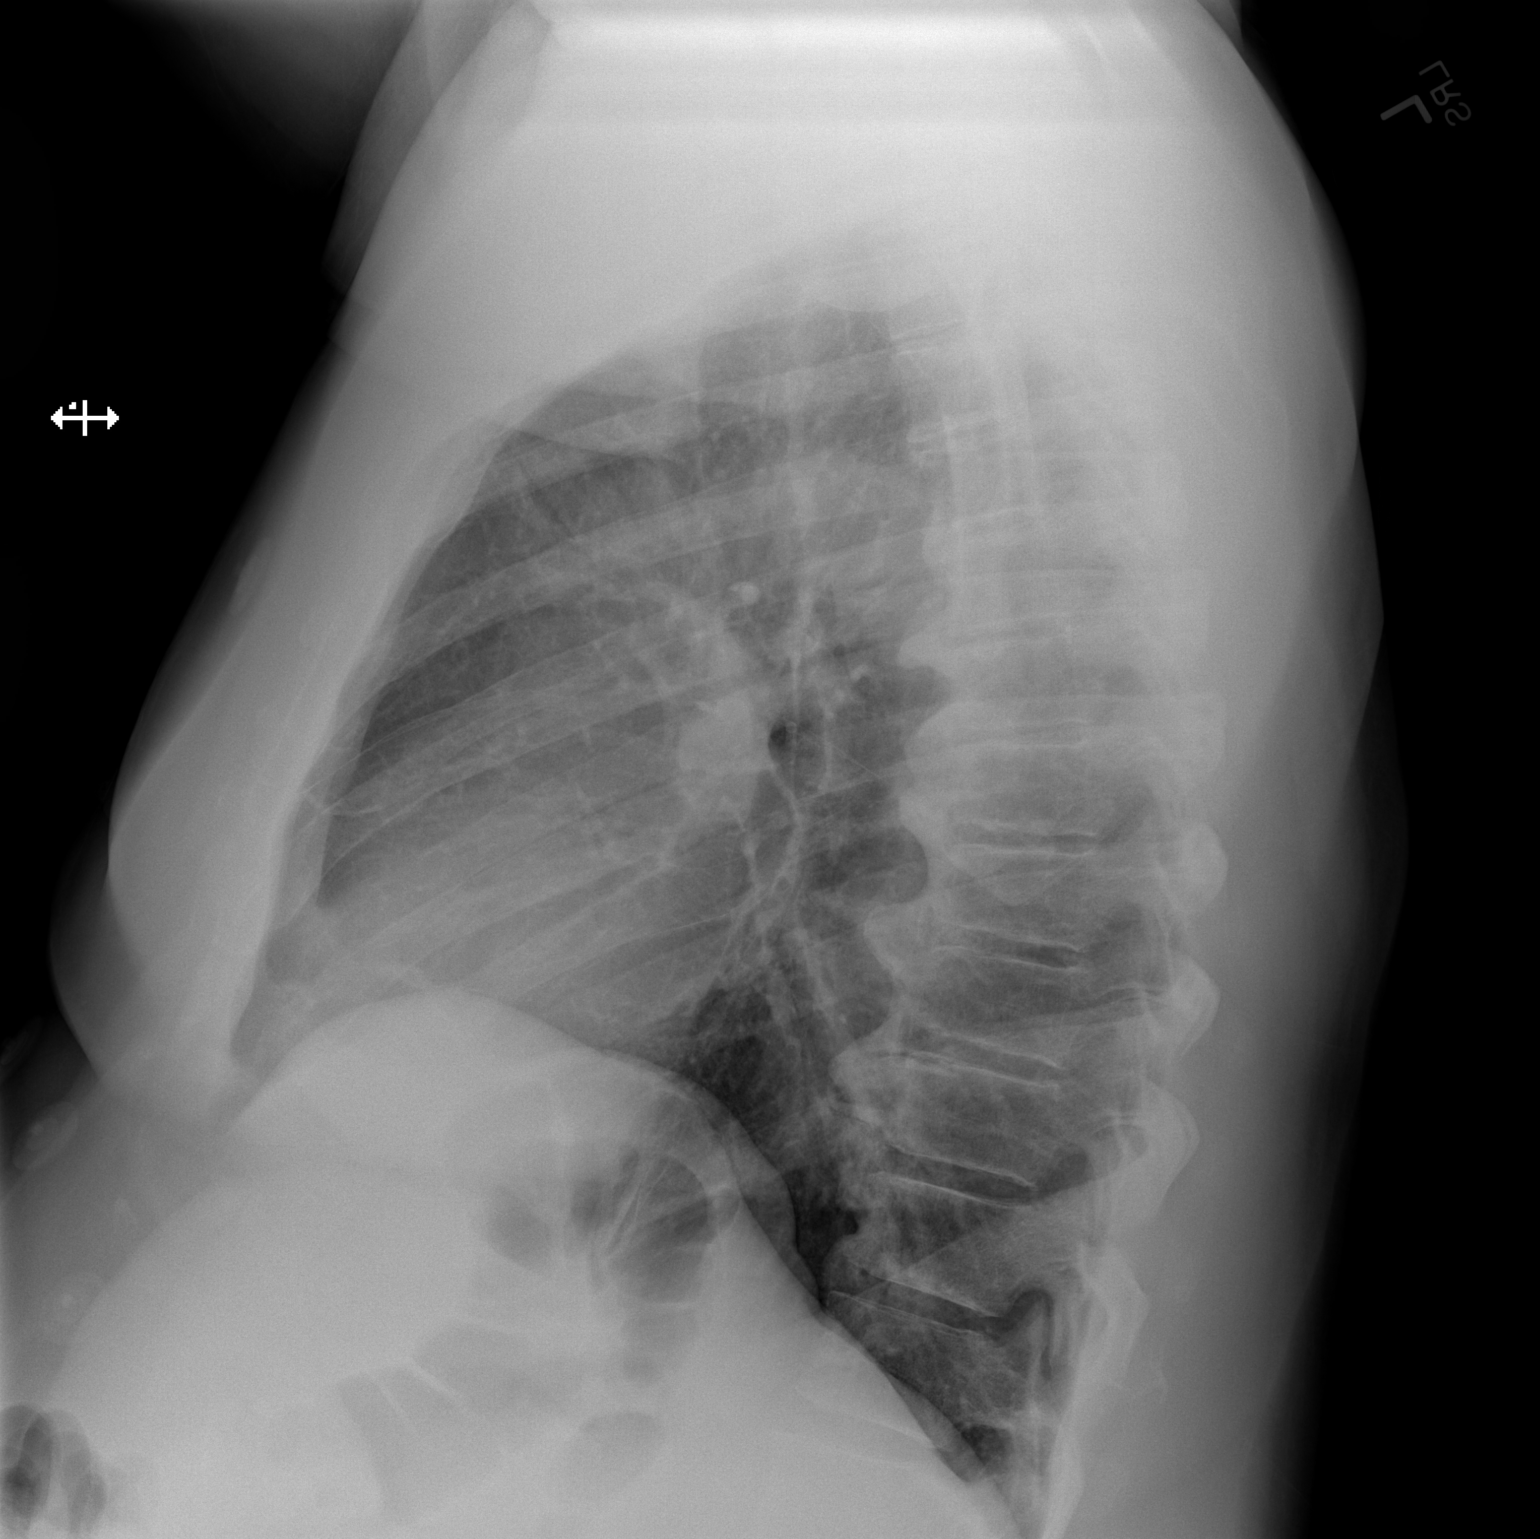

[2 of 2 positions shown; findings below may reference images not displayed]

FINDINGS: Cardiac shadow is within normal limits. The lungs are well aerated
bilaterally. Multilevel degenerative changes of the thoracic spine
are again seen.
IMPRESSION: No acute abnormality noted.

## 2017-07-15 ENCOUNTER — Other Ambulatory Visit: Payer: Self-pay | Admitting: Family Medicine

## 2017-07-15 DIAGNOSIS — I10 Essential (primary) hypertension: Secondary | ICD-10-CM

## 2017-07-17 ENCOUNTER — Other Ambulatory Visit: Payer: Self-pay | Admitting: Family Medicine

## 2017-07-17 DIAGNOSIS — I10 Essential (primary) hypertension: Secondary | ICD-10-CM

## 2017-07-25 ENCOUNTER — Other Ambulatory Visit: Payer: Self-pay | Admitting: Family Medicine

## 2017-07-25 DIAGNOSIS — I10 Essential (primary) hypertension: Secondary | ICD-10-CM

## 2017-08-14 ENCOUNTER — Other Ambulatory Visit: Payer: Self-pay | Admitting: Internal Medicine

## 2017-08-14 DIAGNOSIS — R079 Chest pain, unspecified: Secondary | ICD-10-CM

## 2017-08-14 DIAGNOSIS — E785 Hyperlipidemia, unspecified: Secondary | ICD-10-CM

## 2017-08-14 DIAGNOSIS — I10 Essential (primary) hypertension: Secondary | ICD-10-CM

## 2017-08-14 NOTE — Telephone Encounter (Signed)
Please review for refill. Thanks!  

## 2017-08-20 ENCOUNTER — Other Ambulatory Visit: Payer: Self-pay | Admitting: Internal Medicine

## 2017-08-20 DIAGNOSIS — R079 Chest pain, unspecified: Secondary | ICD-10-CM

## 2017-08-20 DIAGNOSIS — I10 Essential (primary) hypertension: Secondary | ICD-10-CM

## 2017-08-20 DIAGNOSIS — E785 Hyperlipidemia, unspecified: Secondary | ICD-10-CM

## 2017-08-21 NOTE — Telephone Encounter (Signed)
Please review for refill, Thanks !  

## 2017-10-19 ENCOUNTER — Other Ambulatory Visit (HOSPITAL_COMMUNITY): Payer: Self-pay | Admitting: Respiratory Therapy

## 2017-10-19 DIAGNOSIS — R0602 Shortness of breath: Secondary | ICD-10-CM

## 2017-10-19 DIAGNOSIS — R0902 Hypoxemia: Secondary | ICD-10-CM

## 2017-10-27 ENCOUNTER — Ambulatory Visit (HOSPITAL_COMMUNITY)
Admission: RE | Admit: 2017-10-27 | Discharge: 2017-10-27 | Disposition: A | Discharge status: Home / Self Care | Payer: Medicare Other | Source: Ambulatory Visit | Attending: Internal Medicine | Admitting: Internal Medicine

## 2017-10-27 DIAGNOSIS — R0902 Hypoxemia: Secondary | ICD-10-CM | POA: Insufficient documentation

## 2017-10-27 DIAGNOSIS — R0602 Shortness of breath: Secondary | ICD-10-CM | POA: Diagnosis not present

## 2017-10-27 LAB — PULMONARY FUNCTION TEST
DL/VA % PRED: 81 %
DL/VA: 3.99 ml/min/mmHg/L
DLCO UNC % PRED: 69 %
DLCO UNC: 27.09 ml/min/mmHg
FEF 25-75 Pre: 6.44 L/sec
FEF2575-%Pred-Pre: 199 %
FEV1-%Pred-Pre: 123 %
FEV1-Pre: 4.52 L
FEV1FVC-%PRED-PRE: 116 %
FEV6-%PRED-PRE: 111 %
FEV6-Pre: 5.06 L
FEV6FVC-%PRED-PRE: 104 %
FVC-%PRED-PRE: 107 %
FVC-PRE: 5.06 L
PRE FEV6/FVC RATIO: 100 %
Pre FEV1/FVC ratio: 89 %
RV % PRED: 87 %
RV: 2.26 L
TLC % PRED: 92 %
TLC: 7.43 L

## 2018-05-01 IMAGING — DX DG CHEST 2V
2 series · 2 of 2 positions shown · non-contrast
Comparison: 04/21/2015

CLINICAL DATA: Left-sided chest pain for 3 days.

EXAM:
CHEST  2 VIEW

[chest pa]
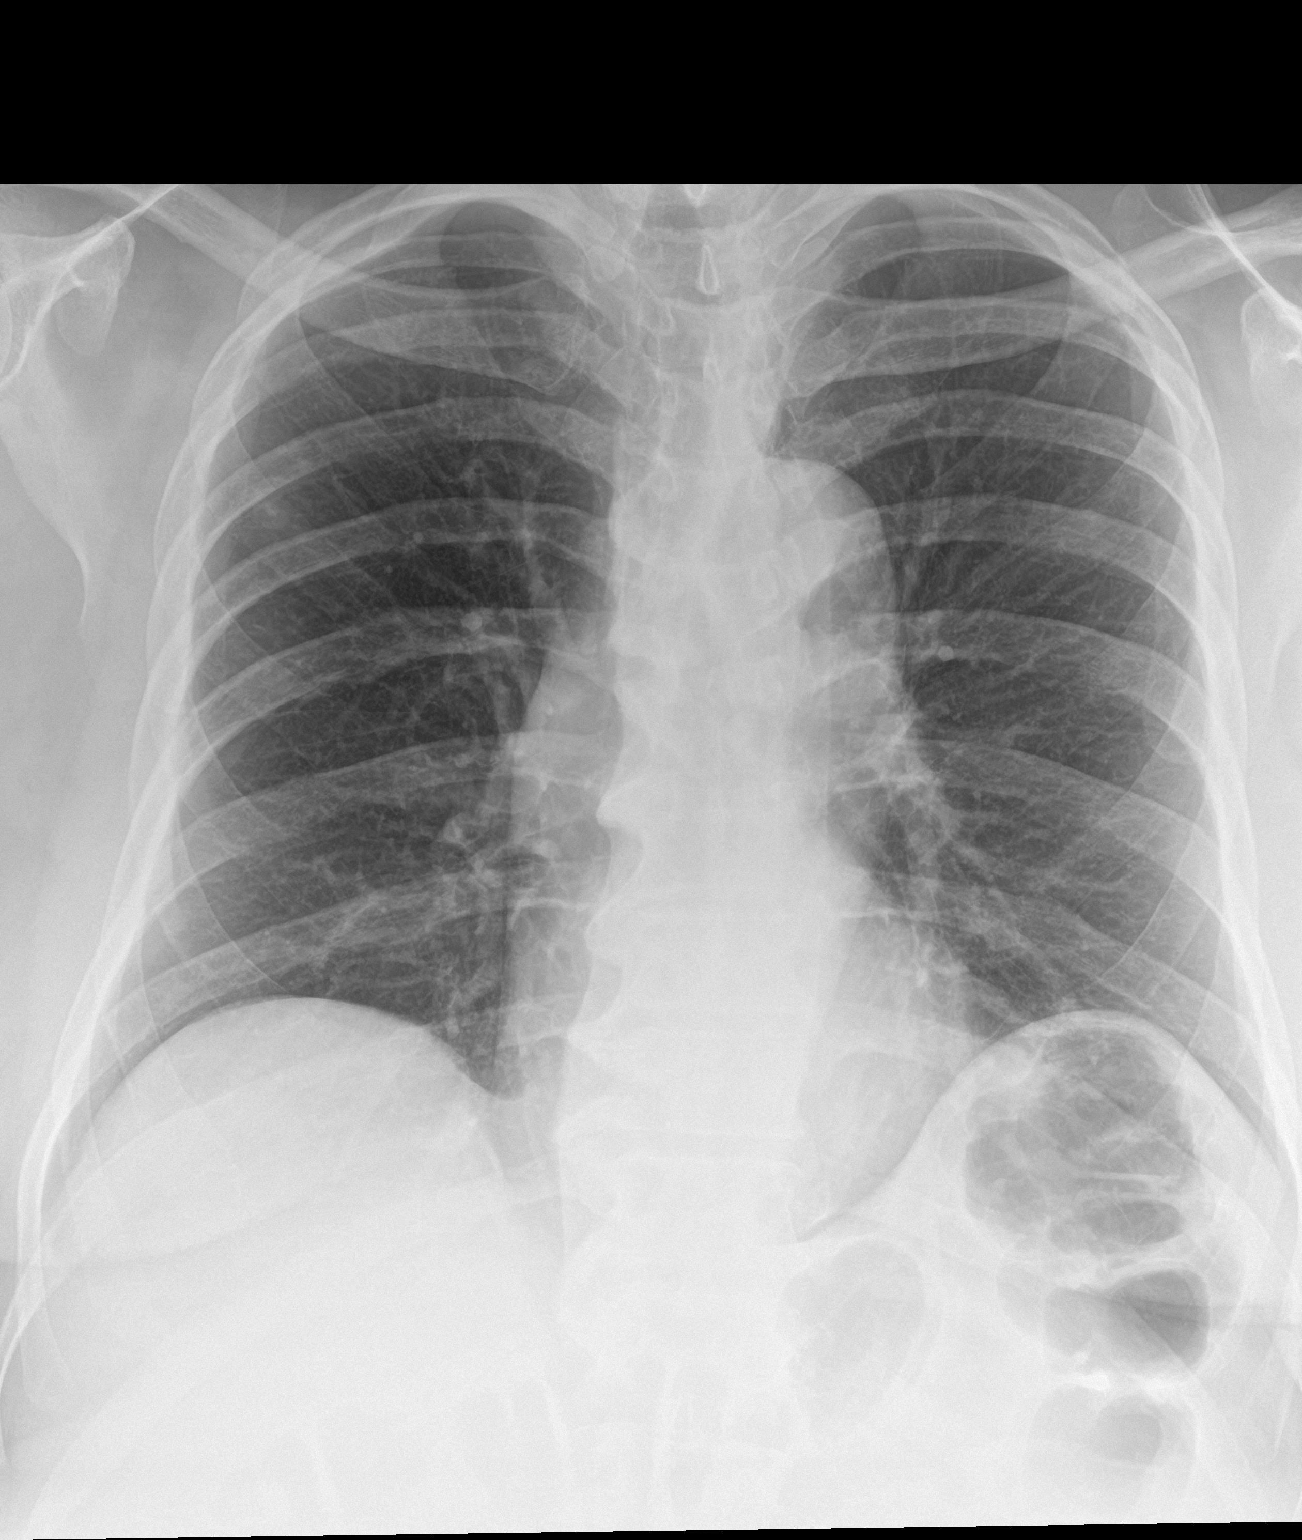

[chest lat]
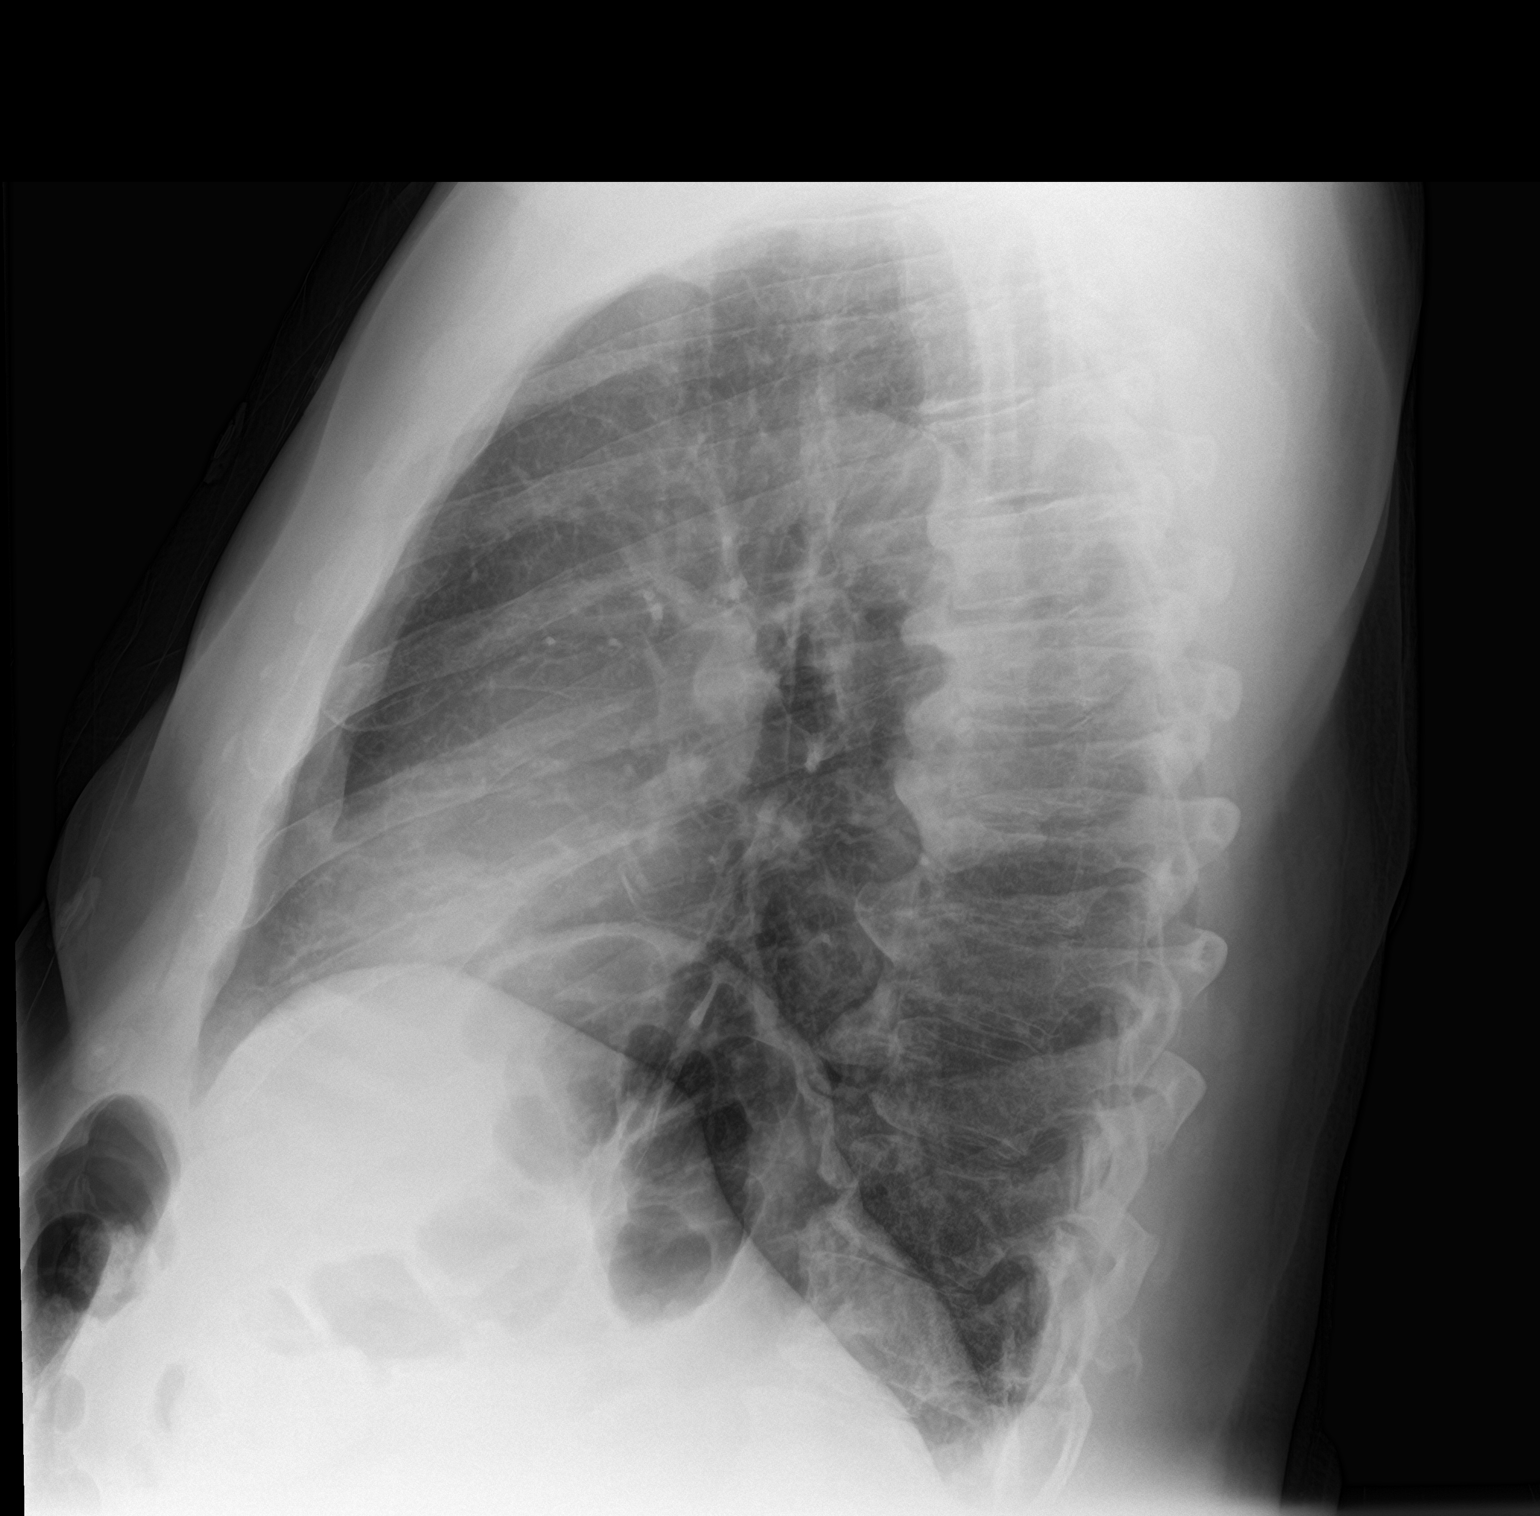

[2 of 2 positions shown; findings below may reference images not displayed]

FINDINGS: Heart and mediastinal contours are within normal limits. No focal
opacities or effusions. No acute bony abnormality.
IMPRESSION: No active cardiopulmonary disease.

## 2018-10-10 DEATH — deceased
